# Patient Record
Sex: Female | Born: 1940
Health system: Southern US, Community
[De-identification: ages and names within clinical notes are randomized; demographics above are authoritative.]

## PROBLEM LIST (undated history)

## (undated) DIAGNOSIS — S62309A Unspecified fracture of unspecified metacarpal bone, initial encounter for closed fracture: Secondary | ICD-10-CM

## (undated) DIAGNOSIS — Z8614 Personal history of Methicillin resistant Staphylococcus aureus infection: Secondary | ICD-10-CM

## (undated) DIAGNOSIS — I219 Acute myocardial infarction, unspecified: Secondary | ICD-10-CM

## (undated) DIAGNOSIS — D8989 Other specified disorders involving the immune mechanism, not elsewhere classified: Secondary | ICD-10-CM

## (undated) DIAGNOSIS — F419 Anxiety disorder, unspecified: Secondary | ICD-10-CM

## (undated) DIAGNOSIS — R1310 Dysphagia, unspecified: Secondary | ICD-10-CM

## (undated) DIAGNOSIS — L568 Other specified acute skin changes due to ultraviolet radiation: Secondary | ICD-10-CM

## (undated) DIAGNOSIS — Z800 Family history of malignant neoplasm of digestive organs: Secondary | ICD-10-CM

## (undated) DIAGNOSIS — Z8719 Personal history of other diseases of the digestive system: Secondary | ICD-10-CM

## (undated) DIAGNOSIS — K219 Gastro-esophageal reflux disease without esophagitis: Secondary | ICD-10-CM

## (undated) DIAGNOSIS — E079 Disorder of thyroid, unspecified: Secondary | ICD-10-CM

## (undated) DIAGNOSIS — M19041 Primary osteoarthritis, right hand: Secondary | ICD-10-CM

## (undated) DIAGNOSIS — G709 Myoneural disorder, unspecified: Secondary | ICD-10-CM

## (undated) DIAGNOSIS — Q245 Malformation of coronary vessels: Secondary | ICD-10-CM

## (undated) DIAGNOSIS — M19042 Primary osteoarthritis, left hand: Secondary | ICD-10-CM

## (undated) DIAGNOSIS — G8929 Other chronic pain: Secondary | ICD-10-CM

## (undated) DIAGNOSIS — M3501 Sicca syndrome with keratoconjunctivitis: Secondary | ICD-10-CM

## (undated) DIAGNOSIS — F32A Depression, unspecified: Secondary | ICD-10-CM

## (undated) DIAGNOSIS — Z8639 Personal history of other endocrine, nutritional and metabolic disease: Secondary | ICD-10-CM

## (undated) DIAGNOSIS — Q249 Congenital malformation of heart, unspecified: Secondary | ICD-10-CM

## (undated) DIAGNOSIS — I4891 Unspecified atrial fibrillation: Secondary | ICD-10-CM

## (undated) DIAGNOSIS — I10 Essential (primary) hypertension: Secondary | ICD-10-CM

## (undated) DIAGNOSIS — IMO0002 Reserved for concepts with insufficient information to code with codable children: Secondary | ICD-10-CM

## (undated) DIAGNOSIS — C50919 Malignant neoplasm of unspecified site of unspecified female breast: Secondary | ICD-10-CM

## (undated) DIAGNOSIS — R197 Diarrhea, unspecified: Secondary | ICD-10-CM

## (undated) DIAGNOSIS — K1379 Other lesions of oral mucosa: Secondary | ICD-10-CM

## (undated) DIAGNOSIS — R51 Headache: Secondary | ICD-10-CM

## (undated) DIAGNOSIS — I480 Paroxysmal atrial fibrillation: Secondary | ICD-10-CM

## (undated) DIAGNOSIS — R519 Headache, unspecified: Secondary | ICD-10-CM

## (undated) DIAGNOSIS — F329 Major depressive disorder, single episode, unspecified: Secondary | ICD-10-CM

## (undated) DIAGNOSIS — Z9889 Other specified postprocedural states: Secondary | ICD-10-CM

## (undated) DIAGNOSIS — I7300 Raynaud's syndrome without gangrene: Secondary | ICD-10-CM

## (undated) DIAGNOSIS — K625 Hemorrhage of anus and rectum: Secondary | ICD-10-CM

## (undated) DIAGNOSIS — M8589 Other specified disorders of bone density and structure, multiple sites: Secondary | ICD-10-CM

## (undated) DIAGNOSIS — M1990 Unspecified osteoarthritis, unspecified site: Secondary | ICD-10-CM

## (undated) DIAGNOSIS — I2510 Atherosclerotic heart disease of native coronary artery without angina pectoris: Secondary | ICD-10-CM

## (undated) HISTORY — DX: Unspecified atrial fibrillation: I48.91

## (undated) HISTORY — DX: Malformation of coronary vessels: Q24.5

## (undated) HISTORY — DX: Personal history of other endocrine, nutritional and metabolic disease: Z86.39

## (undated) HISTORY — PX: COLONOSCOPY: SHX174

## (undated) HISTORY — DX: Headache, unspecified: R51.9

## (undated) HISTORY — PX: UPPER GASTROINTESTINAL ENDOSCOPY: SHX188

## (undated) HISTORY — DX: Family history of malignant neoplasm of digestive organs: Z80.0

## (undated) HISTORY — PX: OTHER SURGICAL HISTORY: SHX169

## (undated) HISTORY — DX: Other specified acute skin changes due to ultraviolet radiation: L56.8

## (undated) HISTORY — DX: Essential (primary) hypertension: I10

## (undated) HISTORY — DX: Other specified disorders involving the immune mechanism, not elsewhere classified: D89.89

## (undated) HISTORY — DX: Other specified disorders of bone density and structure, multiple sites: M85.89

## (undated) HISTORY — DX: Reserved for concepts with insufficient information to code with codable children: IMO0002

## (undated) HISTORY — DX: Malignant neoplasm of unspecified site of unspecified female breast: C50.919

## (undated) HISTORY — DX: Depression, unspecified: F32.A

## (undated) HISTORY — DX: Major depressive disorder, single episode, unspecified: F32.9

## (undated) HISTORY — DX: Other specified postprocedural states: Z98.890

## (undated) HISTORY — DX: Personal history of Methicillin resistant Staphylococcus aureus infection: Z86.14

## (undated) HISTORY — DX: Acute myocardial infarction, unspecified: I21.9

## (undated) HISTORY — DX: Headache: R51

## (undated) HISTORY — DX: Other lesions of oral mucosa: K13.79

## (undated) HISTORY — DX: Personal history of other diseases of the digestive system: Z87.19

## (undated) HISTORY — DX: Gastro-esophageal reflux disease without esophagitis: K21.9

## (undated) HISTORY — DX: Primary osteoarthritis, left hand: M19.042

## (undated) HISTORY — DX: Unspecified osteoarthritis, unspecified site: M19.90

## (undated) HISTORY — DX: Primary osteoarthritis, right hand: M19.041

## (undated) HISTORY — DX: Paroxysmal atrial fibrillation: I48.0

## (undated) HISTORY — DX: Unspecified fracture of unspecified metacarpal bone, initial encounter for closed fracture: S62.309A

## (undated) HISTORY — DX: Sicca syndrome with keratoconjunctivitis: M35.01

## (undated) HISTORY — PX: TIBIA FRACTURE SURGERY: SHX806

## (undated) HISTORY — DX: Disorder of thyroid, unspecified: E07.9

## (undated) HISTORY — PX: APPENDECTOMY: SHX54

## (undated) HISTORY — DX: Dysphagia, unspecified: R13.10

## (undated) HISTORY — DX: Other chronic pain: G89.29

## (undated) HISTORY — DX: Raynaud's syndrome without gangrene: I73.00

## (undated) HISTORY — DX: Hemorrhage of anus and rectum: K62.5

## (undated) HISTORY — DX: Atherosclerotic heart disease of native coronary artery without angina pectoris: I25.10

## (undated) HISTORY — DX: Diarrhea, unspecified: R19.7

## (undated) HISTORY — DX: Anxiety disorder, unspecified: F41.9

## (undated) HISTORY — PX: CHOLECYSTECTOMY: SHX55

## (undated) HISTORY — PX: WRIST FRACTURE SURGERY: SHX121

## (undated) HISTORY — DX: Congenital malformation of heart, unspecified: Q24.9

## (undated) HISTORY — DX: Myoneural disorder, unspecified: G70.9

---

## 1988-07-26 HISTORY — PX: PARTIAL HYSTERECTOMY: SHX80

## 1998-02-03 ENCOUNTER — Other Ambulatory Visit: Admission: RE | Admit: 1998-02-03 | Discharge: 1998-02-03 | Payer: Self-pay | Admitting: Hematology and Oncology

## 1998-02-24 ENCOUNTER — Ambulatory Visit (HOSPITAL_BASED_OUTPATIENT_CLINIC_OR_DEPARTMENT_OTHER): Admission: RE | Admit: 1998-02-24 | Discharge: 1998-02-24 | Payer: Self-pay | Admitting: Plastic Surgery

## 1998-03-13 ENCOUNTER — Inpatient Hospital Stay (HOSPITAL_COMMUNITY): Admission: AD | Admit: 1998-03-13 | Discharge: 1998-03-20 | Payer: Self-pay | Admitting: Plastic Surgery

## 1998-04-30 ENCOUNTER — Other Ambulatory Visit: Admission: RE | Admit: 1998-04-30 | Discharge: 1998-04-30 | Payer: Self-pay | Admitting: Obstetrics & Gynecology

## 1998-11-10 ENCOUNTER — Ambulatory Visit (HOSPITAL_COMMUNITY): Admission: RE | Admit: 1998-11-10 | Discharge: 1998-11-10 | Payer: Self-pay | Admitting: Obstetrics & Gynecology

## 1999-03-06 ENCOUNTER — Ambulatory Visit (HOSPITAL_BASED_OUTPATIENT_CLINIC_OR_DEPARTMENT_OTHER): Admission: RE | Admit: 1999-03-06 | Discharge: 1999-03-06 | Payer: Self-pay | Admitting: Plastic Surgery

## 1999-05-19 ENCOUNTER — Other Ambulatory Visit: Admission: RE | Admit: 1999-05-19 | Discharge: 1999-05-19 | Payer: Self-pay | Admitting: Obstetrics & Gynecology

## 2000-06-03 ENCOUNTER — Other Ambulatory Visit: Admission: RE | Admit: 2000-06-03 | Discharge: 2000-06-03 | Payer: Self-pay | Admitting: Obstetrics & Gynecology

## 2000-08-14 ENCOUNTER — Inpatient Hospital Stay (HOSPITAL_COMMUNITY): Admission: EM | Admit: 2000-08-14 | Discharge: 2000-08-17 | Payer: Self-pay | Admitting: *Deleted

## 2000-08-14 ENCOUNTER — Encounter: Payer: Self-pay | Admitting: Emergency Medicine

## 2001-05-09 ENCOUNTER — Ambulatory Visit (HOSPITAL_COMMUNITY): Admission: RE | Admit: 2001-05-09 | Discharge: 2001-05-09 | Payer: Self-pay | Admitting: Neurology

## 2001-05-12 ENCOUNTER — Encounter: Payer: Self-pay | Admitting: Gastroenterology

## 2001-05-12 ENCOUNTER — Ambulatory Visit (HOSPITAL_COMMUNITY): Admission: RE | Admit: 2001-05-12 | Discharge: 2001-05-12 | Payer: Self-pay | Admitting: Gastroenterology

## 2001-05-12 ENCOUNTER — Encounter (INDEPENDENT_AMBULATORY_CARE_PROVIDER_SITE_OTHER): Payer: Self-pay | Admitting: *Deleted

## 2001-07-12 ENCOUNTER — Other Ambulatory Visit: Admission: RE | Admit: 2001-07-12 | Discharge: 2001-07-12 | Payer: Self-pay | Admitting: Obstetrics & Gynecology

## 2003-08-19 ENCOUNTER — Other Ambulatory Visit: Admission: RE | Admit: 2003-08-19 | Discharge: 2003-08-19 | Payer: Self-pay | Admitting: Obstetrics & Gynecology

## 2004-03-18 ENCOUNTER — Emergency Department (HOSPITAL_COMMUNITY): Admission: EM | Admit: 2004-03-18 | Discharge: 2004-03-18 | Payer: Self-pay | Admitting: Emergency Medicine

## 2005-02-08 ENCOUNTER — Ambulatory Visit: Payer: Self-pay | Admitting: Gastroenterology

## 2005-02-18 ENCOUNTER — Ambulatory Visit: Payer: Self-pay | Admitting: Gastroenterology

## 2005-02-26 ENCOUNTER — Ambulatory Visit: Payer: Self-pay | Admitting: Gastroenterology

## 2005-02-26 ENCOUNTER — Encounter (INDEPENDENT_AMBULATORY_CARE_PROVIDER_SITE_OTHER): Payer: Self-pay | Admitting: *Deleted

## 2005-02-26 ENCOUNTER — Encounter (INDEPENDENT_AMBULATORY_CARE_PROVIDER_SITE_OTHER): Payer: Self-pay | Admitting: Specialist

## 2005-04-01 ENCOUNTER — Ambulatory Visit: Payer: Self-pay | Admitting: Gastroenterology

## 2005-08-27 ENCOUNTER — Other Ambulatory Visit: Admission: RE | Admit: 2005-08-27 | Discharge: 2005-08-27 | Payer: Self-pay | Admitting: Obstetrics & Gynecology

## 2006-05-31 ENCOUNTER — Encounter: Admission: RE | Admit: 2006-05-31 | Discharge: 2006-05-31 | Payer: Self-pay | Admitting: Internal Medicine

## 2006-06-06 ENCOUNTER — Encounter: Admission: RE | Admit: 2006-06-06 | Discharge: 2006-06-06 | Payer: Self-pay | Admitting: Internal Medicine

## 2006-06-06 ENCOUNTER — Other Ambulatory Visit: Admission: RE | Admit: 2006-06-06 | Discharge: 2006-06-06 | Payer: Self-pay | Admitting: Interventional Radiology

## 2006-06-06 ENCOUNTER — Encounter (INDEPENDENT_AMBULATORY_CARE_PROVIDER_SITE_OTHER): Payer: Self-pay | Admitting: Specialist

## 2007-11-22 ENCOUNTER — Encounter: Admission: RE | Admit: 2007-11-22 | Discharge: 2007-11-22 | Payer: Self-pay | Admitting: Surgery

## 2009-05-02 ENCOUNTER — Ambulatory Visit: Payer: Self-pay | Admitting: Gastroenterology

## 2009-05-02 DIAGNOSIS — R1310 Dysphagia, unspecified: Secondary | ICD-10-CM

## 2009-05-02 DIAGNOSIS — C50919 Malignant neoplasm of unspecified site of unspecified female breast: Secondary | ICD-10-CM | POA: Insufficient documentation

## 2009-05-02 DIAGNOSIS — I2510 Atherosclerotic heart disease of native coronary artery without angina pectoris: Secondary | ICD-10-CM

## 2009-05-02 HISTORY — DX: Dysphagia, unspecified: R13.10

## 2009-05-02 HISTORY — DX: Atherosclerotic heart disease of native coronary artery without angina pectoris: I25.10

## 2009-05-02 HISTORY — DX: Malignant neoplasm of unspecified site of unspecified female breast: C50.919

## 2009-05-13 ENCOUNTER — Ambulatory Visit: Payer: Self-pay | Admitting: Gastroenterology

## 2009-05-13 ENCOUNTER — Encounter: Payer: Self-pay | Admitting: Gastroenterology

## 2009-05-20 ENCOUNTER — Encounter: Payer: Self-pay | Admitting: Gastroenterology

## 2010-12-11 NOTE — H&P (Signed)
[place]. [facility]  Patient:    Alice Medina, Alice Medina                     MRN: [id number] Adm. Date:  08/14/00 Attending:  Darci Needle, M.D. CC:         Ammie Dalton, M.D.  Darden Palmer., M.D.  Thomas A. Patty Sermons, M.D.   History and Physical  REASON FOR ADMISSION:  Atrial fibrillation.  HISTORY OF PRESENT ILLNESS:  The patient is age 70 and gives a history of an episode of rapid irregular heart rate lasting approximately one hour, a week ago. This recurred today and has lasted now for several hours. When it occurs it causes her to have tightness in the chest. She feels somewhat weak and out of energy. When she lays flat she feels okay.  She has a history of PAT in 1992. There is also a history of bilateral breast cancer, and myocardial infarction (see below).  ALLERGIES:  SEPTRA and CODEINE.  MEDICATIONS:  1. Atenolol 50 mg one half tablet p.o. b.i.d.  2. Triamterene HCTZ 37.5/25 mg one per day.  3. Ecotrin one per day.  4. Paxil 20 mg one half tablet per day.  5. Lorazepam 20 mg one half tablet q.h.s.  6. Lipitor 10 mg p.o. q.d.  7. Premarin 0.625 mg per day.  8. Fosamax 70 mg one per week.  9. Calcium with vitamin D 600 mg per day. 10. Multivitamin one per day. 11. Tylenol Extra Strength p.r.n. 12. Ibuprofen p.r.n. 13. Darvocet p.r.n.  HABITS:  Does not smoke or drink.  SIGNIFICANT PAST MEDICAL PROBLEMS:  1. History of PAT in 1982.  2. History of hypertension.  3. Osteoporosis.  4. Myocardial infarction due to coronary spasm 1990 (this is a historical     diagnosis).  5. Hypercholesterolemia.  6. Duodenal rupture as a complication of ERCP requiring surgery.  7. Status post cholecystectomy.  8. Status post hysterectomy.  9. Breast cancer and status post bilateral mastectomy and chemotherapy in     1993.  FAMILY HISTORY:  Noncontributory.  REVIEW OF SYSTEMS:  Decreased energy and blurred vision, otherwise  no complaints.  PHYSICAL EXAMINATION:  VITAL SIGNS:  Blood pressure 110/70, heart rate between 70 and 100 with intermittent bursts of normal sinus rhythm (one to two beats) followed by atrial fibrillation.  SKIN:  Clear. No pallor is noted.  HEENT:  Pupils equal and reactive to light.  NECK:  No JVD or carotid bruits. Thyroid not palpable.  LUNGS:  Clear to auscultation and percussion.  CARDIAC:  Normal. No murmur, rub, click or gallops heard. An irregular rhythm is noted.  ABDOMEN:  Soft. Liver and spleen not palpable. Bowel sounds are normal.  EXTREMITIES:  No edema. Pulses are 2+ and symmetric in upper and lower extremities. Femoral pulses are 2+.  NEUROLOGIC:  The patient is alert, oriented x 3. No motor or sensory deficits noted.  EKG revealed atrial fibrillation, nonspecific ST abnormality, occasional sinus beats predominantly atrial fibrillation.  Other laboratory data is pending.  ASSESSMENT:  1. Paroxysmal atrial fibrillation with intermittent rapid ventricular     response.  2. History of paroxysmal atrial tachycardia.  3. History of coronary artery spasm causing myocardial infarction in 1990.  4. Bilateral breast cancer status post mastectomies and chemotherapy.  5. Hypertension.  PLAN:  1. IV Cardizem.  2. Will start Betapace in the a.m. and allow Atenolol to wear off.  3. A 2-D echocardiogram  to assess for pericardial effusion, structural     cardiac abnormality. Will test for hyperthyroidism with TSH. DD:  08/14/00 TD:  08/14/00 Job: [id number]

## 2010-12-11 NOTE — Discharge Summary (Signed)
[place]. [facility]  Patient:    Alice Medina, Alice Medina                     MRN: [id number] Adm. Date:  [id number] Disc. Date: 08/17/00 Attending:  Rudean Hitt Dictator:   Jennet Maduro Earl Gala, R.N., A.N.P. CC:         Ammie Dalton, M.D.  Darden Palmer., M.D.  Celso Sickle, M.D.   Discharge Summary  PRIMARY DISCHARGE DIAGNOSIS:  Paroxysmal atrial fibrillation with initiation of Betapace.  SECONDARY DISCHARGE DIAGNOSES: 1. History of paroxysmal atrial tachycardia in 1982. 2. Hypertension. 3. Osteoporosis. 4. Previous myocardial infarction due to coronary vasospasm in 1990    (historical diagnoses). 5. Hypercholesterolemia currently on Lipitor.  HISTORY OF PRESENT ILLNESS:  Ms. Faro is a very pleasant 70 year old white female who comes to the emergency room on August 14, 2000, with a history of a rapid irregular heartbeat that had been occurring for approximately one hour a week ago. She had recurrence on the day of admission which was persistent and was associated with chest tightness. She was subsequently noted to be in atrial fibrillation with a rapid ventricular response.  She was placed on IV Cardizem and subsequently admitted for further evaluation.  Please see the dictated history and physical per Celso Sickle, M.D., for further patient presentation and profile.  LABORATORY DATA ON ADMISSION:  A 12-lead electrocardiogram showing atrial fibrillation with nonspecific ST abnormality.  TSH normal at 4.83. White count 3.6, hemoglobin 12, hematocrit 35, platelets 185.  PT and PTT were unremarkable.  Chemistries revealed sodium 137, potassium 3.5, chloride 99, CO2 31, BUN 20, creatinine 1.0 and glucose 105. Cardiac enzyme was negative x 1.  Troponin I was negative x 1.  Urinalysis was basically unremarkable.  Chest x-ray showed COPD with no active disease.  HOSPITAL COURSE:  The patient was admitted to  telemetry.  She was initially placed on IV Cardizem but converted shortly thereafter to sinus bradycardia. She did have a few pauses just prior to this.  She subsequently remained in a sinus bradycardia/normal sinus rhythm throughout the remainder of her hospitalization with just short runs of atrial fibrillation.  There were no reoccurrences of pauses noted.  She was subsequently placed on Betapace and her atenolol was discontinued.  She had a 2D echocardiogram that was reviewed by Maisie Fus A. Brackbill, M.D., which showed normal LV systolic function. Ejection fraction estimated to be in the 55 to 65% range. There was trivial aortic valvular regurgitation with no evidence of left ventricular regional wall motion abnormalities.  She continued to do quite well throughout the remainder of her hospitalization.  She is tolerating Betapace without problems and today on August 17, 2000, she is felt to be stable for discharge.  DISCHARGE CONDITION:  Stable.  DISCHARGE MEDICATIONS:  She will resume all of her home medications that she was taking prior to coming to the hospital which include Lipitor, aspirin, Paxil, Maxzide, Premarin, Fosamax, multivitamins and calcium.  We will be stopping her atenolol and she was placed on Betapace 80 mg b.i.d.  ACTIVITY:  She may gradually resume.  DIET:  Low fat, decreased caffeine.  SPECIAL INSTRUCTIONS:  She is to call our office for any reoccurrence of problems otherwise will ask her to see W. Viann Fish, Montez Hageman., M.D., in approximately seven to 10 days for an office visit and follow-up EKG.  She is asked to call the office to schedule that appointment.  DD:  08/17/00 TD:  08/17/00 Job: [id number]

## 2011-05-31 ENCOUNTER — Encounter: Payer: Self-pay | Admitting: Gastroenterology

## 2011-06-22 ENCOUNTER — Encounter: Payer: Self-pay | Admitting: Gastroenterology

## 2011-06-22 ENCOUNTER — Ambulatory Visit (INDEPENDENT_AMBULATORY_CARE_PROVIDER_SITE_OTHER): Payer: Medicare Other | Admitting: Gastroenterology

## 2011-06-22 DIAGNOSIS — R1310 Dysphagia, unspecified: Secondary | ICD-10-CM

## 2011-06-22 DIAGNOSIS — Z800 Family history of malignant neoplasm of digestive organs: Secondary | ICD-10-CM | POA: Insufficient documentation

## 2011-06-22 DIAGNOSIS — K625 Hemorrhage of anus and rectum: Secondary | ICD-10-CM

## 2011-06-22 HISTORY — DX: Family history of malignant neoplasm of digestive organs: Z80.0

## 2011-06-22 HISTORY — DX: Hemorrhage of anus and rectum: K62.5

## 2011-06-22 MED ORDER — PEG-KCL-NACL-NASULF-NA ASC-C 100 G PO SOLR: 1.0000 | Freq: Once | ORAL | Status: DC

## 2011-06-22 NOTE — Patient Instructions (Signed)
Your Endoscopy is scheduled on 06/24/2011 at 4pm Your Colonoscopy is scheduled on 07/12/2011 at 4pm Your MoviPrep has been sent to your pharmacy Separate instructions given

## 2011-06-22 NOTE — Assessment & Plan Note (Signed)
Minimal rectal bleeding is likely related to hemorrhoids.  Recommendations #1 Anusol suppositories as needed

## 2011-06-22 NOTE — Progress Notes (Signed)
History of Present Illness: Alice Medina is a pleasant 70 year old white female with history of esophageal stricture, atrial fibrillation and breast cancer, referred at the request of Dr. Timothy Lasso for evaluation of dysphagia. She's had dysphagia to solids and, to a lesser extent, liquids. She underwent dilatation of esophageal stricture in 2006. She is also complaining of minimal rectal bleeding consisting of  on the tissue and occasionally coating the stool, especially after a constipated stool. Colonoscopy 2006 was negative. Family history is pertinent for a brother who had colon cancer.  Patient has history of breast cancer. Family history is pertinent for breast cancer in her mother, sister, grandmother and grandmothers sisters. She tested negative for the [facility] gene.    Past Medical History  Diagnosis Date  . Depression   . Cardiac arrhythmia due to congenital heart disease   . Atrial fibrillation   . Breast cancer   . Chronic headaches   . Status post dilation of esophageal narrowing    Past Surgical History  Procedure Date  . Appendectomy   . Cholecystectomy   . Partial hysterectomy 1990  . Double mastectomy    family history includes Breast cancer in her mother; Colon cancer in her brother; Heart disease in her brother; and Pseudochol deficiency in her other. Current Outpatient Prescriptions  Medication Sig Dispense Refill  . aspirin 81 MG tablet Take 81 mg by mouth daily.        . cholecalciferol (VITAMIN D) 1000 UNITS tablet Take 2,000 Units by mouth daily.        . cyclobenzaprine (FLEXERIL) 10 MG tablet Take 10 mg by mouth 3 (three) times daily as needed.        . cycloSPORINE (RESTASIS) 0.05 % ophthalmic emulsion 1 drop 2 (two) times daily.        . hydroxychloroquine (PLAQUENIL) 200 MG tablet Take 200 mg by mouth daily. 1/2 tab daily       . ibuprofen (ADVIL,MOTRIN) 100 MG chewable tablet Chew 100 mg by mouth as needed.        Marland Kitchen levothyroxine (SYNTHROID, LEVOTHROID) 50 MCG  tablet Take 50 mcg by mouth daily.        Marland Kitchen LORazepam (ATIVAN) 2 MG tablet Take 2 mg by mouth every 6 (six) hours as needed.        . Multiple Vitamins-Minerals (MULTIVITAMIN WITH MINERALS) tablet Take 1 tablet by mouth daily.        . naproxen sodium (ANAPROX) 220 MG tablet Take 220 mg by mouth 2 (two) times daily with a meal.        . PARoxetine (PAXIL) 40 MG tablet Take 40 mg by mouth every morning.        . raloxifene (EVISTA) 60 MG tablet Take 60 mg by mouth daily. Four times a week       . sotalol (BETAPACE) 160 MG tablet Take 320 mg by mouth 2 (two) times daily.        . traMADol (ULTRAM) 50 MG tablet Take 50 mg by mouth every 6 (six) hours as needed. Maximum dose= 8 tablets per day       . triamterene-hydrochlorothiazide (DYAZIDE) 37.5-25 MG per capsule Take 1 capsule by mouth every morning.         Allergies as of 06/22/2011 - Review Complete 06/22/2011  Allergen Reaction Noted  . Codeine    . Sulfamethoxazole w/trimethoprim      reports that she has never smoked. She has never used smokeless tobacco. She reports that she  drinks alcohol. She reports that she does not use illicit drugs.     Review of Systems: Pertinent positive and negative review of systems were noted in the above HPI section. All other review of systems were otherwise negative.  Vital signs were reviewed in today's medical record Physical Exam: General: Well developed , well nourished, no acute distress Head: Normocephalic and atraumatic Eyes:  sclerae anicteric, EOMI Ears: Normal auditory acuity Mouth: No deformity or lesions Neck: Supple, no masses or thyromegaly Lungs: Clear throughout to auscultation Heart: Regular rate and rhythm; no murmurs, rubs or bruits Abdomen: Soft, non tender and non distended. No masses, hepatosplenomegaly or hernias noted. Normal Bowel sounds Rectal: There are no external lesions Musculoskeletal: Symmetrical with no gross deformities  Skin: No lesions on visible  extremities Pulses:  Normal pulses noted Extremities: No clubbing, cyanosis, edema or deformities noted Neurological: Alert oriented x 4, grossly nonfocal Cervical Nodes:  No significant cervical adenopathy Inguinal Nodes: No significant inguinal adenopathy Psychological:  Alert and cooperative. Normal mood and affect

## 2011-06-22 NOTE — Assessment & Plan Note (Signed)
This is likely due to a recurrent peptic stricture.  Recommendations #1 upper endoscopy with dilatation as needed

## 2011-06-22 NOTE — Assessment & Plan Note (Signed)
Patient is due for colonoscopy.

## 2011-06-24 ENCOUNTER — Encounter: Payer: Self-pay | Admitting: Gastroenterology

## 2011-06-24 ENCOUNTER — Ambulatory Visit (AMBULATORY_SURGERY_CENTER): Payer: Medicare Other | Admitting: Gastroenterology

## 2011-06-24 VITALS — BP 186/93 | HR 61 | Temp 97.5°F | Resp 20 | Ht 67.0 in | Wt 156.0 lb

## 2011-06-24 DIAGNOSIS — R1310 Dysphagia, unspecified: Secondary | ICD-10-CM

## 2011-06-24 DIAGNOSIS — K2940 Chronic atrophic gastritis without bleeding: Secondary | ICD-10-CM

## 2011-06-24 DIAGNOSIS — K2970 Gastritis, unspecified, without bleeding: Secondary | ICD-10-CM

## 2011-06-24 DIAGNOSIS — K222 Esophageal obstruction: Secondary | ICD-10-CM

## 2011-06-24 MED ORDER — FAMOTIDINE 20 MG PO TABS: 20.0000 mg | ORAL_TABLET | Freq: Every day | ORAL | Status: DC

## 2011-06-24 MED ORDER — SODIUM CHLORIDE 0.9 % IV SOLN: 500.0000 mL | INTRAVENOUS | Status: DC

## 2011-06-24 MED ORDER — FAMOTIDINE 40 MG PO TABS: 40.0000 mg | ORAL_TABLET | Freq: Every evening | ORAL | Status: DC

## 2011-06-24 NOTE — Progress Notes (Signed)
   Patient did not experience any of the following events: a burn prior to discharge; a fall within the facility; wrong site/side/patient/procedure/implant event; or a hospital transfer or hospital admission upon discharge from the facility. [phone]) Patient did not have preoperative order for IV antibiotic SSI prophylaxis. [phone])    16:47 Pt c/o of "heart going into atrial fib".  I ran a strip and the pt was sinus arrythmia.  P waves were present.  Pt was asympotmatic.  No chest pain, VVS, warm, dry and pink and alert..  Per the pt" I just have a act up".  Maw  I explained to let me know if any chest pain or any other sx noted. Maw  Dr. Arlyce Dice was paged. Maw  16:56 phone call from Dr. Selina Cooley, RN explained pt's rhythm change.  Per Dr. Arlyce Dice "that's fine".  No orders given at this time. Maw  17:15 Phone call to Dr. Arlyce Dice.  Pt's blood pressure kept creeping up.  See the list of b/p on the strips.  Dr. Arlyce Dice was advised.  He recommended the pt go to the E.R. if she has chest pain.  Per Dr. Arlyce Dice if no chest pain tonight, then contact her cardiologist [place].  I gave the pt's daughter rhythm strip and vs copies.  Also pt to take extra dyzide tonight when she gets home.  Pt and her daughter state they understand.  The pt said she has a automatic blood pressure machine at home and she will take her pressure tonight.  Pt's last b/p 173/89.  Ryhthm was sinus rhythm and occ. Pac.  Per Dr. Arlyce Dice, okay to d/c pt to home. maw

## 2011-06-24 NOTE — Patient Instructions (Signed)
See the picture page for your findings from your exam today.  Follow the green and blue discharge instruction sheets the rest of the day.  Resume your prior medications today. Please call if any questions or concerns.  

## 2011-06-25 ENCOUNTER — Telehealth: Payer: Self-pay | Admitting: *Deleted

## 2011-06-25 MED ORDER — FAMOTIDINE 40 MG PO TABS: 40.0000 mg | ORAL_TABLET | Freq: Every evening | ORAL | Status: DC

## 2011-06-25 NOTE — Telephone Encounter (Signed)
Follow up Call- Patient questions:  Do you have a fever, pain , or abdominal swelling? no Pain Score  0 *  Have you tolerated food without any problems? yes  Have you been able to return to your normal activities? yes  Do you have any questions about your discharge instructions: Diet   no Medications  no Follow up visit  no  Do you have questions or concerns about your Care? no  Actions: * If pain score is 4 or above: No action needed, pain <4.  Pt states that her pharmacy had that Dr. Arlyce Dice ordered Pepcid 20mg  daily, according to his report he ordered 40mg  daily.  Per Epic records, he did order 40mg  but rx resent to pharmacy

## 2011-07-12 ENCOUNTER — Other Ambulatory Visit: Payer: Self-pay | Admitting: Gastroenterology

## 2011-07-12 ENCOUNTER — Ambulatory Visit (AMBULATORY_SURGERY_CENTER): Payer: Medicare Other | Admitting: Gastroenterology

## 2011-07-12 ENCOUNTER — Encounter: Payer: Self-pay | Admitting: Gastroenterology

## 2011-07-12 DIAGNOSIS — K625 Hemorrhage of anus and rectum: Secondary | ICD-10-CM

## 2011-07-12 DIAGNOSIS — Z800 Family history of malignant neoplasm of digestive organs: Secondary | ICD-10-CM

## 2011-07-12 DIAGNOSIS — Z1211 Encounter for screening for malignant neoplasm of colon: Secondary | ICD-10-CM

## 2011-07-12 MED ORDER — SODIUM CHLORIDE 0.9 % IV SOLN: 500.0000 mL | INTRAVENOUS | Status: DC

## 2011-07-12 MED ORDER — DICLOFENAC-MISOPROSTOL 50-0.2 MG PO TABS: 1.0000 | ORAL_TABLET | Freq: Two times a day (BID) | ORAL | Status: DC

## 2011-07-12 NOTE — Op Note (Signed)
[facility] [address] [place], [place]  [address]  COLONOSCOPY PROCEDURE REPORT  PATIENT:  Alice, Medina  MR#:  [id number] BIRTHDATE:  01/07/1941, 70 yrs. old  GENDER:  female ENDOSCOPIST:  Barbette Hair. Arlyce Dice, MD REF. BY:  Creola Corn, M.D. PROCEDURE DATE:  07/12/2011 PROCEDURE:  Diagnostic Colonoscopy ASA CLASS:  Class II INDICATIONS:  Elevated Risk Screening, family history of colon cancer Sibling MEDICATIONS:   MAC sedation, administered by CRNA propofol 250mg IV  DESCRIPTION OF PROCEDURE:   After the risks benefits and alternatives of the procedure were thoroughly explained, informed consent was obtained.  Digital rectal exam was performed and revealed no abnormalities.   The LB [id number]-[id number] endoscope was introduced through the anus and advanced to the cecum, which was identified by both the appendix and ileocecal valve, without limitations.  The quality of the prep was good, using MoviPrep. The instrument was then slowly withdrawn as the colon was fully examined. <<PROCEDUREIMAGES>>  FINDINGS:  Internal Hemorrhoids were found (see image4).  A normal appearing cecum, ileocecal valve, and appendiceal orifice were identified. The ascending, hepatic flexure, transverse, splenic flexure, descending, sigmoid colon, and rectum appeared unremarkable (see image1 and image3).   Retroflexed views in the rectum revealed no abnormalities.    The time to cecum =  1) 3.75 minutes. The scope was then withdrawn in  1) 6.0  minutes from the cecum and the procedure completed. COMPLICATIONS:  None ENDOSCOPIC IMPRESSION: 1) Internal hemorrhoids 2) Normal colon RECOMMENDATIONS: 1) Given your significant family history of colon cancer, you should have a repeat colonoscopy in 5 years REPEAT EXAM:  In 5 year(s) for Colonoscopy.  ______________________________ Barbette Hair. Arlyce Dice, MD  CC:  n. eSIGNED:   Barbette Hair. Sultana Tierney at 07/12/2011 04:03 PM  Debbrah Alar,  [id number]

## 2011-07-12 NOTE — Patient Instructions (Signed)
Internal hemorrhoids today. Normal colon. Repeat this test in 5 yrs due to significant family history for colon cancer.

## 2011-07-12 NOTE — Progress Notes (Signed)
Patient ID: Alice Medina, female   DOB: Aug 09, 1940, 70 y.o.   MRN: [id number] Pt is c/o severe arthritic pain despite tylenol.  NSAIDS were d/ced b/c of gastric ulcers.  Will try arthrotec.  She was warned to call if she develop diarrhea.

## 2011-07-12 NOTE — Progress Notes (Signed)
Patient did not experience any of the following events: a burn prior to discharge; a fall within the facility; wrong site/side/patient/procedure/implant event; or a hospital transfer or hospital admission upon discharge from the facility. (G8907) Patient did not have preoperative order for IV antibiotic SSI prophylaxis. (G8918)  

## 2011-07-12 NOTE — Progress Notes (Signed)
PT STATED HAVING IRREG HEARTBEAT & HIGH BP DURING PROCEDURE & IN RECOVERY LAST MONTH WHEN HERE FOR EGD. DR Arlyce Dice MADE AWARE BY K OWEN RN SINCE PT WONDERING IF THIS HAD TO DO WITH PROPOFOL SHE RECEIVED. DR Arlyce Dice WILL DISCUSS WITH PT ON PROCEDURE RM. PT HAS NOT VISITED WITH A CARDIOLOGIST OR PRIMARY DR. Lyndal Pulley THAT EGD EPISODE.

## 2011-07-13 ENCOUNTER — Telehealth: Payer: Self-pay | Admitting: *Deleted

## 2011-07-13 NOTE — Telephone Encounter (Signed)

## 2011-10-05 ENCOUNTER — Other Ambulatory Visit: Payer: Self-pay | Admitting: Gastroenterology

## 2011-10-06 NOTE — Telephone Encounter (Signed)
Dr Arlyce Dice, This Patient needs a refill of Arthrotec.. Did you prescribe this for the patient? Do I refill or Deny

## 2012-03-12 ENCOUNTER — Other Ambulatory Visit: Payer: Self-pay | Admitting: Gastroenterology

## 2012-03-15 ENCOUNTER — Other Ambulatory Visit: Payer: Self-pay | Admitting: Gastroenterology

## 2012-03-15 ENCOUNTER — Telehealth: Payer: Self-pay | Admitting: *Deleted

## 2012-03-15 MED ORDER — DICLOFENAC-MISOPROSTOL 50-0.2 MG PO TBEC: 50.0000 mg | DELAYED_RELEASE_TABLET | Freq: Two times a day (BID) | ORAL | Status: DC

## 2012-03-15 NOTE — Telephone Encounter (Signed)
Done-see previous telephone note 

## 2012-03-15 NOTE — Telephone Encounter (Signed)
Called patient to inform that I would send her in a refill. But would have to discuss this medication with Dr Arlyce Dice when he comes back from vacation about any further refills

## 2012-06-20 ENCOUNTER — Ambulatory Visit
Admission: RE | Admit: 2012-06-20 | Discharge: 2012-06-20 | Disposition: A | Discharge status: Home / Self Care | Payer: Medicare Other | Source: Ambulatory Visit | Attending: Cardiology | Admitting: Cardiology

## 2012-06-20 ENCOUNTER — Other Ambulatory Visit: Payer: Self-pay | Admitting: Cardiology

## 2012-06-20 DIAGNOSIS — R0789 Other chest pain: Secondary | ICD-10-CM

## 2012-07-03 ENCOUNTER — Other Ambulatory Visit: Payer: Self-pay | Admitting: Cardiology

## 2012-07-03 DIAGNOSIS — R911 Solitary pulmonary nodule: Secondary | ICD-10-CM

## 2012-07-06 ENCOUNTER — Ambulatory Visit
Admission: RE | Admit: 2012-07-06 | Discharge: 2012-07-06 | Disposition: A | Discharge status: Home / Self Care | Payer: Medicare Other | Source: Ambulatory Visit | Attending: Cardiology | Admitting: Cardiology

## 2012-07-06 DIAGNOSIS — R911 Solitary pulmonary nodule: Secondary | ICD-10-CM

## 2012-07-06 MED ORDER — IOHEXOL 300 MG/ML  SOLN
75.0000 mL | Freq: Once | INTRAMUSCULAR | Status: AC | PRN
  Administered 2012-07-06: 75 mL via INTRAVENOUS

## 2013-05-28 ENCOUNTER — Encounter: Payer: Self-pay | Admitting: Gastroenterology

## 2013-05-28 ENCOUNTER — Ambulatory Visit (INDEPENDENT_AMBULATORY_CARE_PROVIDER_SITE_OTHER): Payer: Medicare Other | Admitting: Gastroenterology

## 2013-05-28 VITALS — BP 138/62 | HR 64 | Ht 67.0 in | Wt 161.0 lb

## 2013-05-28 DIAGNOSIS — R1310 Dysphagia, unspecified: Secondary | ICD-10-CM

## 2013-05-28 DIAGNOSIS — Z800 Family history of malignant neoplasm of digestive organs: Secondary | ICD-10-CM

## 2013-05-28 DIAGNOSIS — R197 Diarrhea, unspecified: Secondary | ICD-10-CM

## 2013-05-28 HISTORY — DX: Diarrhea, unspecified: R19.7

## 2013-05-28 NOTE — Patient Instructions (Signed)
You have been scheduled for a flexible sigmoidoscopy. Please follow the written instructions given to you at your visit today. If you use inhalers (even only as needed), please bring them with you on the day of your procedure.  

## 2013-05-28 NOTE — Assessment & Plan Note (Signed)
Symptoms are suggestive of IBS although entities such as microscopic colitis should be ruled out.  There is been no antibiotic exposure or travel history.  Recommendations #1 stool for lactoferrin, pathogen panel #2 sigmoidoscopy

## 2013-05-28 NOTE — Assessment & Plan Note (Signed)
Plan follow-up colonoscopy 2017 

## 2013-05-28 NOTE — Progress Notes (Signed)
History of Present Illness: This is a 72 year old white female referred for evaluation of diarrhea.  For years she has had intermittent bouts of diarrhea.  Over the past 4-5 months symptoms clearly have worsened.  She may have severe diarrheal episodes 3-4 times a week.  Symptoms are accompanied by extreme urgency and occasional incontinence.  Symptoms are not associated with any particular foods.  There has been no change in medications or travel history.  She denies rectal bleeding.  Colonoscopy in 2012 demonstrated internal hemorrhoids.  Family history is pertinent for brother with colon cancer.  She has a history of esophageal stricture.  She very occasionally has mild dysphagia to solids.    Past Medical History  Diagnosis Date  . Depression   . Cardiac arrhythmia due to congenital heart disease   . Atrial fibrillation   . Breast cancer   . Chronic headaches   . Status post dilation of esophageal narrowing   . Anxiety   . Arthritis   . GERD (gastroesophageal reflux disease)   . Hypertension   . Myocardial infarction   . Autoimmune disorder     SJORGREN'S  . Osteoporosis   . Thyroid disease   . Ulcer    Past Surgical History  Procedure Laterality Date  . Appendectomy    . Cholecystectomy    . Partial hysterectomy  1990  . Double mastectomy    . Colonoscopy    . Upper gastrointestinal endoscopy    . Tibia fracture surgery Left     with subsequent hardware removal  . Wrist fracture surgery Left   . Breast reconstuction      x 2   family history includes Breast cancer in her maternal grandmother, mother, and sister; Cancer in her father; Colon cancer in her brother; Heart disease in her brother; Heart failure in her father; Lung cancer in her mother; Pseudochol deficiency in her other. Current Outpatient Prescriptions  Medication Sig Dispense Refill  . aspirin 81 MG tablet Take 81 mg by mouth daily.        . cholecalciferol (VITAMIN D) 1000 UNITS tablet Take 1,000 Units by  mouth daily.       . cyclobenzaprine (FLEXERIL) 10 MG tablet Take 10 mg by mouth 3 (three) times daily as needed.        . cycloSPORINE (RESTASIS) 0.05 % ophthalmic emulsion 1 drop 2 (two) times daily.        . Diclofenac-Misoprostol 50-0.2 MG TBEC Take 50-200 mg by mouth 2 (two) times daily.  60 tablet  2  . DULoxetine (CYMBALTA) 60 MG capsule Take 60 mg by mouth daily.      . hydroxychloroquine (PLAQUENIL) 200 MG tablet Take 200 mg by mouth daily. 1/2 tab daily       . ibuprofen (ADVIL,MOTRIN) 100 MG chewable tablet Chew 100 mg by mouth as needed.        Marland Kitchen levothyroxine (SYNTHROID, LEVOTHROID) 50 MCG tablet Take 50 mcg by mouth daily.        Marland Kitchen LORazepam (ATIVAN) 2 MG tablet Take 2 mg by mouth at bedtime.       . Multiple Vitamins-Minerals (MULTIVITAMIN WITH MINERALS) tablet Take 1 tablet by mouth daily.        . raloxifene (EVISTA) 60 MG tablet Take 60 mg by mouth daily. Four times a week       . sotalol (BETAPACE) 160 MG tablet Take 160 mg by mouth 2 (two) times daily.       . traMADol (  ULTRAM) 50 MG tablet Take 50 mg by mouth every 6 (six) hours as needed. Maximum dose= 8 tablets per day      . triamterene-hydrochlorothiazide (DYAZIDE) 37.5-25 MG per capsule Take 1 capsule by mouth every morning.         Current Facility-Administered Medications  Medication Dose Route Frequency Provider Last Rate Last Dose  . 0.9 %  sodium chloride infusion  500 mL Intravenous Continuous Louis Meckel, MD       Allergies as of 05/28/2013 - Review Complete 05/28/2013  Allergen Reaction Noted  . Codeine    . Robinul [glycopyrrolate]  05/28/2013  . Sulfamethoxazole-trimethoprim      reports that she has never smoked. She has never used smokeless tobacco. She reports that she drinks alcohol. She reports that she does not use illicit drugs.     Review of Systems: Pertinent positive and negative review of systems were noted in the above HPI section. All other review of systems were otherwise  negative.  Vital signs were reviewed in today's medical record Physical Exam: General: Well developed , well nourished, no acute distress Skin: anicteric Head: Normocephalic and atraumatic Eyes:  sclerae anicteric, EOMI Ears: Normal auditory acuity Mouth: No deformity or lesions Neck: Supple, no masses or thyromegaly Lungs: Clear throughout to auscultation Heart: Regular rate and rhythm; no murmurs, rubs or bruits Abdomen: Soft, non tender and non distended. No masses, hepatosplenomegaly or hernias noted. Normal Bowel sounds Rectal:deferred Musculoskeletal: Symmetrical with no gross deformities  Skin: No lesions on visible extremities Pulses:  Normal pulses noted Extremities: No clubbing, cyanosis, edema or deformities noted Neurological: Alert oriented x 4, grossly nonfocal Cervical Nodes:  No significant cervical adenopathy Inguinal Nodes: No significant inguinal adenopathy Psychological:  Alert and cooperative. Normal mood and affect

## 2013-05-28 NOTE — Assessment & Plan Note (Addendum)
Symptoms are minimal and probably related to an early esophageal stricture.  Plan no therapy unless symptoms worsen.   

## 2013-05-29 ENCOUNTER — Other Ambulatory Visit: Payer: Self-pay

## 2013-05-29 DIAGNOSIS — R197 Diarrhea, unspecified: Secondary | ICD-10-CM

## 2013-05-29 DIAGNOSIS — R1310 Dysphagia, unspecified: Secondary | ICD-10-CM

## 2013-05-29 DIAGNOSIS — Z800 Family history of malignant neoplasm of digestive organs: Secondary | ICD-10-CM

## 2013-05-30 LAB — GASTROINTESTINAL PATHOGEN PANEL PCR
C. difficile Tox A/B, PCR: NEGATIVE
Campylobacter, PCR: NEGATIVE
E coli (STEC) stx1/stx2, PCR: NEGATIVE
Rotavirus A, PCR: NEGATIVE

## 2013-05-31 ENCOUNTER — Encounter: Payer: Self-pay | Admitting: Gastroenterology

## 2013-05-31 ENCOUNTER — Ambulatory Visit (AMBULATORY_SURGERY_CENTER): Payer: Medicare Other | Admitting: Gastroenterology

## 2013-05-31 VITALS — BP 121/69 | HR 49 | Temp 95.6°F | Resp 21 | Ht 67.0 in | Wt 161.0 lb

## 2013-05-31 DIAGNOSIS — R197 Diarrhea, unspecified: Secondary | ICD-10-CM

## 2013-05-31 DIAGNOSIS — D126 Benign neoplasm of colon, unspecified: Secondary | ICD-10-CM

## 2013-05-31 MED ORDER — SODIUM CHLORIDE 0.9 % IV SOLN: 500.0000 mL | INTRAVENOUS | Status: DC

## 2013-05-31 NOTE — Progress Notes (Signed)
Pt has had B mastectomy for breast CA and reconstruction.  She states she has been told it is ok to do IV sticks and BPs in either arm.  She states no lymph node involvement

## 2013-05-31 NOTE — Progress Notes (Signed)
Flex-sig, report to pacu rn, vss, bbs=clear

## 2013-05-31 NOTE — Patient Instructions (Signed)
YOU HAD AN ENDOSCOPIC PROCEDURE TODAY AT THE [facility]: Refer to the procedure report that was given to you for any specific questions about what was found during the examination.  If the procedure report does not answer your questions, please call your gastroenterologist to clarify.  If you requested that your care partner not be given the details of your procedure findings, then the procedure report has been included in a sealed envelope for you to review at your convenience later.  YOU SHOULD EXPECT: Some feelings of bloating in the abdomen. Passage of more gas than usual.  Walking can help get rid of the air that was put into your GI tract during the procedure and reduce the bloating. If you had a lower endoscopy (such as a colonoscopy or flexible sigmoidoscopy) you may notice spotting of blood in your stool or on the toilet paper. If you underwent a bowel prep for your procedure, then you may not have a normal bowel movement for a few days.  DIET: Your first meal following the procedure should be a light meal and then it is ok to progress to your normal diet.  A half-sandwich or bowl of soup is an example of a good first meal.  Heavy or fried foods are harder to digest and may make you feel nauseous or bloated.  Likewise meals heavy in dairy and vegetables can cause extra gas to form and this can also increase the bloating.  Drink plenty of fluids but you should avoid alcoholic beverages for 24 hours.  ACTIVITY: Your care partner should take you home directly after the procedure.  You should plan to take it easy, moving slowly for the rest of the day.  You can resume normal activity the day after the procedure however you should NOT DRIVE or use heavy machinery for 24 hours (because of the sedation medicines used during the test).    SYMPTOMS TO REPORT IMMEDIATELY: A gastroenterologist can be reached at any hour.  During normal business hours, 8:30 AM to 5:00 PM Monday through Friday,  call [phone].  After hours and on weekends, please call the GI answering service at [phone] who will take a message and have the physician on call contact you.   Following lower endoscopy (colonoscopy or flexible sigmoidoscopy):  Excessive amounts of blood in the stool  Significant tenderness or worsening of abdominal pains  Swelling of the abdomen that is new, acute  Fever of 100F or higher FOLLOW UP: If any biopsies were taken you will be contacted by phone or by letter within the next 1-3 weeks.  Call your gastroenterologist if you have not heard about the biopsies in 3 weeks.  Our staff will call the home number listed on your records the next business day following your procedure to check on you and address any questions or concerns that you may have at that time regarding the information given to you following your procedure. This is a courtesy call and so if there is no answer at the home number and we have not heard from you through the emergency physician on call, we will assume that you have returned to your regular daily activities without incident.  SIGNATURES/CONFIDENTIALITY: You and/or your care partner have signed paperwork which will be entered into your electronic medical record.  These signatures attest to the fact that that the information above on your After Visit Summary has been reviewed and is understood.  Full responsibility of the confidentiality of this discharge   information lies with you and/or your care-partner.   The 3rd floor nurse will call to set up a follow up appointment to see Dr. Arlyce Dice in the office for 2 weeks. Handouts were given to your care partner on hemorrhoids and a high fiber diet with liberal fluid intake. You may resume your current medications today. Please call if any questions or concerns.

## 2013-05-31 NOTE — Progress Notes (Signed)
No complains noted in the recovery room. Maw

## 2013-05-31 NOTE — Progress Notes (Signed)
Called to room to assist during endoscopic procedure.  Patient ID and intended procedure confirmed with present staff. Received instructions for my participation in the procedure from the performing physician.  

## 2013-05-31 NOTE — Op Note (Signed)
[facility] [address] [place], [address]   FLEXIBLE SIGMOIDOSCOPY PROCEDURE REPORT  PATIENT: Alice Medina, Alice Medina  MR#: [id number] BIRTHDATE: 05/06/1941 , 72  yrs. old GENDER: Female ENDOSCOPIST: Louis Meckel, MD REFERRED BY: Creola Corn, M.D. PROCEDURE DATE:  05/31/2013 PROCEDURE:   Sigmoidoscopy with biopsy ASA CLASS:   Class II INDICATIONS:unexplained diarrhea. MEDICATIONS: MAC sedation, administered by CRNA and propofol (Diprivan) 200mg  IV  DESCRIPTION OF PROCEDURE:   After the risks benefits and alternatives of the procedure were thoroughly explained, informed consent was obtained.  revealed no abnormalities of the rectum. The LB PFC-H190 [id number]  endoscope was introduced through the anus  and advanced to the descending colon (70cm),  No adverse events experienced.   The quality of the prep was excellent      .  The instrument was then slowly withdrawn as the mucosa was fully examined.       1.  COLON FINDINGS: Internal hemorrhoids were found. 2.  The colon mucosa was otherwise normal.  Random biopsies were taken every 10 cm to rule out microscopic colitis Retroflexed views revealed no abnormalities.    The scope was then withdrawn from the patient and the procedure terminated.  COMPLICATIONS: There were no complications.  ENDOSCOPIC IMPRESSION: 1.   Internal hemorrhoids 2.   The colon mucosa was otherwise normal  RECOMMENDATIONS: await biopsy results office visit 2 week   REPEAT EXAM:   _______________________________ eSignedLouis Meckel, MD 05/31/2013 10:09 AM   CC:

## 2013-06-01 ENCOUNTER — Telehealth: Payer: Self-pay

## 2013-06-01 NOTE — Telephone Encounter (Signed)
  Follow up Call-  Call back number 05/31/2013 07/12/2011 06/24/2011  Post procedure Call Back phone  # [phone]===OK TO LEAVE MESSAGE [id number]-O.K. TO LEAVE MESSAGE  Permission to leave phone message Yes - -     Patient questions:  Do you have a fever, pain , or abdominal swelling? no Pain Score  0 *  Have you tolerated food without any problems? yes  Have you been able to return to your normal activities? yes  Do you have any questions about your discharge instructions: Diet   no Medications  no Follow up visit  no  Do you have questions or concerns about your Care? no  Actions: * If pain score is 4 or above: No action needed, pain <4.

## 2013-06-06 ENCOUNTER — Encounter: Payer: Self-pay | Admitting: Gastroenterology

## 2013-06-25 ENCOUNTER — Ambulatory Visit (INDEPENDENT_AMBULATORY_CARE_PROVIDER_SITE_OTHER): Payer: Medicare Other | Admitting: Gastroenterology

## 2013-06-25 ENCOUNTER — Encounter: Payer: Self-pay | Admitting: Gastroenterology

## 2013-06-25 VITALS — BP 128/84 | HR 62 | Ht 67.0 in | Wt 161.4 lb

## 2013-06-25 DIAGNOSIS — R197 Diarrhea, unspecified: Secondary | ICD-10-CM

## 2013-06-25 MED ORDER — CHOLESTYRAMINE LIGHT 4 G PO PACK: PACK | ORAL | Status: DC

## 2013-06-25 NOTE — Patient Instructions (Signed)
Follow up in 4-6 weeks

## 2013-06-25 NOTE — Progress Notes (Signed)
History of Present Illness:  The patient has returned for followup of diarrhea.  Sigmoidoscopy was unrevealing.  Random biopsies were negative for microscopic colitis.  She has diarrhea several days a week.  She may go one to 2 days without a bowel movement.  At times she has extreme urgency with incontinence.  She tried holding Plavix would help without success.  She's been taking low-dose Arthrotec for severe joint pains.    Review of Systems: Pertinent positive and negative review of systems were noted in the above HPI section. All other review of systems were otherwise negative.    Current Medications, Allergies, Past Medical History, Past Surgical History, Family History and Social History were reviewed in [facility] electronic medical record  Vital signs were reviewed in today's medical record. Physical Exam: General: Well developed , well nourished, no acute distress

## 2013-06-25 NOTE — Assessment & Plan Note (Signed)
The patient suffers from moderately severe intermittent diarrhea.  This may be related to Arthrotec.  Alternatively, since may also be related to irritable bowel syndrome.  Recommendations #1 trial of cholestyramine daily with one pack daily.  She may increase it to up to 4 packs a day as needed.

## 2013-08-06 ENCOUNTER — Ambulatory Visit: Payer: Medicare Other | Admitting: Gastroenterology

## 2013-08-07 ENCOUNTER — Encounter: Payer: Self-pay | Admitting: Gastroenterology

## 2013-08-07 ENCOUNTER — Ambulatory Visit (INDEPENDENT_AMBULATORY_CARE_PROVIDER_SITE_OTHER): Payer: Medicare Other | Admitting: Gastroenterology

## 2013-08-07 VITALS — BP 132/86 | HR 82 | Ht 67.0 in | Wt 159.2 lb

## 2013-08-07 DIAGNOSIS — R197 Diarrhea, unspecified: Secondary | ICD-10-CM

## 2013-08-07 NOTE — Patient Instructions (Signed)
Follow-up in 6 weeks

## 2013-08-07 NOTE — Assessment & Plan Note (Signed)
Diarrhea is now intermittent and unpredictable.  I still suspect there may be certain triggers.  Recommendations #1 increase cholestyramine to one packet daily #2 patient was carefully instructed to take a dietary history when she has episodes of diarrhea

## 2013-08-07 NOTE — Progress Notes (Signed)
          History of Present Illness:  Alice Medina has returned for followup of diarrhea.  She's taking one half packet of cholestyramine daily.  She has noticed decreased frequency of diarrhea which consists of isolated episodes of explosive diarrhea with severe urgency.  On one occasion she had eaten milk with cereal several hours prior.  Otherwise, she is not aware of a specific trigger.  Generally she is feeling better.    Review of Systems: Pertinent positive and negative review of systems were noted in the above HPI section. All other review of systems were otherwise negative.    Current Medications, Allergies, Past Medical History, Past Surgical History, Family History and Social History were reviewed in [facility] record  Vital signs were reviewed in today's medical record. Physical Exam: General: Well developed , well nourished, no acute distress   See Assessment and Plan under Problem List

## 2013-09-19 ENCOUNTER — Encounter: Payer: Self-pay | Admitting: Gastroenterology

## 2013-09-19 ENCOUNTER — Ambulatory Visit (INDEPENDENT_AMBULATORY_CARE_PROVIDER_SITE_OTHER): Payer: Medicare Other | Admitting: Gastroenterology

## 2013-09-19 VITALS — BP 132/74 | HR 68 | Ht 67.0 in | Wt 159.0 lb

## 2013-09-19 DIAGNOSIS — R197 Diarrhea, unspecified: Secondary | ICD-10-CM

## 2013-09-19 DIAGNOSIS — R1310 Dysphagia, unspecified: Secondary | ICD-10-CM

## 2013-09-19 DIAGNOSIS — Z800 Family history of malignant neoplasm of digestive organs: Secondary | ICD-10-CM

## 2013-09-19 NOTE — Assessment & Plan Note (Signed)
Symptoms are under fairly good control on daily cholestyramine and calcium.  I still suspect that there may be a trigger causing diarrhea.  She was carefully instructed to take a dietary history when she has a severe episode of diarrhea.

## 2013-09-19 NOTE — Assessment & Plan Note (Signed)
Symptoms are minimal and probably related to an early esophageal stricture.  Plan no therapy unless symptoms worsen.

## 2013-09-19 NOTE — Patient Instructions (Signed)
Follow up as needed

## 2013-09-19 NOTE — Assessment & Plan Note (Signed)
Follow-up colonoscopy 2017 

## 2013-09-19 NOTE — Progress Notes (Signed)
          History of Present Illness:  Alice Medina has returned for followup of diarrhea.  On a regimen of calcium and cholestyramine (calcium in the past has caused constipation) her diarrheal symptoms are under good control.  Rarely has she had a couple days of diarrhea.  She is unaware of any specific triggers but she has not taken a food diary.    Review of Systems: Pertinent positive and negative review of systems were noted in the above HPI section. All other review of systems were otherwise negative.    Current Medications, Allergies, Past Medical History, Past Surgical History, Family History and Social History were reviewed in [facility] record  Vital signs were reviewed in today's medical record. Physical Exam: General: Well developed , well nourished, no acute distress Skin: anicteric   See Assessment and Plan under Problem List

## 2014-05-10 ENCOUNTER — Encounter: Payer: Self-pay | Admitting: Gastroenterology

## 2015-06-27 ENCOUNTER — Other Ambulatory Visit: Payer: Self-pay | Admitting: Internal Medicine

## 2015-06-27 DIAGNOSIS — E038 Other specified hypothyroidism: Secondary | ICD-10-CM

## 2015-07-01 ENCOUNTER — Ambulatory Visit
Admission: RE | Admit: 2015-07-01 | Discharge: 2015-07-01 | Disposition: A | Discharge status: Home / Self Care | Payer: Medicare Other | Source: Ambulatory Visit | Attending: Internal Medicine | Admitting: Internal Medicine

## 2015-07-01 DIAGNOSIS — E038 Other specified hypothyroidism: Secondary | ICD-10-CM

## 2015-07-09 ENCOUNTER — Other Ambulatory Visit: Payer: Self-pay | Admitting: Internal Medicine

## 2015-07-09 DIAGNOSIS — E041 Nontoxic single thyroid nodule: Secondary | ICD-10-CM

## 2015-07-30 ENCOUNTER — Ambulatory Visit
Admission: RE | Admit: 2015-07-30 | Discharge: 2015-07-30 | Disposition: A | Discharge status: Home / Self Care | Payer: Medicare Other | Source: Ambulatory Visit | Attending: Internal Medicine | Admitting: Internal Medicine

## 2015-07-30 ENCOUNTER — Other Ambulatory Visit (HOSPITAL_COMMUNITY)
Admission: RE | Admit: 2015-07-30 | Discharge: 2015-07-30 | Disposition: A | Discharge status: Home / Self Care | Payer: Medicare Other | Source: Ambulatory Visit | Attending: Interventional Radiology | Admitting: Interventional Radiology

## 2015-07-30 DIAGNOSIS — E041 Nontoxic single thyroid nodule: Secondary | ICD-10-CM | POA: Insufficient documentation

## 2015-12-26 ENCOUNTER — Other Ambulatory Visit: Payer: Self-pay | Admitting: Internal Medicine

## 2015-12-26 DIAGNOSIS — R945 Abnormal results of liver function studies: Secondary | ICD-10-CM

## 2015-12-26 DIAGNOSIS — R7989 Other specified abnormal findings of blood chemistry: Secondary | ICD-10-CM

## 2015-12-30 ENCOUNTER — Ambulatory Visit
Admission: RE | Admit: 2015-12-30 | Discharge: 2015-12-30 | Disposition: A | Discharge status: Home / Self Care | Payer: Medicare Other | Source: Ambulatory Visit | Attending: Internal Medicine | Admitting: Internal Medicine

## 2015-12-30 DIAGNOSIS — R7989 Other specified abnormal findings of blood chemistry: Secondary | ICD-10-CM

## 2015-12-30 DIAGNOSIS — R945 Abnormal results of liver function studies: Secondary | ICD-10-CM

## 2016-02-11 ENCOUNTER — Other Ambulatory Visit: Payer: Self-pay | Admitting: Internal Medicine

## 2016-02-11 DIAGNOSIS — M549 Dorsalgia, unspecified: Secondary | ICD-10-CM

## 2016-02-19 ENCOUNTER — Ambulatory Visit
Admission: RE | Admit: 2016-02-19 | Discharge: 2016-02-19 | Disposition: A | Discharge status: Home / Self Care | Payer: Medicare Other | Source: Ambulatory Visit | Attending: Internal Medicine | Admitting: Internal Medicine

## 2016-02-19 ENCOUNTER — Other Ambulatory Visit: Payer: Self-pay | Admitting: Internal Medicine

## 2016-02-19 DIAGNOSIS — M549 Dorsalgia, unspecified: Secondary | ICD-10-CM

## 2016-02-19 DIAGNOSIS — M545 Low back pain: Secondary | ICD-10-CM

## 2016-03-28 ENCOUNTER — Emergency Department (HOSPITAL_COMMUNITY)
Admission: EM | Admit: 2016-03-28 | Discharge: 2016-03-28 | Disposition: A | Discharge status: Home / Self Care | Payer: Medicare Other | Attending: Emergency Medicine | Admitting: Emergency Medicine

## 2016-03-28 ENCOUNTER — Encounter (HOSPITAL_COMMUNITY): Payer: Self-pay

## 2016-03-28 ENCOUNTER — Emergency Department (HOSPITAL_COMMUNITY): Payer: Medicare Other

## 2016-03-28 DIAGNOSIS — I2510 Atherosclerotic heart disease of native coronary artery without angina pectoris: Secondary | ICD-10-CM | POA: Diagnosis not present

## 2016-03-28 DIAGNOSIS — Y939 Activity, unspecified: Secondary | ICD-10-CM | POA: Insufficient documentation

## 2016-03-28 DIAGNOSIS — Y999 Unspecified external cause status: Secondary | ICD-10-CM | POA: Diagnosis not present

## 2016-03-28 DIAGNOSIS — I252 Old myocardial infarction: Secondary | ICD-10-CM | POA: Insufficient documentation

## 2016-03-28 DIAGNOSIS — W5501XA Bitten by cat, initial encounter: Secondary | ICD-10-CM | POA: Insufficient documentation

## 2016-03-28 DIAGNOSIS — I10 Essential (primary) hypertension: Secondary | ICD-10-CM | POA: Insufficient documentation

## 2016-03-28 DIAGNOSIS — E871 Hypo-osmolality and hyponatremia: Secondary | ICD-10-CM | POA: Insufficient documentation

## 2016-03-28 DIAGNOSIS — E878 Other disorders of electrolyte and fluid balance, not elsewhere classified: Secondary | ICD-10-CM | POA: Insufficient documentation

## 2016-03-28 DIAGNOSIS — L03114 Cellulitis of left upper limb: Secondary | ICD-10-CM | POA: Insufficient documentation

## 2016-03-28 DIAGNOSIS — S51852A Open bite of left forearm, initial encounter: Secondary | ICD-10-CM | POA: Insufficient documentation

## 2016-03-28 DIAGNOSIS — Z853 Personal history of malignant neoplasm of breast: Secondary | ICD-10-CM | POA: Diagnosis not present

## 2016-03-28 DIAGNOSIS — Y929 Unspecified place or not applicable: Secondary | ICD-10-CM | POA: Insufficient documentation

## 2016-03-28 DIAGNOSIS — Z7982 Long term (current) use of aspirin: Secondary | ICD-10-CM | POA: Diagnosis not present

## 2016-03-28 LAB — CBC WITH DIFFERENTIAL/PLATELET
Basophils Absolute: 0 10*3/uL (ref 0.0–0.1)
Basophils Relative: 0 %
EOS ABS: 0.1 10*3/uL (ref 0.0–0.7)
EOS PCT: 2 %
HCT: 39.5 % (ref 36.0–46.0)
Hemoglobin: 13.5 g/dL (ref 12.0–15.0)
LYMPHS ABS: 2 10*3/uL (ref 0.7–4.0)
LYMPHS PCT: 30 %
MCH: 31.7 pg (ref 26.0–34.0)
MCHC: 34.2 g/dL (ref 30.0–36.0)
MCV: 92.7 fL (ref 78.0–100.0)
MONO ABS: 0.6 10*3/uL (ref 0.1–1.0)
MONOS PCT: 9 %
Neutro Abs: 3.9 10*3/uL (ref 1.7–7.7)
Neutrophils Relative %: 59 %
PLATELETS: 164 10*3/uL (ref 150–400)
RBC: 4.26 MIL/uL (ref 3.87–5.11)
RDW: 12.8 % (ref 11.5–15.5)
WBC: 6.7 10*3/uL (ref 4.0–10.5)

## 2016-03-28 LAB — COMPREHENSIVE METABOLIC PANEL
ALBUMIN: 3.2 g/dL — AB (ref 3.5–5.0)
ALT: 18 U/L (ref 14–54)
AST: 38 U/L (ref 15–41)
Alkaline Phosphatase: 53 U/L (ref 38–126)
Anion gap: 7 (ref 5–15)
BUN: 12 mg/dL (ref 6–20)
CHLORIDE: 94 mmol/L — AB (ref 101–111)
CO2: 29 mmol/L (ref 22–32)
CREATININE: 0.92 mg/dL (ref 0.44–1.00)
Calcium: 9.6 mg/dL (ref 8.9–10.3)
GFR calc Af Amer: >60 mL/min (ref >60)
GFR, EST NON AFRICAN AMERICAN: 59 mL/min — AB (ref >60)
GLUCOSE: 85 mg/dL (ref 65–99)
POTASSIUM: 3.7 mmol/L (ref 3.5–5.1)
Sodium: 130 mmol/L — ABNORMAL LOW (ref 135–145)
Total Bilirubin: 0.5 mg/dL (ref 0.3–1.2)
Total Protein: 7.3 g/dL (ref 6.5–8.1)

## 2016-03-28 LAB — I-STAT CG4 LACTIC ACID, ED: LACTIC ACID, VENOUS: 1 mmol/L (ref 0.5–1.9)

## 2016-03-28 MED ORDER — SODIUM CHLORIDE 0.9 % IV BOLUS (SEPSIS)
1000.0000 mL | Freq: Once | INTRAVENOUS | Status: AC
  Administered 2016-03-28: 1000 mL via INTRAVENOUS

## 2016-03-28 MED ORDER — AMPICILLIN-SULBACTAM SODIUM 1.5 (1-0.5) G IJ SOLR
1.5000 g | Freq: Once | INTRAMUSCULAR | Status: AC
  Administered 2016-03-28: 1.5 g via INTRAVENOUS
  Filled 2016-03-28: qty 1.5

## 2016-03-28 NOTE — ED Provider Notes (Signed)
[facility] DEPT Provider Note   CSN: [id number] Arrival date & time: 03/28/16  1227     History   Chief Complaint No chief complaint on file.   HPI Alice Medina is a 75 y.o. female who presents with a cat bite. PMH significant for previous cellulitis, Sjogren's, hx of breast cancer of the right breast not currently on chemo/radiation, HTN, CAD, hx of MI. She states that her cat "Jonni Sanger" bit her at about 11PM 3 days ago. She went to her PCP's office and they gave her IM Rocephin and rx for Augmentin. The site of the wound was marked and she was advised to come to the ED if the redness spread beyond the mark.  Labs were drawn at her PCP office and WBC count was noted to be 8.8. Since then she reports associated mild purulent drainage, warmth, and spreading redness. She also reports fever of Tmax 101 2 days ago. She has only been able to take three doses due to N/V and today she started having diarrhea. Her cat's rabies vaccination expired 8/15 but is an indoor cat. He has not been showing any unusual signs of behavior although he does have a history of biting her which has required antibiotics in the past. She has been taking Ibuprofen with mild relief. Denies headache, chest pain, SOB, abdominal pain. HPI  Past Medical History:  Diagnosis Date  . Anxiety   . Arthritis   . Atrial fibrillation ([place])   . Autoimmune disorder ([place])    SJORGREN'S  . Breast cancer ([place])    right breast  . Cardiac arrhythmia due to congenital heart disease   . Chronic headaches   . Depression   . GERD (gastroesophageal reflux disease)   . Hypertension   . Myocardial infarction ([place])    1990  . Osteoporosis   . Status post dilation of esophageal narrowing   . Thyroid disease   . Ulcer     Patient Active Problem List   Diagnosis Date Noted  . Diarrhea 05/28/2013  . Family history of malignant neoplasm of gastrointestinal tract 06/22/2011  . ADENOCARCINOMA, BREAST 05/02/2009  . CORONARY ARTERY  DISEASE 05/02/2009  . DYSPHAGIA UNSPECIFIED 05/02/2009    Past Surgical History:  Procedure Laterality Date  . APPENDECTOMY    . breast reconstuction     x 2  . CHOLECYSTECTOMY    . COLONOSCOPY    . double mastectomy    . PARTIAL HYSTERECTOMY  1990  . TIBIA FRACTURE SURGERY Right    with subsequent hardware removal  . UPPER GASTROINTESTINAL ENDOSCOPY    . WRIST FRACTURE SURGERY Left     OB History    No data available       Home Medications    Prior to Admission medications   Medication Sig Start Date End Date Taking? Authorizing Provider  aspirin 81 MG tablet Take 81 mg by mouth daily.      Historical Provider, MD  cholecalciferol (VITAMIN D) 1000 UNITS tablet Take 1,000 Units by mouth daily.     Historical Provider, MD  cholestyramine light (PREVALITE) 4 G packet Take one packet one to 2 times a day 06/25/13   Inda Castle, MD  cyclobenzaprine (FLEXERIL) 10 MG tablet Take 10 mg by mouth 3 (three) times daily as needed.      Historical Provider, MD  cycloSPORINE (RESTASIS) 0.05 % ophthalmic emulsion 1 drop 2 (two) times daily.      Historical Provider, MD  Diclofenac-Misoprostol 50-0.2 MG TBEC Take  50-200 mg by mouth daily. 03/15/12   Inda Castle, MD  DULoxetine (CYMBALTA) 60 MG capsule Take 60 mg by mouth daily.    Historical Provider, MD  hydroxychloroquine (PLAQUENIL) 200 MG tablet Take 200 mg by mouth daily. 1/2 tab daily    Historical Provider, MD  ibuprofen (ADVIL,MOTRIN) 100 MG chewable tablet Chew 100 mg by mouth as needed.      Historical Provider, MD  levothyroxine (SYNTHROID, LEVOTHROID) 50 MCG tablet Take 50 mcg by mouth daily.      Historical Provider, MD  LORazepam (ATIVAN) 2 MG tablet Take 2 mg by mouth at bedtime.     Historical Provider, MD  Multiple Vitamins-Minerals (MULTIVITAMIN WITH MINERALS) tablet Take 1 tablet by mouth daily.      Historical Provider, MD  raloxifene (EVISTA) 60 MG tablet Take 60 mg by mouth daily. Four times a week     Historical  Provider, MD  sotalol (BETAPACE) 160 MG tablet Take 160 mg by mouth 2 (two) times daily.     Historical Provider, MD  traMADol (ULTRAM) 50 MG tablet Take 50 mg by mouth every 6 (six) hours as needed. Maximum dose= 8 tablets per day    Historical Provider, MD  triamterene-hydrochlorothiazide (DYAZIDE) 37.5-25 MG per capsule Take 1 capsule by mouth every morning.      Historical Provider, MD    Family History Family History  Problem Relation Age of Onset  . Breast cancer Mother   . Lung cancer Mother   . Cancer Father     larynx  . Heart failure Father   . Heart disease Brother   . Colon cancer Brother   . Pseudochol deficiency Other   . Breast cancer Sister   . Breast cancer Maternal Grandmother   . Esophageal cancer Neg Hx   . Rectal cancer Neg Hx     Social History Social History  Substance Use Topics  . Smoking status: Never Smoker  . Smokeless tobacco: Never Used  . Alcohol use Yes     Comment: very rare     Allergies   Robinul [glycopyrrolate]; Sulfamethoxazole-trimethoprim; and Codeine   Review of Systems Review of Systems  Constitutional: Positive for fever.  Respiratory: Negative for shortness of breath.   Cardiovascular: Negative for chest pain.  Gastrointestinal: Positive for diarrhea, nausea and vomiting. Negative for abdominal pain.  Skin: Positive for color change and wound.  Neurological: Negative for headaches.  All other systems reviewed and are negative.    Physical Exam Updated Vital Signs BP 140/72   Pulse (!) 58   Temp 98.4 F (36.9 C) (Oral)   Resp 18   SpO2 100%   Physical Exam  Constitutional: She is oriented to person, place, and time. She appears well-developed and well-nourished. No distress.  Pleasant elderly female in NAD.  HENT:  Head: Normocephalic and atraumatic.  Eyes: Conjunctivae are normal. Pupils are equal, round, and reactive to light. Right eye exhibits no discharge. Left eye exhibits no discharge. No scleral icterus.   Neck: Normal range of motion. Neck supple.  Cardiovascular: Normal rate and regular rhythm.  Exam reveals no gallop and no friction rub.   No murmur heard. Pulmonary/Chest: Effort normal and breath sounds normal. No respiratory distress. She has no wheezes. She has no rales. She exhibits no tenderness.  Abdominal: Soft. She exhibits no distension. There is no tenderness.  Musculoskeletal: She exhibits no edema.  Neurological: She is alert and oriented to person, place, and time.  Skin: Skin is  warm and dry.  Puncture wounds of the left forearm with surrounding redness with lymphatic streaking and mild swelling. Redness has extended beyond previous marked area. FROM of elbow, N/V intact. Not significantly warm. No active drainage from wound.  Psychiatric: She has a normal mood and affect. Her behavior is normal.  Nursing note and vitals reviewed.    ED Treatments / Results  Labs (all labs ordered are listed, but only abnormal results are displayed) Labs Reviewed  COMPREHENSIVE METABOLIC PANEL - Abnormal; Notable for the following:       Result Value   Sodium 130 (*)    Chloride 94 (*)    Albumin 3.2 (*)    GFR calc non Af Amer 59 (*)    All other components within normal limits  CULTURE, BLOOD (ROUTINE X 2)  CULTURE, BLOOD (ROUTINE X 2)  CBC WITH DIFFERENTIAL/PLATELET  I-STAT CG4 LACTIC ACID, ED    EKG  EKG Interpretation  Date/Time:  Sunday March 28 2016 16:06:32 EDT Ventricular Rate:  58 PR Interval:    QRS Duration: 92 QT Interval:  489 QTC Calculation: 481 R Axis:   50 Text Interpretation:  Sinus rhythm Prolonged PR interval Anterior infarct, old Confirmed by [facility] MD, JULIE ([id number]) on 03/28/2016 4:09:05 PM       Radiology No results found.  Procedures Procedures (including critical care time)  Medications Ordered in ED Medications  sodium chloride 0.9 % bolus 1,000 mL (1,000 mLs Intravenous New Bag/Given 03/28/16 1641)  ampicillin-sulbactam (UNASYN) 1.5  g in sodium chloride 0.9 % 50 mL IVPB (1.5 g Intravenous New Bag/Given 03/28/16 1652)    Initial Impression / Assessment and Plan / ED Course  I have reviewed the triage vital signs and the nursing notes.  Pertinent labs & imaging results that were available during my care of the patient were reviewed by me and considered in my medical decision making (see chart for details).  Clinical Course   75 year old female presents with worsening cat bite. Patient is afebrile and does not appear septic. Vitals are stable. CBC unremarkable. CMP remarkable for mild hyponatremia and hypochloremia. IVF given. Albumin is 3.2. Lactic acid is normal. CXR is negative and EKG is sinus bradycardia with prolonged PR. Unasyn started. Pain medication offered however patient declined.  On recheck, patient appears comfortable, NAD. Labs and vital signs are reassuring. Advise finish course of Augmentin and take with food. Take probiotics as well for diarrhea and provided reassurance. Site remarked and follow up with PCP recommended. Shared visit with Dr. Gilford Raid. Patient is NAD, non-toxic, with stable VS. Patient is informed of clinical course, understands medical decision making process, and agrees with plan. Opportunity for questions provided and all questions answered. Return precautions given.   Final Clinical Impressions(s) / ED Diagnoses   Final diagnoses:  Cat bite, initial encounter  Cellulitis of left upper extremity    New Prescriptions New Prescriptions   No medications on file     Recardo Evangelist, PA-C 03/28/16 1834    Isla Pence, MD 03/28/16 2330

## 2016-03-28 NOTE — ED Notes (Signed)
Delay in lab draw,  Pt not in room 

## 2016-03-28 NOTE — ED Triage Notes (Signed)
Patient here with cat bite on Thursday and saw her primary MD on Friday. Now having increased swelling and redness to left arm. Was treated with Rocephin IM on Friday and currently taking augmentin. Arm slightly warm to touch

## 2016-04-02 LAB — CULTURE, BLOOD (ROUTINE X 2)
CULTURE: NO GROWTH
Culture: NO GROWTH

## 2016-05-01 ENCOUNTER — Other Ambulatory Visit: Payer: Self-pay | Admitting: Radiology

## 2016-05-01 DIAGNOSIS — Z79899 Other long term (current) drug therapy: Secondary | ICD-10-CM

## 2016-06-01 ENCOUNTER — Encounter: Payer: Self-pay | Admitting: Gastroenterology

## 2016-07-21 ENCOUNTER — Other Ambulatory Visit: Payer: Self-pay | Admitting: Rheumatology

## 2016-07-22 NOTE — Telephone Encounter (Signed)
Last Visit: 02/27/16 Next Visit: 08/19/15 Labs: 02/28/16 Kidney function slightly decreased PLQ Eye Exam: 01/07/16 WNL  Okay to refill PLQ?

## 2016-08-18 ENCOUNTER — Ambulatory Visit (INDEPENDENT_AMBULATORY_CARE_PROVIDER_SITE_OTHER): Payer: PPO | Admitting: Rheumatology

## 2016-08-18 ENCOUNTER — Encounter: Payer: Self-pay | Admitting: Rheumatology

## 2016-08-18 VITALS — BP 112/68 | HR 78 | Resp 14 | Ht 67.0 in | Wt 147.0 lb

## 2016-08-18 DIAGNOSIS — I7300 Raynaud's syndrome without gangrene: Secondary | ICD-10-CM

## 2016-08-18 DIAGNOSIS — M359 Systemic involvement of connective tissue, unspecified: Secondary | ICD-10-CM | POA: Diagnosis not present

## 2016-08-18 DIAGNOSIS — G8929 Other chronic pain: Secondary | ICD-10-CM | POA: Diagnosis not present

## 2016-08-18 DIAGNOSIS — M5442 Lumbago with sciatica, left side: Secondary | ICD-10-CM

## 2016-08-18 DIAGNOSIS — R5382 Chronic fatigue, unspecified: Secondary | ICD-10-CM | POA: Diagnosis not present

## 2016-08-18 DIAGNOSIS — Z79899 Other long term (current) drug therapy: Secondary | ICD-10-CM

## 2016-08-18 DIAGNOSIS — M3500 Sicca syndrome, unspecified: Secondary | ICD-10-CM

## 2016-08-18 LAB — CBC WITH DIFFERENTIAL/PLATELET
BASOS ABS: 0 {cells}/uL (ref 0–200)
Basophils Relative: 0 %
EOS ABS: 70 {cells}/uL (ref 15–500)
Eosinophils Relative: 2 %
HCT: 38.7 % (ref 35.0–45.0)
Hemoglobin: 12.7 g/dL (ref 11.7–15.5)
LYMPHS PCT: 45 %
Lymphs Abs: 1575 cells/uL (ref 850–3900)
MCH: 30.3 pg (ref 27.0–33.0)
MCHC: 32.8 g/dL (ref 32.0–36.0)
MCV: 92.4 fL (ref 80.0–100.0)
MONOS PCT: 14 %
MPV: 10.5 fL (ref 7.5–12.5)
Monocytes Absolute: 490 cells/uL (ref 200–950)
NEUTROS ABS: 1365 {cells}/uL — AB (ref 1500–7800)
Neutrophils Relative %: 39 %
PLATELETS: 189 10*3/uL (ref 140–400)
RBC: 4.19 MIL/uL (ref 3.80–5.10)
RDW: 13.2 % (ref 11.0–15.0)
WBC: 3.5 10*3/uL — ABNORMAL LOW (ref 3.8–10.8)

## 2016-08-18 LAB — COMPLETE METABOLIC PANEL WITH GFR
ALK PHOS: 51 U/L (ref 33–130)
ALT: 13 U/L (ref 6–29)
AST: 31 U/L (ref 10–35)
Albumin: 3.4 g/dL — ABNORMAL LOW (ref 3.6–5.1)
BILIRUBIN TOTAL: 0.4 mg/dL (ref 0.2–1.2)
BUN: 19 mg/dL (ref 7–25)
CO2: 27 mmol/L (ref 20–31)
Calcium: 10 mg/dL (ref 8.6–10.4)
Chloride: 97 mmol/L — ABNORMAL LOW (ref 98–110)
Creat: 0.87 mg/dL (ref 0.60–0.93)
GFR, EST AFRICAN AMERICAN: 75 mL/min (ref >=60)
GFR, Est Non African American: 65 mL/min (ref >=60)
Glucose, Bld: 81 mg/dL (ref 65–99)
Potassium: 3.7 mmol/L (ref 3.5–5.3)
Sodium: 133 mmol/L — ABNORMAL LOW (ref 135–146)
TOTAL PROTEIN: 7 g/dL (ref 6.1–8.1)

## 2016-08-18 MED ORDER — HYDROXYCHLOROQUINE SULFATE 200 MG PO TABS: 200.0000 mg | ORAL_TABLET | Freq: Every day | ORAL | 1 refills | Status: DC

## 2016-08-18 NOTE — Patient Instructions (Signed)
plq eye exam scheduled for around may 2018 Dob: 1941-07-07

## 2016-08-18 NOTE — Progress Notes (Signed)
Office Visit Note  Patient: Alice Medina             Date of Birth: August 07, 1940           MRN: [id number]             PCP: Precious Reel, MD Referring: Shon Baton, MD Visit Date: 08/18/2016 Occupation: _0 @    Subjective:  Follow-up Follow-up on autoimmune disease, high risk prescription  History of Present Illness: Alice Medina is a 76 y.o. female  Last seen 02/27/2016 Patient doing well with her disease except has off and on oral or nasal ulcers. She uses Magic mouthwash with good relief. She's taking Plaquenil 200 mg daily and does not miss a dose. Had a Plaquenil eye exam June 2017 at Dr. Rise Mu office, my eye doctor, and the exam was fine.  History of low back pain with left radiculopathy. She saw Dr. Nelva Bush  who gave her 2 injections. The first injection was not useful. The second one helped a lot. She is essentially pain-free at this time for her low back pain.  Activities of Daily Living:  Patient reports morning stiffness for 15 minutes.   Patient Denies nocturnal pain.  Difficulty dressing/grooming: Denies Difficulty climbing stairs: Denies Difficulty getting out of chair: Denies Difficulty using hands for taps, buttons, cutlery, and/or writing: Reports     Review of Systems  Constitutional: Negative for fatigue.  HENT: Negative for mouth sores and mouth dryness.   Eyes: Negative for dryness.  Respiratory: Negative for shortness of breath.   Gastrointestinal: Negative for constipation and diarrhea.  Musculoskeletal: Negative for myalgias and myalgias.  Skin: Negative for sensitivity to sunlight.  Psychiatric/Behavioral: Negative for decreased concentration and sleep disturbance.    PMFS History:  Patient Active Problem List   Diagnosis Date Noted  . Diarrhea 05/28/2013  . Family history of malignant neoplasm of gastrointestinal tract 06/22/2011  . ADENOCARCINOMA, BREAST 05/02/2009  . CORONARY ARTERY DISEASE 05/02/2009  . DYSPHAGIA  UNSPECIFIED 05/02/2009    Past Medical History:  Diagnosis Date  . Anxiety   . Arthritis   . Atrial fibrillation ([place])   . Autoimmune disorder ([place])    SJORGREN'S  . Breast cancer ([place])    right breast  . Cardiac arrhythmia due to congenital heart disease   . Chronic headaches   . Depression   . GERD (gastroesophageal reflux disease)   . Hypertension   . Myocardial infarction    1990  . Osteoporosis   . Status post dilation of esophageal narrowing   . Thyroid disease   . Ulcer ([place])     Family History  Problem Relation Age of Onset  . Breast cancer Mother   . Lung cancer Mother   . Cancer Father     larynx  . Heart failure Father   . Heart disease Brother   . Colon cancer Brother   . Pseudochol deficiency Other   . Breast cancer Sister   . Breast cancer Maternal Grandmother   . Esophageal cancer Neg Hx   . Rectal cancer Neg Hx    Past Surgical History:  Procedure Laterality Date  . APPENDECTOMY    . breast reconstuction     x 2  . CHOLECYSTECTOMY    . COLONOSCOPY    . double mastectomy    . PARTIAL HYSTERECTOMY  1990  . TIBIA FRACTURE SURGERY Right    with subsequent hardware removal  . UPPER GASTROINTESTINAL ENDOSCOPY    . WRIST FRACTURE SURGERY Left  Social History   Social History Narrative   1 cup of coffee daily     Objective: Vital Signs: BP 112/68   Pulse 78   Resp 14   Ht _0  (1.702 m)   Wt 147 lb (66.7 kg)   BMI 23.02 kg/m    Physical Exam  Constitutional: She is oriented to person, place, and time. She appears well-developed and well-nourished.  HENT:  Head: Normocephalic and atraumatic.  Eyes: EOM are normal. Pupils are equal, round, and reactive to light.  Cardiovascular: Normal rate, regular rhythm and normal heart sounds.  Exam reveals no gallop and no friction rub.   No murmur heard. Pulmonary/Chest: Effort normal and breath sounds normal. She has no wheezes. She has no rales.  Abdominal: Soft. Bowel sounds are normal. She  exhibits no distension. There is no tenderness. There is no guarding. No hernia.  Musculoskeletal: Normal range of motion. She exhibits no edema, tenderness or deformity.  Lymphadenopathy:    She has no cervical adenopathy.  Neurological: She is alert and oriented to person, place, and time. Coordination normal.  Skin: Skin is warm and dry. Capillary refill takes less than 2 seconds. No rash noted.  Psychiatric: She has a normal mood and affect. Her behavior is normal.  Nursing note and vitals reviewed.    Musculoskeletal Exam:  Full range of motion of all joints Grip strength is equal and strong bilaterally Fiber myalgia tender points are all absent  CDAI Exam: No CDAI exam completed.    Investigation: No additional findings. Admission on 03/28/2016, Discharged on 03/28/2016  Component Date Value Ref Range Status  . Lactic Acid, Venous 03/28/2016 1.00  0.5 - 1.9 mmol/L Final  . Sodium 03/28/2016 130* 135 - 145 mmol/L Final  . Potassium 03/28/2016 3.7  3.5 - 5.1 mmol/L Final  . Chloride 03/28/2016 94* 101 - 111 mmol/L Final  . CO2 03/28/2016 29  22 - 32 mmol/L Final  . Glucose, Bld 03/28/2016 85  65 - 99 mg/dL Final  . BUN 03/28/2016 12  6 - 20 mg/dL Final  . Creatinine, Ser 03/28/2016 0.92  0.44 - 1.00 mg/dL Final  . Calcium 03/28/2016 9.6  8.9 - 10.3 mg/dL Final  . Total Protein 03/28/2016 7.3  6.5 - 8.1 g/dL Final  . Albumin 03/28/2016 3.2* 3.5 - 5.0 g/dL Final  . AST 03/28/2016 38  15 - 41 U/L Final  . ALT 03/28/2016 18  14 - 54 U/L Final  . Alkaline Phosphatase 03/28/2016 53  38 - 126 U/L Final  . Total Bilirubin 03/28/2016 0.5  0.3 - 1.2 mg/dL Final  . GFR calc non Af Amer 03/28/2016 59* >60 mL/min Final  . GFR calc Af Amer 03/28/2016 >60  >60 mL/min Final   Comment: (NOTE) The eGFR has been calculated using the CKD EPI equation. This calculation has not been validated in all clinical situations. eGFR's persistently <60 mL/min signify possible Chronic  Kidney Disease.   . Anion gap 03/28/2016 7  5 - 15 Final  . WBC 03/28/2016 6.7  4.0 - 10.5 K/uL Final  . RBC 03/28/2016 4.26  3.87 - 5.11 MIL/uL Final  . Hemoglobin 03/28/2016 13.5  12.0 - 15.0 g/dL Final  . HCT 03/28/2016 39.5  36.0 - 46.0 % Final  . MCV 03/28/2016 92.7  78.0 - 100.0 fL Final  . MCH 03/28/2016 31.7  26.0 - 34.0 pg Final  . MCHC 03/28/2016 34.2  30.0 - 36.0 g/dL Final  . RDW 03/28/2016 12.8  11.5 - 15.5 % Final  . Platelets 03/28/2016 164  150 - 400 K/uL Final  . Neutrophils Relative % 03/28/2016 59  % Final  . Neutro Abs 03/28/2016 3.9  1.7 - 7.7 K/uL Final  . Lymphocytes Relative 03/28/2016 30  % Final  . Lymphs Abs 03/28/2016 2.0  0.7 - 4.0 K/uL Final  . Monocytes Relative 03/28/2016 9  % Final  . Monocytes Absolute 03/28/2016 0.6  0.1 - 1.0 K/uL Final  . Eosinophils Relative 03/28/2016 2  % Final  . Eosinophils Absolute 03/28/2016 0.1  0.0 - 0.7 K/uL Final  . Basophils Relative 03/28/2016 0  % Final  . Basophils Absolute 03/28/2016 0.0  0.0 - 0.1 K/uL Final  . Specimen Description 03/28/2016 BLOOD LEFT THUMB   Final  . Special Requests 03/28/2016 BOTTLES DRAWN AEROBIC AND ANAEROBIC 3CC   Final  . Culture 03/28/2016 NO GROWTH 5 DAYS   Final  . Report Status 03/28/2016 04/02/2016 FINAL   Final  . Specimen Description 03/28/2016 BLOOD RIGHT FOREARM   Final  . Special Requests 03/28/2016 BOTTLES DRAWN AEROBIC AND ANAEROBIC 5CC   Final  . Culture 03/28/2016 NO GROWTH 5 DAYS   Final  . Report Status 03/28/2016 04/02/2016 FINAL   Final     Imaging: No results found.  Speciality Comments: No specialty comments available.    Procedures:  No procedures performed Allergies: Robinul [glycopyrrolate]; Sulfamethoxazole-trimethoprim; and Codeine   Assessment / Plan:     Visit Diagnoses: No diagnosis found.    Plan: #1: History of autoimmune disease. Doing well. No flare. History of oral and nasal ulcers and Raynaud's and photosensitivity. Has oral and nasal  ulcers off and on. Uses Magic mouthwash. Has enough at this time. I give her 1:1:1 Benadryl/2% viscous lidocaine/Maalox. 15 mL swish to mouth for 30 seconds and then spit every 3-4 hours when necessary dispense 4 ounces with 5 refills. Is a mention above, she does not need any refills at this time.  #2: History of high risk prescription. Taking Plaquenil 200 mg daily. Plaquenil eye exam done June 2017 was normal at Dr. Rise Mu office. She will go back May 2018 for repeat.  #3: History of low back pain with left radiculopathy. Saw Dr. Dossie Der. Had 2 injections in the back. The first injection did not help with the second injection helped tremendously. Patient is basically pain-free. She states that some of the back pain can be coming from deficiency of vitamin B12. Yes it can cause neuropathy. Patient states her daughter has a history of B12 deficiency and she would like to have her labs tested for that.  #4: History of Sjogren's. Using over-the-counter products with some relief.  #5: Refill Plaquenil 90 day supply with a refill. Plaquenil 200 mg daily.  6: Has not had autoimmune workup in a while and is requesting an autoimmune workup. CBC with differential, CMP with GFR, urinalysis, ANA with titer, rheumatoid factor, Ro and law., Sedimentation rate, C3 and C4, vitamin D 25 OH  #7: Patient gets her bone density through her OB/GYN's office aren't this is Dr. Milta Deiters). She states that she will keep up with this through his office.  #8: Return to clinic in 5 months  #9: Neck follow-up appointment with Dr. Estanislado Pandy.  Orders: No orders of the defined types were placed in this encounter.  Meds ordered this encounter  Medications  . hydroxychloroquine (PLAQUENIL) 200 MG tablet    Sig: Take 1 tablet (200 mg total) by mouth daily.  Dispense:  90 tablet    Refill:  1    Order Specific Question:   Supervising Provider    Answer:   Bo Merino [phone]    Face-to-face time  spent with patient was 30 minutes. 50% of time was spent in counseling and coordination of care.  Follow-Up Instructions: Return in about 5 months (around 01/16/2017) for a.d.; plq 200qd; lbp w/  l. radic; sjogrens; .   Eliezer Lofts, PA-C  Note - This record has been created using [employer].  Chart creation errors have been sought, but may not always  have been located. Such creation errors do not reflect on  the standard of medical care.

## 2016-08-19 LAB — URINALYSIS, ROUTINE W REFLEX MICROSCOPIC
Bilirubin Urine: NEGATIVE
Glucose, UA: NEGATIVE
HGB URINE DIPSTICK: NEGATIVE
Ketones, ur: NEGATIVE
LEUKOCYTES UA: NEGATIVE
NITRITE: NEGATIVE
PH: 6.5 (ref 5.0–8.0)
PROTEIN: NEGATIVE
Specific Gravity, Urine: 1.011 (ref 1.001–1.035)

## 2016-08-19 LAB — ANA: ANA: POSITIVE — AB

## 2016-08-19 LAB — ANTI-NUCLEAR AB-TITER (ANA TITER): ANA Titer 1: 1:160 {titer} — ABNORMAL HIGH

## 2016-08-19 LAB — SJOGRENS SYNDROME-A EXTRACTABLE NUCLEAR ANTIBODY: SSA (Ro) (ENA) Antibody, IgG: >8 — ABNORMAL HIGH

## 2016-08-19 LAB — SJOGRENS SYNDROME-B EXTRACTABLE NUCLEAR ANTIBODY: SSB (La) (ENA) Antibody, IgG: <1

## 2016-08-19 LAB — SEDIMENTATION RATE: SED RATE: 17 mm/h (ref 0–30)

## 2016-08-19 LAB — RHEUMATOID FACTOR: Rhuematoid fact SerPl-aCnc: 18 IU/mL — ABNORMAL HIGH (ref <14)

## 2016-08-19 LAB — VITAMIN B12: VITAMIN B 12: 535 pg/mL (ref 200–1100)

## 2016-08-20 LAB — PROTEIN ELECTROPHORESIS, SERUM
ALBUMIN ELP: 3.4 g/dL — AB (ref 3.8–4.8)
ALPHA-1-GLOBULIN: 0.3 g/dL (ref 0.2–0.3)
Alpha-2-Globulin: 0.6 g/dL (ref 0.5–0.9)
Beta 2: 0.5 g/dL (ref 0.2–0.5)
Beta Globulin: 0.4 g/dL (ref 0.4–0.6)
Gamma Globulin: 1.7 g/dL (ref 0.8–1.7)
TOTAL PROTEIN, SERUM ELECTROPHOR: 7 g/dL (ref 6.1–8.1)

## 2016-08-20 LAB — C3 AND C4
C3 Complement: 128 mg/dL (ref 90–180)
C4 Complement: 21 mg/dL (ref 16–47)

## 2016-08-22 LAB — VITAMIN B6: Vitamin B6: 40.7 ng/mL — ABNORMAL HIGH (ref 2.1–21.7)

## 2016-11-13 DIAGNOSIS — M25561 Pain in right knee: Secondary | ICD-10-CM | POA: Diagnosis not present

## 2016-11-13 DIAGNOSIS — M25531 Pain in right wrist: Secondary | ICD-10-CM | POA: Diagnosis not present

## 2016-12-24 DIAGNOSIS — E038 Other specified hypothyroidism: Secondary | ICD-10-CM | POA: Diagnosis not present

## 2016-12-24 DIAGNOSIS — E784 Other hyperlipidemia: Secondary | ICD-10-CM | POA: Diagnosis not present

## 2016-12-24 DIAGNOSIS — I10 Essential (primary) hypertension: Secondary | ICD-10-CM | POA: Diagnosis not present

## 2016-12-24 DIAGNOSIS — M810 Age-related osteoporosis without current pathological fracture: Secondary | ICD-10-CM | POA: Diagnosis not present

## 2016-12-31 DIAGNOSIS — Z6823 Body mass index (BMI) 23.0-23.9, adult: Secondary | ICD-10-CM | POA: Diagnosis not present

## 2016-12-31 DIAGNOSIS — R945 Abnormal results of liver function studies: Secondary | ICD-10-CM | POA: Diagnosis not present

## 2016-12-31 DIAGNOSIS — I7300 Raynaud's syndrome without gangrene: Secondary | ICD-10-CM | POA: Diagnosis not present

## 2016-12-31 DIAGNOSIS — Z1389 Encounter for screening for other disorder: Secondary | ICD-10-CM | POA: Diagnosis not present

## 2016-12-31 DIAGNOSIS — I517 Cardiomegaly: Secondary | ICD-10-CM | POA: Diagnosis not present

## 2016-12-31 DIAGNOSIS — Z0000 Encounter for general adult medical examination without abnormal findings: Secondary | ICD-10-CM | POA: Diagnosis not present

## 2016-12-31 DIAGNOSIS — D899 Disorder involving the immune mechanism, unspecified: Secondary | ICD-10-CM | POA: Diagnosis not present

## 2016-12-31 DIAGNOSIS — I1310 Hypertensive heart and chronic kidney disease without heart failure, with stage 1 through stage 4 chronic kidney disease, or unspecified chronic kidney disease: Secondary | ICD-10-CM | POA: Diagnosis not present

## 2016-12-31 DIAGNOSIS — F325 Major depressive disorder, single episode, in full remission: Secondary | ICD-10-CM | POA: Diagnosis not present

## 2016-12-31 DIAGNOSIS — F1320 Sedative, hypnotic or anxiolytic dependence, uncomplicated: Secondary | ICD-10-CM | POA: Diagnosis not present

## 2016-12-31 DIAGNOSIS — M3509 Sicca syndrome with other organ involvement: Secondary | ICD-10-CM | POA: Diagnosis not present

## 2016-12-31 DIAGNOSIS — N183 Chronic kidney disease, stage 3 (moderate): Secondary | ICD-10-CM | POA: Diagnosis not present

## 2017-01-13 DIAGNOSIS — L568 Other specified acute skin changes due to ultraviolet radiation: Secondary | ICD-10-CM | POA: Insufficient documentation

## 2017-01-13 DIAGNOSIS — K1379 Other lesions of oral mucosa: Secondary | ICD-10-CM

## 2017-01-13 DIAGNOSIS — Z8614 Personal history of Methicillin resistant Staphylococcus aureus infection: Secondary | ICD-10-CM | POA: Insufficient documentation

## 2017-01-13 DIAGNOSIS — M19042 Primary osteoarthritis, left hand: Secondary | ICD-10-CM

## 2017-01-13 DIAGNOSIS — I7300 Raynaud's syndrome without gangrene: Secondary | ICD-10-CM | POA: Insufficient documentation

## 2017-01-13 DIAGNOSIS — M3501 Sicca syndrome with keratoconjunctivitis: Secondary | ICD-10-CM

## 2017-01-13 DIAGNOSIS — M19041 Primary osteoarthritis, right hand: Secondary | ICD-10-CM | POA: Insufficient documentation

## 2017-01-13 DIAGNOSIS — M810 Age-related osteoporosis without current pathological fracture: Secondary | ICD-10-CM | POA: Insufficient documentation

## 2017-01-13 DIAGNOSIS — M359 Systemic involvement of connective tissue, unspecified: Secondary | ICD-10-CM | POA: Insufficient documentation

## 2017-01-13 DIAGNOSIS — Z79899 Other long term (current) drug therapy: Secondary | ICD-10-CM | POA: Insufficient documentation

## 2017-01-13 DIAGNOSIS — M170 Bilateral primary osteoarthritis of knee: Secondary | ICD-10-CM | POA: Insufficient documentation

## 2017-01-13 DIAGNOSIS — Z8719 Personal history of other diseases of the digestive system: Secondary | ICD-10-CM | POA: Insufficient documentation

## 2017-01-13 DIAGNOSIS — M8589 Other specified disorders of bone density and structure, multiple sites: Secondary | ICD-10-CM | POA: Insufficient documentation

## 2017-01-13 DIAGNOSIS — Z8639 Personal history of other endocrine, nutritional and metabolic disease: Secondary | ICD-10-CM

## 2017-01-13 DIAGNOSIS — M19071 Primary osteoarthritis, right ankle and foot: Secondary | ICD-10-CM | POA: Insufficient documentation

## 2017-01-13 DIAGNOSIS — Z8679 Personal history of other diseases of the circulatory system: Secondary | ICD-10-CM | POA: Insufficient documentation

## 2017-01-13 DIAGNOSIS — M19072 Primary osteoarthritis, left ankle and foot: Secondary | ICD-10-CM

## 2017-01-13 HISTORY — DX: Other lesions of oral mucosa: K13.79

## 2017-01-13 HISTORY — DX: Raynaud's syndrome without gangrene: I73.00

## 2017-01-13 HISTORY — DX: Other specified disorders of bone density and structure, multiple sites: M85.89

## 2017-01-13 HISTORY — DX: Personal history of other endocrine, nutritional and metabolic disease: Z86.39

## 2017-01-13 HISTORY — DX: Sjogren syndrome with keratoconjunctivitis: M35.01

## 2017-01-13 HISTORY — DX: Primary osteoarthritis, right hand: M19.041

## 2017-01-13 HISTORY — DX: Other specified acute skin changes due to ultraviolet radiation: L56.8

## 2017-01-13 HISTORY — DX: Personal history of Methicillin resistant Staphylococcus aureus infection: Z86.14

## 2017-01-13 HISTORY — DX: Primary osteoarthritis, left hand: M19.042

## 2017-01-13 NOTE — Progress Notes (Signed)
Office Visit Note  Patient: Alice Medina             Date of Birth: November 14, 1940           MRN: [id number]             PCP: Shon Baton, MD Referring: Shon Baton, MD Visit Date: 01/17/2017 Occupation: @GUAROCC @    Subjective:  Rash   History of Present Illness: [name] is a 76 y.o. female with autoimmune disease. She is is having pain in her hands. She states she's been working in the house and was cleaning under the refrigerator after her right SI joint has become more painful. Her knee joints are doing well. She continues to have some discomfort in her feet especially her right foot. She's also noticed a rash on her extremities and her chest since some March. She believes this to do some exposure. She has not been using sunscreen.  Activities of Daily Living:  Patient reports morning stiffness for 30 minutes.   Patient Denies nocturnal pain.  Difficulty dressing/grooming: Denies Difficulty climbing stairs: Reports Difficulty getting out of chair: Reports Difficulty using hands for taps, buttons, cutlery, and/or writing: Denies   Review of Systems  Constitutional: Positive for fatigue. Negative for night sweats, weight gain, weight loss and weakness.  HENT: Positive for mouth sores and mouth dryness. Negative for trouble swallowing, trouble swallowing and nose dryness.   Eyes: Positive for dryness. Negative for pain, redness and visual disturbance.  Respiratory: Negative for cough, shortness of breath and difficulty breathing.   Cardiovascular: Negative for chest pain, palpitations, hypertension, irregular heartbeat and swelling in legs/feet.  Gastrointestinal: Negative for blood in stool, constipation and diarrhea.  Endocrine: Negative for increased urination.  Genitourinary: Negative for vaginal dryness.  Musculoskeletal: Positive for arthralgias, joint pain and morning stiffness. Negative for joint swelling, myalgias, muscle weakness, muscle tenderness and  myalgias.  Skin: Positive for rash. Negative for color change, hair loss, skin tightness, ulcers and sensitivity to sunlight.  Allergic/Immunologic: Negative for susceptible to infections.  Neurological: Negative for dizziness, memory loss and night sweats.  Hematological: Negative for swollen glands.  Psychiatric/Behavioral: Positive for depressed mood and sleep disturbance. The patient is nervous/anxious.        Insomnia is better with medication.    PMFS History:  Patient Active Problem List   Diagnosis Date Noted  . Autoimmune disease ([place]) +ANA +Ro +La +RF oral ulcers nasal ulcers Raynaud photosensitivity SICCA  01/13/2017  . High risk medication use 01/13/2017  . Photosensitivity 01/13/2017  . Sjogren's syndrome with keratoconjunctivitis sicca ([place]) 01/13/2017  . Recurrent oral ulcers and nasal ulcers  01/13/2017  . Raynaud's disease without gangrene 01/13/2017  . Primary osteoarthritis of both hands 01/13/2017  . Primary osteoarthritis of both knees 01/13/2017  . Primary osteoarthritis of both feet 01/13/2017  . Osteopenia of multiple sites 01/13/2017  . History of hypertension 01/13/2017  . History of hypothyroidism 01/13/2017  . History of gastroesophageal reflux (GERD)/ PUD 01/13/2017  . History of MRSA infection 01/13/2017  . Diarrhea 05/28/2013  . Family history of malignant neoplasm of gastrointestinal tract 06/22/2011  . ADENOCARCINOMA, BREAST 05/02/2009  . CORONARY ARTERY DISEASE 05/02/2009  . DYSPHAGIA UNSPECIFIED 05/02/2009    Past Medical History:  Diagnosis Date  . Anxiety   . Arthritis   . Atrial fibrillation ([place])   . Autoimmune disorder ([place])    SJORGREN'S  . Breast cancer ([place])    right breast  . Cardiac arrhythmia due to congenital heart  disease   . Chronic headaches   . Depression   . GERD (gastroesophageal reflux disease)   . Hypertension   . Myocardial infarction ([place])    1990  . Osteoporosis   . Status post dilation of esophageal narrowing    . Thyroid disease   . Ulcer     Family History  Problem Relation Age of Onset  . Breast cancer Mother   . Lung cancer Mother   . Cancer Father        larynx  . Heart failure Father   . Heart disease Brother   . Colon cancer Brother   . Pseudochol deficiency Other   . Breast cancer Sister   . Breast cancer Maternal Grandmother   . Esophageal cancer Neg Hx   . Rectal cancer Neg Hx    Past Surgical History:  Procedure Laterality Date  . APPENDECTOMY    . breast reconstuction     x 2  . CHOLECYSTECTOMY    . COLONOSCOPY    . double mastectomy    . PARTIAL HYSTERECTOMY  1990  . TIBIA FRACTURE SURGERY Right    with subsequent hardware removal  . UPPER GASTROINTESTINAL ENDOSCOPY    . WRIST FRACTURE SURGERY Left    Social History   Social History Narrative   1 cup of coffee daily     Objective: Vital Signs: BP 124/72   Pulse 78   Resp 14   Ht 5\' 7"  (1.702 m)   Wt 147 lb (66.7 kg)   BMI 23.02 kg/m    Physical Exam  Constitutional: She is oriented to person, place, and time. She appears well-developed and well-nourished.  HENT:  Head: Normocephalic and atraumatic.  Eyes: Conjunctivae and EOM are normal.  Neck: Normal range of motion.  Cardiovascular: Normal rate, regular rhythm, normal heart sounds and intact distal pulses.   Pulmonary/Chest: Effort normal and breath sounds normal.  Abdominal: Soft. Bowel sounds are normal.  Lymphadenopathy:    She has no cervical adenopathy.  Neurological: She is alert and oriented to person, place, and time.  Skin: Skin is warm and dry. Capillary refill takes less than 2 seconds.  Pertinent is maculopapular rash on her bilateral upper extremities and her neck and upper back area with excoriation marks.  Psychiatric: She has a normal mood and affect. Her behavior is normal.  Nursing note and vitals reviewed.    Musculoskeletal Exam: C-spine and thoracic lumbar spine good range of motion. She has some tenderness over right SI  joint area. Shoulder joints elbow joints wrist joints are good range of motion. She had DIP PIP thickening consistent with osteoarthritis. Hip joints knee joint, ankle joints with good range of motion. She has some osteoarthritic changes in her feet with overcrowding of her toes. She also had postsurgical scar in her right ankle joint.  CDAI Exam: No CDAI exam completed.    Investigation: Findings:  01/07/2016 normal PLQ eye exam  12/24/2016 LDL 90, CMP normal, CBC WBC 3.69, TSH normal, vitamin D 46.6 normal, UA negative  CBC Latest Ref Rng & Units 08/18/2016 03/28/2016  WBC 3.8 - 10.8 K/uL 3.5(L) 6.7  Hemoglobin 11.7 - 15.5 g/dL 12.7 13.5  Hematocrit 35.0 - 45.0 % 38.7 39.5  Platelets 140 - 400 K/uL 189 164   CMP Latest Ref Rng & Units 08/18/2016 03/28/2016  Glucose 65 - 99 mg/dL 81 85  BUN 7 - 25 mg/dL 19 12  Creatinine 0.60 - 0.93 mg/dL 0.87 0.92  Sodium 135 -  146 mmol/L 133(L) 130(L)  Potassium 3.5 - 5.3 mmol/L 3.7 3.7  Chloride 98 - 110 mmol/L 97(L) 94(L)  CO2 20 - 31 mmol/L 27 29  Calcium 8.6 - 10.4 mg/dL 10.0 9.6  Total Protein 6.1 - 8.1 g/dL 7.0 7.3  Total Bilirubin 0.2 - 1.2 mg/dL 0.4 0.5  Alkaline Phos 33 - 130 U/L 51 53  AST 10 - 35 U/L 31 38  ALT 6 - 29 U/L 13 18    Imaging: No results found.  Speciality Comments: No specialty comments available.    Procedures:  Large Joint Inj Date/Time: 01/17/2017 12:11 PM Performed by: Bo Merino Authorized by: Bo Merino   Consent Given by:  Patient Site marked: the procedure site was marked   Timeout: prior to procedure the correct patient, procedure, and site was verified   Indications:  Pain Location:  Sacroiliac Site:  R sacroiliac joint Prep: patient was prepped and draped in usual sterile fashion   Needle Size:  27 G Needle Length:  1.5 inches Approach:  Posterior Ultrasound Guidance: No   Fluoroscopic Guidance: No   Arthrogram: No   Medications:  40 mg triamcinolone acetonide 40 MG/ML; 1 mL  lidocaine 2 % Aspiration Attempted: Yes   Aspirate amount (mL):  0 Patient tolerance:  Patient tolerated the procedure well with no immediate complications   Allergies: Robinul [glycopyrrolate]; Sulfamethoxazole-trimethoprim; and Codeine   Assessment / Plan:     Visit Diagnoses: Autoimmune disease ([place]) +ANA +Ro +La +RF oral ulcers nasal ulcers Raynaud photosensitivity SICCA : She continues to have some sicca symptoms. Overall her symptoms are fairly well controlled. I do not see any active synovitis on examination today.  High risk medication use - PLQ 200 mg po qd. Her labs done at her PCPs office in June were within normal limits. We will check her next labs and 5 months.  Rash: She had a maculopapular rash on her upper extremities and neck and back area. I've advised her to see a dermatologist as soon as possible.  Right sacroiliac joint pain: The pain started after doing certain activities at home. Different treatment options and their side effects were discussed. We'll proceed with cortisone injection. The procedure is described above.  Raynaud's disease without gangrene: Not active currently  Primary osteoarthritis of both hands: She does have DIP PIP changes consistent with osteoarthritis joint protection and muscle strengthening was discussed.  Primary osteoarthritis of both knees: Chronic pain  Primary osteoarthritis of both feet: She has chronic discomfort due to osteoarthritis. She has overcrowding of her toes. She also has postsurgical changes in her right ankle.  Her other medical problems are as follows  . Osteopenia of multiple sites - on Evista  History of hypertension  History of hypothyroidism  History of gastroesophageal reflux (GERD)/ PUD  History of MRSA infection    Orders: Orders Placed This Encounter  Procedures  . Large Joint Injection/Arthrocentesis   No orders of the defined types were placed in this encounter.   Face-to-face time spent with  patient was 30 minutes. 50% of time was spent in counseling and coordination of care.  Follow-Up Instructions: Return in about 5 months (around 06/19/2017) for Rheumatoid arthritis.   Bo Merino, MD  Note - This record has been created using [employer].  Chart creation errors have been sought, but may not always  have been located. Such creation errors do not reflect on  the standard of medical care.

## 2017-01-14 DIAGNOSIS — I10 Essential (primary) hypertension: Secondary | ICD-10-CM | POA: Diagnosis not present

## 2017-01-14 DIAGNOSIS — E785 Hyperlipidemia, unspecified: Secondary | ICD-10-CM | POA: Diagnosis not present

## 2017-01-14 DIAGNOSIS — I480 Paroxysmal atrial fibrillation: Secondary | ICD-10-CM | POA: Diagnosis not present

## 2017-01-14 DIAGNOSIS — Z79899 Other long term (current) drug therapy: Secondary | ICD-10-CM | POA: Diagnosis not present

## 2017-01-14 DIAGNOSIS — E039 Hypothyroidism, unspecified: Secondary | ICD-10-CM | POA: Diagnosis not present

## 2017-01-14 DIAGNOSIS — I2510 Atherosclerotic heart disease of native coronary artery without angina pectoris: Secondary | ICD-10-CM | POA: Diagnosis not present

## 2017-01-17 ENCOUNTER — Other Ambulatory Visit: Payer: Self-pay | Admitting: *Deleted

## 2017-01-17 ENCOUNTER — Ambulatory Visit (INDEPENDENT_AMBULATORY_CARE_PROVIDER_SITE_OTHER): Payer: PPO | Admitting: Rheumatology

## 2017-01-17 ENCOUNTER — Encounter: Payer: Self-pay | Admitting: Rheumatology

## 2017-01-17 VITALS — BP 124/72 | HR 78 | Resp 14 | Ht 67.0 in | Wt 147.0 lb

## 2017-01-17 DIAGNOSIS — M359 Systemic involvement of connective tissue, unspecified: Secondary | ICD-10-CM

## 2017-01-17 DIAGNOSIS — M19071 Primary osteoarthritis, right ankle and foot: Secondary | ICD-10-CM | POA: Diagnosis not present

## 2017-01-17 DIAGNOSIS — Z8639 Personal history of other endocrine, nutritional and metabolic disease: Secondary | ICD-10-CM | POA: Diagnosis not present

## 2017-01-17 DIAGNOSIS — Z79899 Other long term (current) drug therapy: Secondary | ICD-10-CM

## 2017-01-17 DIAGNOSIS — M8589 Other specified disorders of bone density and structure, multiple sites: Secondary | ICD-10-CM

## 2017-01-17 DIAGNOSIS — Z8719 Personal history of other diseases of the digestive system: Secondary | ICD-10-CM | POA: Diagnosis not present

## 2017-01-17 DIAGNOSIS — I7300 Raynaud's syndrome without gangrene: Secondary | ICD-10-CM

## 2017-01-17 DIAGNOSIS — Z8614 Personal history of Methicillin resistant Staphylococcus aureus infection: Secondary | ICD-10-CM

## 2017-01-17 DIAGNOSIS — M19041 Primary osteoarthritis, right hand: Secondary | ICD-10-CM | POA: Diagnosis not present

## 2017-01-17 DIAGNOSIS — M170 Bilateral primary osteoarthritis of knee: Secondary | ICD-10-CM | POA: Diagnosis not present

## 2017-01-17 DIAGNOSIS — Z8679 Personal history of other diseases of the circulatory system: Secondary | ICD-10-CM | POA: Diagnosis not present

## 2017-01-17 DIAGNOSIS — M19072 Primary osteoarthritis, left ankle and foot: Secondary | ICD-10-CM

## 2017-01-17 DIAGNOSIS — R21 Rash and other nonspecific skin eruption: Secondary | ICD-10-CM

## 2017-01-17 DIAGNOSIS — D8989 Other specified disorders involving the immune mechanism, not elsewhere classified: Secondary | ICD-10-CM

## 2017-01-17 DIAGNOSIS — M533 Sacrococcygeal disorders, not elsewhere classified: Secondary | ICD-10-CM | POA: Diagnosis not present

## 2017-01-17 DIAGNOSIS — M19042 Primary osteoarthritis, left hand: Secondary | ICD-10-CM

## 2017-01-17 MED ORDER — TRIAMCINOLONE ACETONIDE 40 MG/ML IJ SUSP
40.0000 mg | INTRAMUSCULAR | Status: AC | PRN
  Administered 2017-01-17: 40 mg via INTRA_ARTICULAR

## 2017-01-17 MED ORDER — LIDOCAINE HCL 2 % IJ SOLN
1.0000 mL | INTRAMUSCULAR | Status: AC | PRN
  Administered 2017-01-17: 1 mL

## 2017-01-17 NOTE — Patient Instructions (Addendum)
Standing Labs We placed an order today for your standing lab work.    Please come back and get your standing labs in November and every 5 months  We have open lab Monday through Friday from 8:30-11:30 AM and 1:30-4 PM at the office of Dr. Tresa Moore, PA.   The office is located at [address], [place], [place], [place] [address] No appointment is necessary.   Labs are drawn by [facility].  You may receive a bill from [facility] for your lab work. If you have any questions regarding directions or hours of operation,  please call [phone].

## 2017-01-17 NOTE — Patient Outreach (Signed)
[facility] Screening call, unsuccessful but left a message for a return call. If I do not hear back from the member I will try again within the week.  Jenasis Straley C. Keri Veale, MSN, GNP-BC Gerontological Nurse Practitioner [facility] Care Management [phone]  

## 2017-01-18 ENCOUNTER — Other Ambulatory Visit: Payer: Self-pay | Admitting: *Deleted

## 2017-01-18 NOTE — Patient Outreach (Signed)
[facility]  2nd Screening call, attempt both numbers listed but unsuccessful but left a message for a return call. If I do not hear back from the member I will try again within the week.  Alice Medina. Alice Neither, MSN, [facility] Gerontological Nurse Practitioner [facility] Care Management [phone]

## 2017-01-19 ENCOUNTER — Encounter: Payer: Self-pay | Admitting: *Deleted

## 2017-01-19 NOTE — Addendum Note (Signed)
Addended by: Deloria Lair on: 01/19/2017 04:16 PM   Modules accepted: Orders

## 2017-01-19 NOTE — Patient Outreach (Signed)
[facility] screen. Pt called me back and we discussed her health care concerns. He does have multiple co-morbidites: Hx breast cancer 25 years ago, Lupus, OA, HTN, hypothyroidism, GERD. She lives alone but has many family members that live close by. She is independent and does drive. She was recently not able to afford Voltaren gel for her back and this has been very helpful in the past. She said it cost $100 so she did not get it. I advised it is possible that our pharmacy team may be able to find a way for her to get this and she said she would be so appreciative. She has no other care management needs at this time. I am sending her an introduction letter.  Eulah Pont. Myrtie Neither, MSN, [facility] Gerontological Nurse Practitioner [facility] Care Management [phone]

## 2017-01-21 ENCOUNTER — Other Ambulatory Visit: Payer: Self-pay | Admitting: Pharmacist

## 2017-01-21 NOTE — Patient Outreach (Signed)
Alice Medina is referred to pharmacy for medication assistance related to the cost of Voltaren gel. Per referral, patient reports that she is unable to afford the copayment of this medication, as it was over $100. Note that the generic, diclofenac 1% gel, is covered as a tier 2 option through the patient's insurance. Left a HIPAA compliant message on the patient's voicemail. If have not heard from patient by next week, will give her another call at that time.  Harlow Asa, PharmD, [facility] Management [phone]

## 2017-01-21 NOTE — Patient Outreach (Signed)
Receive a call back from Ms. Lardizabal. Spoke with patient. HIPAA identifiers verified and verbal consent received.  Ms. Ocampo reports that she went to [facility] last week to pick up her Voltaren gel and the copayment was $102. Reports that she did not pick this up. Let her know that per the [employer] website, the generic, diclofenac 1% gel, is covered as a tier 2 option through her [employer] plan.  Call patient's [facility] and speak with a technician who states that the patient's prescription had been sent in for Voltaren 3% gel, rather than the Voltaren 1% gel. Request that the pharmacy fax the patient's provider to request that the prescription be changed to diclofenac 1% gel as this is covered by the patient's plan.  Let Ms. Jarvie know that the prescription was called in for the diclofenac 3%, rather than the diclofenac 1%. Ms. Piercey finds her current tube of Voltaren, and confirms that she has been using the 1%. Let Ms. Goens know that I have asked her pharmacy to fax her doctor to make this change.   Patient expresses appreciation for my assistance and states that she will call with any future medication questions/concerns. Confirm that patient has my phone number.  Will close pharmacy episode at this time.  Harlow Asa, PharmD, [facility] [phone]

## 2017-02-14 ENCOUNTER — Other Ambulatory Visit: Payer: Self-pay | Admitting: Rheumatology

## 2017-02-15 NOTE — Telephone Encounter (Signed)
Last Visit: 01/17/17 Next Visit: 06/22/17 Labs:12/24/2016 CMP normal, CBC WBC 3.69 01/07/2016 normal PLQ eye exam   Okay to refill PLQ?

## 2017-02-15 NOTE — Telephone Encounter (Signed)
ok 

## 2017-03-03 DIAGNOSIS — H04123 Dry eye syndrome of bilateral lacrimal glands: Secondary | ICD-10-CM | POA: Diagnosis not present

## 2017-03-03 DIAGNOSIS — M3210 Systemic lupus erythematosus, organ or system involvement unspecified: Secondary | ICD-10-CM | POA: Diagnosis not present

## 2017-03-03 DIAGNOSIS — Z09 Encounter for follow-up examination after completed treatment for conditions other than malignant neoplasm: Secondary | ICD-10-CM | POA: Diagnosis not present

## 2017-03-03 DIAGNOSIS — H353131 Nonexudative age-related macular degeneration, bilateral, early dry stage: Secondary | ICD-10-CM | POA: Diagnosis not present

## 2017-03-03 DIAGNOSIS — Z049 Encounter for examination and observation for unspecified reason: Secondary | ICD-10-CM | POA: Diagnosis not present

## 2017-03-03 DIAGNOSIS — H2513 Age-related nuclear cataract, bilateral: Secondary | ICD-10-CM | POA: Diagnosis not present

## 2017-03-03 DIAGNOSIS — M3501 Sicca syndrome with keratoconjunctivitis: Secondary | ICD-10-CM | POA: Diagnosis not present

## 2017-03-03 DIAGNOSIS — Z79899 Other long term (current) drug therapy: Secondary | ICD-10-CM | POA: Diagnosis not present

## 2017-03-15 DIAGNOSIS — Z6823 Body mass index (BMI) 23.0-23.9, adult: Secondary | ICD-10-CM | POA: Diagnosis not present

## 2017-03-30 DIAGNOSIS — H43813 Vitreous degeneration, bilateral: Secondary | ICD-10-CM | POA: Diagnosis not present

## 2017-03-30 DIAGNOSIS — H35433 Paving stone degeneration of retina, bilateral: Secondary | ICD-10-CM | POA: Diagnosis not present

## 2017-03-30 DIAGNOSIS — H35373 Puckering of macula, bilateral: Secondary | ICD-10-CM | POA: Diagnosis not present

## 2017-03-30 DIAGNOSIS — H353131 Nonexudative age-related macular degeneration, bilateral, early dry stage: Secondary | ICD-10-CM | POA: Diagnosis not present

## 2017-04-18 DIAGNOSIS — I2510 Atherosclerotic heart disease of native coronary artery without angina pectoris: Secondary | ICD-10-CM | POA: Diagnosis not present

## 2017-04-18 DIAGNOSIS — E785 Hyperlipidemia, unspecified: Secondary | ICD-10-CM | POA: Diagnosis not present

## 2017-04-18 DIAGNOSIS — Z79899 Other long term (current) drug therapy: Secondary | ICD-10-CM | POA: Diagnosis not present

## 2017-04-18 DIAGNOSIS — I119 Hypertensive heart disease without heart failure: Secondary | ICD-10-CM | POA: Diagnosis not present

## 2017-04-18 DIAGNOSIS — I480 Paroxysmal atrial fibrillation: Secondary | ICD-10-CM | POA: Diagnosis not present

## 2017-04-18 DIAGNOSIS — E039 Hypothyroidism, unspecified: Secondary | ICD-10-CM | POA: Diagnosis not present

## 2017-04-23 DIAGNOSIS — Z23 Encounter for immunization: Secondary | ICD-10-CM | POA: Diagnosis not present

## 2017-05-14 ENCOUNTER — Other Ambulatory Visit: Payer: Self-pay | Admitting: Rheumatology

## 2017-05-16 NOTE — Telephone Encounter (Signed)
Last visit: 01/17/17 Next visit: 06/22/17 Labs: 12/24/16 stable  Eye exam: 03/30/17 normal  Ok to refill per Dr. Estanislado Pandy.

## 2017-05-24 DIAGNOSIS — I10 Essential (primary) hypertension: Secondary | ICD-10-CM | POA: Diagnosis not present

## 2017-05-24 DIAGNOSIS — Z6823 Body mass index (BMI) 23.0-23.9, adult: Secondary | ICD-10-CM | POA: Diagnosis not present

## 2017-06-09 DIAGNOSIS — J069 Acute upper respiratory infection, unspecified: Secondary | ICD-10-CM | POA: Diagnosis not present

## 2017-06-09 DIAGNOSIS — Z6824 Body mass index (BMI) 24.0-24.9, adult: Secondary | ICD-10-CM | POA: Diagnosis not present

## 2017-06-09 DIAGNOSIS — R05 Cough: Secondary | ICD-10-CM | POA: Diagnosis not present

## 2017-06-12 NOTE — Progress Notes (Addendum)
Office Visit Note  Patient: Alice Medina             Date of Birth: [birth year]           MRN: [id number]             PCP: Shon Baton, MD Referring: Shon Baton, MD Visit Date: 06/22/2017 Occupation: @GUAROCC @    Subjective:  Sicca symtoms   History of Present Illness: Taylour Lietzke is a 76 y.o. female with history of autoimmune disease, osteoarthritis and osteopenia. She states that she still continues to have some nasal ulcers. Her Raynaud's has been intermittently active. She continues to have sicca symptoms for which she's been using over-the-counter products and drinking fluids. She has some stiffness in her hands and feet but not much discomfort. She denies any pain in her knee joints currently. She states she fell yesterday while she was walking on her carpet she did not lose any consciousness she injured her face and also bruised her right hand. She went to see her PCP and had evaluation. She states she had upper respiratory tract infection. For which she was seen by PCP. She was given antibiotics and prednisone. She also had a lesion on her left arm from the previous cat bite. She states the rash has come back. She was given doxycycline and Bactroban ointment.  Activities of Daily Living:  Patient reports morning stiffness for 15 minutes.   Patient Reports nocturnal pain.  Difficulty dressing/grooming: Denies Difficulty climbing stairs: Reports Difficulty getting out of chair: Reports Difficulty using hands for taps, buttons, cutlery, and/or writing: Denies   Review of Systems  Constitutional: Positive for fatigue. Negative for night sweats, weight gain, weight loss and weakness.  HENT: Positive for mouth dryness. Negative for mouth sores, trouble swallowing, trouble swallowing and nose dryness.   Eyes: Positive for dryness. Negative for pain, redness and visual disturbance.  Respiratory: Negative for cough, shortness of breath and difficulty breathing.     Cardiovascular: Positive for hypertension. Negative for chest pain, palpitations, irregular heartbeat and swelling in legs/feet.  Gastrointestinal: Negative for blood in stool, constipation and diarrhea.  Endocrine: Negative for increased urination.  Genitourinary: Negative for vaginal dryness.  Musculoskeletal: Positive for arthralgias, joint pain and morning stiffness. Negative for joint swelling, myalgias, muscle weakness, muscle tenderness and myalgias.  Skin: Positive for color change and sensitivity to sunlight. Negative for rash, hair loss, skin tightness and ulcers.  Allergic/Immunologic: Negative for susceptible to infections.  Neurological: Negative for dizziness, memory loss and night sweats.  Hematological: Negative for swollen glands.  Psychiatric/Behavioral: Positive for sleep disturbance. Negative for depressed mood. The patient is not nervous/anxious.     PMFS History:  Patient Active Problem List   Diagnosis Date Noted  . Autoimmune disease ([place]) +ANA +Ro +La +RF oral ulcers nasal ulcers Raynaud photosensitivity SICCA  01/13/2017  . High risk medication use 01/13/2017  . Photosensitivity 01/13/2017  . Sjogren's syndrome with keratoconjunctivitis sicca ([place]) 01/13/2017  . Recurrent oral ulcers and nasal ulcers  01/13/2017  . Raynaud's disease without gangrene 01/13/2017  . Primary osteoarthritis of both hands 01/13/2017  . Primary osteoarthritis of both knees 01/13/2017  . Primary osteoarthritis of both feet 01/13/2017  . Osteopenia of multiple sites 01/13/2017  . History of hypertension 01/13/2017  . History of hypothyroidism 01/13/2017  . History of gastroesophageal reflux (GERD)/ PUD 01/13/2017  . History of MRSA infection 01/13/2017  . Diarrhea 05/28/2013  . Family history of malignant neoplasm of gastrointestinal tract 06/22/2011  .  ADENOCARCINOMA, BREAST 05/02/2009  . CORONARY ARTERY DISEASE 05/02/2009  . DYSPHAGIA UNSPECIFIED 05/02/2009    Past Medical  History:  Diagnosis Date  . Anxiety   . Arthritis   . Atrial fibrillation ([place])   . Autoimmune disorder ([place])    SJORGREN'S  . Breast cancer ([place])    right breast  . Cardiac arrhythmia due to congenital heart disease   . Chronic headaches   . Depression   . GERD (gastroesophageal reflux disease)   . Hypertension   . Myocardial infarction ([place])    1990  . Osteoporosis   . Status post dilation of esophageal narrowing   . Thyroid disease   . Ulcer     Family History  Problem Relation Age of Onset  . Breast cancer Mother   . Lung cancer Mother   . Cancer Father        larynx  . Heart failure Father   . Heart disease Brother   . Colon cancer Brother   . Pseudochol deficiency Other   . Breast cancer Sister   . Cancer Sister        thyroid   . Breast cancer Maternal Grandmother   . Autoimmune disease Daughter   . Cancer Son        prostate   . Esophageal cancer Neg Hx   . Rectal cancer Neg Hx    Past Surgical History:  Procedure Laterality Date  . APPENDECTOMY    . breast reconstuction     x 2  . CHOLECYSTECTOMY    . COLONOSCOPY    . double mastectomy    . PARTIAL HYSTERECTOMY  1990  . TIBIA FRACTURE SURGERY Right    with subsequent hardware removal  . UPPER GASTROINTESTINAL ENDOSCOPY    . WRIST FRACTURE SURGERY Left    Social History   Social History Narrative   1 cup of coffee daily     Objective: Vital Signs: BP (!) 144/78 (BP Location: Left Arm, Patient Position: Sitting, Cuff Size: Normal)   Pulse 64   Resp 18   Ht 5\' 6"  (1.676 m)   Wt 145 lb (65.8 kg)   BMI 23.40 kg/m    Physical Exam  Constitutional: She is oriented to person, place, and time. She appears well-developed and well-nourished.  HENT:  Head: Normocephalic and atraumatic.  Eyes: Conjunctivae and EOM are normal.  Neck: Normal range of motion.  Cardiovascular: Normal rate, regular rhythm, normal heart sounds and intact distal pulses.  Pulmonary/Chest: Effort normal and breath  sounds normal.  Abdominal: Soft. Bowel sounds are normal.  Lymphadenopathy:    She has no cervical adenopathy.  Neurological: She is alert and oriented to person, place, and time.  Skin: Skin is warm and dry. Capillary refill takes less than 2 seconds.  Bandaged wound overlying extensor surface of left elbow   Psychiatric: She has a normal mood and affect. Her behavior is normal.  Nursing note and vitals reviewed.    Musculoskeletal Exam: C-spine and thoracic lumbar spine good range of motion. Shoulder joints elbow joints wrist joints are good range of motion. She has some DIP thickening in her hands consistent with osteoarthritis no active synovitis was noted. She has swelling on the dorsum of her aspect of her right hand with bruising on dorsum of hand and palm. Hip joints knee joints ankles MTPs PIPs DIPs with good range of motion with no synovitis.  CDAI Exam: No CDAI exam completed.    Investigation: No additional findings.PLQ eye exam:  03/30/2017,  12/24/2016 labs from her PCP showed CBC WBC 3.6, CMP normal 08/18/2016 and rheumatoid factor 18, SPEP was negative., Sedimentation rate 17, C3-C4 normal CBC Latest Ref Rng & Units 08/18/2016 03/28/2016  WBC 3.8 - 10.8 K/uL 3.5(L) 6.7  Hemoglobin 11.7 - 15.5 g/dL 12.7 13.5  Hematocrit 35.0 - 45.0 % 38.7 39.5  Platelets 140 - 400 K/uL 189 164   CMP Latest Ref Rng & Units 08/18/2016 03/28/2016  Glucose 65 - 99 mg/dL 81 85  BUN 7 - 25 mg/dL 19 12  Creatinine 0.60 - 0.93 mg/dL 0.87 0.92  Sodium 135 - 146 mmol/L 133(L) 130(L)  Potassium 3.5 - 5.3 mmol/L 3.7 3.7  Chloride 98 - 110 mmol/L 97(L) 94(L)  CO2 20 - 31 mmol/L 27 29  Calcium 8.6 - 10.4 mg/dL 10.0 9.6  Total Protein 6.1 - 8.1 g/dL 7.0 7.3  Total Bilirubin 0.2 - 1.2 mg/dL 0.4 0.5  Alkaline Phos 33 - 130 U/L 51 53  AST 10 - 35 U/L 31 38  ALT 6 - 29 U/L 13 18    Imaging: Xr Hand Complete Right  Result Date: 06/22/2017 Right MCP neck fracture of 4th and 5th with angulation and  shortening.    Speciality Comments: No specialty comments available.    Procedures:  No procedures performed Allergies: Robinul [glycopyrrolate]; Sulfamethoxazole-trimethoprim; and Codeine   Assessment / Plan:     Visit Diagnoses: Autoimmune disease ([place]) +ANA +Ro +La +RF oral ulcers nasal ulcers Raynaud photosensitivity SICCA : She continues to have some sicca symptoms. She also reports recent episode of nasal ulcers. Raynolds is mildly active. She's been tolerating Plaquenil one a day. I do not see any synovitis on examination.  High risk medication use - PLQ 200 mg po qd.eye exam: 03/30/2017. Her labs at been stable except for mild neutropenia.  She will be getting her autoimmune labs when she gets her routine labs with her PCP.  Results will be sent to [name].   Rash: She has states she has recurrence of rash for which she is seen by PCP and was started on doxycycline and Bactroban.  Sjogren's syndrome with keratoconjunctivitis sicca ([place]): She's been using over-the-counter products and drinking of fluids.  Raynaud's disease without gangrene: She is using warm clothing.  Hand injury, right, sequela: She had a fall yesterday which resulted in  bruising on her face and her right hand swelling and bruising. XR revealed a fracture of the neck of 4th and 5th MCPs of the right hand.  Patient was splinted in the office by Aaron Edelman.  She will follow-up with Dr. Durward Fortes on Monday.  Plan: XR hand complete right  Headache: Patient has some bruising and pain surrounding right side of forehead after falling yesterday. Denies visual changes. Patient was instructed to follow-up with her PCP if she continues to have headaches.    Primary osteoarthritis of both hands - She does have DIP PIP changes consistent with osteoarthritis : Some discomfort  Primary osteoarthritis of both knees: Doing fairly well  Primary osteoarthritis of both feet: Proper fitting shoes were discussed.  Osteopenia of multiple  sites - on [place]  History of MRSA infection: Patient is currently on Doxycycline.   Other medical issues listed below:   History of hypothyroidism  History of hypertension  History of gastroesophageal reflux (GERD)/ PUD    Orders: Orders Placed This Encounter  Procedures  . XR Hand Complete Right   No orders of the defined types were placed in this encounter.  Face-to-face time spent with patient was 30 minutes. Greater than 50% of time was spent in counseling and coordination of care.  Follow-Up Instructions: Return in about 5 months (around 11/20/2017) for Autoimmune disease.   Bo Merino, MD  Note - This record has been created using Editor, commissioning.  Chart creation errors have been sought, but may not always  have been located. Such creation errors do not reflect on  the standard of medical care.

## 2017-06-21 DIAGNOSIS — E038 Other specified hypothyroidism: Secondary | ICD-10-CM | POA: Diagnosis not present

## 2017-06-21 DIAGNOSIS — S60921A Unspecified superficial injury of right hand, initial encounter: Secondary | ICD-10-CM | POA: Diagnosis not present

## 2017-06-21 DIAGNOSIS — R05 Cough: Secondary | ICD-10-CM | POA: Diagnosis not present

## 2017-06-21 DIAGNOSIS — J069 Acute upper respiratory infection, unspecified: Secondary | ICD-10-CM | POA: Diagnosis not present

## 2017-06-21 DIAGNOSIS — Z6823 Body mass index (BMI) 23.0-23.9, adult: Secondary | ICD-10-CM | POA: Diagnosis not present

## 2017-06-21 DIAGNOSIS — W0110XA Fall on same level from slipping, tripping and stumbling with subsequent striking against unspecified object, initial encounter: Secondary | ICD-10-CM | POA: Diagnosis not present

## 2017-06-21 DIAGNOSIS — J40 Bronchitis, not specified as acute or chronic: Secondary | ICD-10-CM | POA: Diagnosis not present

## 2017-06-21 DIAGNOSIS — R531 Weakness: Secondary | ICD-10-CM | POA: Diagnosis not present

## 2017-06-22 ENCOUNTER — Encounter: Payer: Self-pay | Admitting: Rheumatology

## 2017-06-22 ENCOUNTER — Ambulatory Visit (INDEPENDENT_AMBULATORY_CARE_PROVIDER_SITE_OTHER): Payer: Self-pay

## 2017-06-22 ENCOUNTER — Encounter (INDEPENDENT_AMBULATORY_CARE_PROVIDER_SITE_OTHER): Payer: Self-pay | Admitting: Orthopedic Surgery

## 2017-06-22 ENCOUNTER — Ambulatory Visit (INDEPENDENT_AMBULATORY_CARE_PROVIDER_SITE_OTHER): Payer: PPO

## 2017-06-22 ENCOUNTER — Ambulatory Visit (INDEPENDENT_AMBULATORY_CARE_PROVIDER_SITE_OTHER): Payer: PPO | Admitting: Orthopedic Surgery

## 2017-06-22 ENCOUNTER — Ambulatory Visit: Payer: PPO | Admitting: Rheumatology

## 2017-06-22 VITALS — BP 144/78 | HR 64 | Resp 18 | Ht 66.0 in | Wt 145.0 lb

## 2017-06-22 DIAGNOSIS — Z8639 Personal history of other endocrine, nutritional and metabolic disease: Secondary | ICD-10-CM

## 2017-06-22 DIAGNOSIS — D8989 Other specified disorders involving the immune mechanism, not elsewhere classified: Secondary | ICD-10-CM | POA: Diagnosis not present

## 2017-06-22 DIAGNOSIS — I7300 Raynaud's syndrome without gangrene: Secondary | ICD-10-CM

## 2017-06-22 DIAGNOSIS — S62336A Displaced fracture of neck of fifth metacarpal bone, right hand, initial encounter for closed fracture: Secondary | ICD-10-CM | POA: Diagnosis not present

## 2017-06-22 DIAGNOSIS — M3501 Sicca syndrome with keratoconjunctivitis: Secondary | ICD-10-CM

## 2017-06-22 DIAGNOSIS — M170 Bilateral primary osteoarthritis of knee: Secondary | ICD-10-CM

## 2017-06-22 DIAGNOSIS — S6291XA Unspecified fracture of right wrist and hand, initial encounter for closed fracture: Secondary | ICD-10-CM

## 2017-06-22 DIAGNOSIS — M79641 Pain in right hand: Secondary | ICD-10-CM | POA: Diagnosis not present

## 2017-06-22 DIAGNOSIS — Z79899 Other long term (current) drug therapy: Secondary | ICD-10-CM

## 2017-06-22 DIAGNOSIS — M8589 Other specified disorders of bone density and structure, multiple sites: Secondary | ICD-10-CM

## 2017-06-22 DIAGNOSIS — S6991XS Unspecified injury of right wrist, hand and finger(s), sequela: Secondary | ICD-10-CM

## 2017-06-22 DIAGNOSIS — R21 Rash and other nonspecific skin eruption: Secondary | ICD-10-CM | POA: Diagnosis not present

## 2017-06-22 DIAGNOSIS — S62334A Displaced fracture of neck of fourth metacarpal bone, right hand, initial encounter for closed fracture: Secondary | ICD-10-CM

## 2017-06-22 DIAGNOSIS — M19041 Primary osteoarthritis, right hand: Secondary | ICD-10-CM | POA: Diagnosis not present

## 2017-06-22 DIAGNOSIS — M359 Systemic involvement of connective tissue, unspecified: Secondary | ICD-10-CM

## 2017-06-22 DIAGNOSIS — Z8719 Personal history of other diseases of the digestive system: Secondary | ICD-10-CM

## 2017-06-22 DIAGNOSIS — Z8614 Personal history of Methicillin resistant Staphylococcus aureus infection: Secondary | ICD-10-CM

## 2017-06-22 DIAGNOSIS — M19042 Primary osteoarthritis, left hand: Secondary | ICD-10-CM

## 2017-06-22 DIAGNOSIS — S62335A Displaced fracture of neck of fourth metacarpal bone, left hand, initial encounter for closed fracture: Secondary | ICD-10-CM

## 2017-06-22 DIAGNOSIS — M19071 Primary osteoarthritis, right ankle and foot: Secondary | ICD-10-CM | POA: Diagnosis not present

## 2017-06-22 DIAGNOSIS — Z8679 Personal history of other diseases of the circulatory system: Secondary | ICD-10-CM

## 2017-06-22 DIAGNOSIS — M19072 Primary osteoarthritis, left ankle and foot: Secondary | ICD-10-CM

## 2017-06-22 NOTE — Patient Instructions (Signed)
Please get following labs through your PCP: CBC, CMP, UA, RF, SPEP, C3-C4, dsDNA, ESR

## 2017-06-22 NOTE — Progress Notes (Signed)
PLQ Eye exam: Assessed 03/2017, uploaded in media file

## 2017-06-22 NOTE — Progress Notes (Signed)
Office Visit Note   Patient: Alice Medina           Date of Birth: [birth year]           MRN: [id number] Visit Date: 06/22/2017              Requested by: Shon Baton, [address], [place] [address] PCP: Shon Baton, MD   Assessment & Plan: Visit Diagnoses:  1. Closed fracture of right hand, initial encounter   2. Closed displaced fracture of neck of fifth metacarpal bone of right hand, initial encounter   3. Closed displaced fracture of neck of fourth metacarpal bone of left hand, initial encounter     Plan:  #1: Attempted at closed reduction of the fourth and fifth metacarpal necks was performed. Length was not obtained but angulation was in good position. #2: We'll follow back up on Monday for recheck evaluation and consider at that time possible operative intervention.  Follow-Up Instructions: Return in about 5 days (around 06/27/2017).   Orders:  Orders Placed This Encounter  Procedures  . Splinting  . XR Hand Complete Right   No orders of the defined types were placed in this encounter.     Procedures: Splinting Date/Time: 06/22/2017 4:49 PM Performed by: Cherylann Ratel, PA-C Authorized by: Cherylann Ratel, PA-C   Consent Given by:  Patient Timeout: prior to procedure the correct patient, procedure, and site was verified   Location:  Hand  right hand Fracture Type: fourth metacarpal and fifth metacarpal   Distal Perfusion: normal   Distal Sensation: normal   Range of Motion: reduced   Manipulation Performed?: Yes   Skin Traction Used?: No   Reduction Successful?: No (Improved position the remaining short)   Confirmation: reduction confirmed by x-ray   Immobilization:  Splint Is this the patient's first splint for this injury?: Yes  Splint/Brace Type:  Ulnar gutter Supplies Used:  Fiberglass Distal Perfusion: normal   Distal Sensation: normal   Range of Motion: improved   Patient tolerance:  Patient tolerated the procedure well with  no immediate complications         Clinical Data: No additional findings.   Subjective: Chief Complaint  Patient presents with  . Right Hand - Pain, Injury    HPI  76 year old white female fell yesterday while walking on her carpet injuring her face and right hand.  Saw PCP/NP and was told there was nothing wrong with her hand.  She was seen by Dr Kathee Delton  And an xray was performed revealing fractures of the distal 4th and 5th metacarplal with shortening and angulation. Asked to see her for evaluation  Review of Systems  Negative except for fatigue, dry mouth and eyes, as well as hypertension    Objective: Vital Signs:  P-64, HT-5'6", WT-145, BP-144/78  Physical Exam  Constitutional: She is oriented to person, place, and time. She appears well-developed and well-nourished.  HENT:  Head: Normocephalic and atraumatic.  Eyes: EOM are normal. Pupils are equal, round, and reactive to light.  Pulmonary/Chest: Effort normal.  Neurological: She is alert and oriented to person, place, and time.  Skin: Skin is warm and dry.  Psychiatric: She has a normal mood and affect. Her behavior is normal. Judgment and thought content normal.    Ortho Exam  Decreased ROM and tenderness of ring and little finger at MCP secondary to pain.  Positive for swelling. Cap refill less han 2 seconds  Specialty Comments:  No specialty  comments available.  Imaging: Xr Hand Complete Right  Result Date: 06/22/2017 3 view xray reveals 4th and 5th metacarpal neck  fractures with shortening but improved  angulation.   Xr Hand Complete Right  Result Date: 06/22/2017 3 view x-rays reveals Right MCP neck fracture of 4th and 5th with angulation and shortening.     PMFS History: Patient Active Problem List   Diagnosis Date Noted  . Autoimmune disease ([place]) +ANA +Ro +La +RF oral ulcers nasal ulcers Raynaud photosensitivity SICCA  01/13/2017  . High risk medication use 01/13/2017  .  Photosensitivity 01/13/2017  . Sjogren's syndrome with keratoconjunctivitis sicca ([place]) 01/13/2017  . Recurrent oral ulcers and nasal ulcers  01/13/2017  . Raynaud's disease without gangrene 01/13/2017  . Primary osteoarthritis of both hands 01/13/2017  . Primary osteoarthritis of both knees 01/13/2017  . Primary osteoarthritis of both feet 01/13/2017  . Osteopenia of multiple sites 01/13/2017  . History of hypertension 01/13/2017  . History of hypothyroidism 01/13/2017  . History of gastroesophageal reflux (GERD)/ PUD 01/13/2017  . History of MRSA infection 01/13/2017  . Diarrhea 05/28/2013  . Family history of malignant neoplasm of gastrointestinal tract 06/22/2011  . ADENOCARCINOMA, BREAST 05/02/2009  . CORONARY ARTERY DISEASE 05/02/2009  . DYSPHAGIA UNSPECIFIED 05/02/2009   Past Medical History:  Diagnosis Date  . Anxiety   . Arthritis   . Atrial fibrillation ([place])   . Autoimmune disorder ([place])    SJORGREN'S  . Breast cancer ([place])    right breast  . Cardiac arrhythmia due to congenital heart disease   . Chronic headaches   . Depression   . GERD (gastroesophageal reflux disease)   . Hypertension   . Myocardial infarction ([place])    1990  . Osteoporosis   . Status post dilation of esophageal narrowing   . Thyroid disease   . Ulcer     Family History  Problem Relation Age of Onset  . Breast cancer Mother   . Lung cancer Mother   . Cancer Father        larynx  . Heart failure Father   . Heart disease Brother   . Colon cancer Brother   . Pseudochol deficiency Other   . Breast cancer Sister   . Cancer Sister        thyroid   . Breast cancer Maternal Grandmother   . Autoimmune disease Daughter   . Cancer Son        prostate   . Esophageal cancer Neg Hx   . Rectal cancer Neg Hx     Past Surgical History:  Procedure Laterality Date  . APPENDECTOMY    . breast reconstuction     x 2  . CHOLECYSTECTOMY    . COLONOSCOPY    . double mastectomy    . PARTIAL  HYSTERECTOMY  1990  . TIBIA FRACTURE SURGERY Right    with subsequent hardware removal  . UPPER GASTROINTESTINAL ENDOSCOPY    . WRIST FRACTURE SURGERY Left    Social History   Occupational History  . Occupation: retired Corporate treasurer  Tobacco Use  . Smoking status: Never Smoker  . Smokeless tobacco: Never Used  Substance and Sexual Activity  . Alcohol use: Yes    Comment: very rare  . Drug use: No  . Sexual activity: Not on file

## 2017-06-24 DIAGNOSIS — E871 Hypo-osmolality and hyponatremia: Secondary | ICD-10-CM | POA: Diagnosis not present

## 2017-06-27 ENCOUNTER — Encounter (INDEPENDENT_AMBULATORY_CARE_PROVIDER_SITE_OTHER): Payer: Self-pay | Admitting: Orthopaedic Surgery

## 2017-06-27 ENCOUNTER — Ambulatory Visit (INDEPENDENT_AMBULATORY_CARE_PROVIDER_SITE_OTHER): Payer: Self-pay

## 2017-06-27 ENCOUNTER — Ambulatory Visit (INDEPENDENT_AMBULATORY_CARE_PROVIDER_SITE_OTHER): Payer: PPO | Admitting: Orthopaedic Surgery

## 2017-06-27 VITALS — BP 120/62 | HR 64 | Resp 14 | Ht 66.0 in | Wt 155.0 lb

## 2017-06-27 DIAGNOSIS — M79641 Pain in right hand: Secondary | ICD-10-CM

## 2017-06-27 DIAGNOSIS — S62304A Unspecified fracture of fourth metacarpal bone, right hand, initial encounter for closed fracture: Secondary | ICD-10-CM

## 2017-06-27 DIAGNOSIS — S62306A Unspecified fracture of fifth metacarpal bone, right hand, initial encounter for closed fracture: Secondary | ICD-10-CM | POA: Diagnosis not present

## 2017-06-27 NOTE — Progress Notes (Signed)
Office Visit Note   Patient: Alice Medina           Date of Birth: 10/28/1940           MRN: [id number] Visit Date: 06/27/2017              Requested by: Shon Baton, [address], [place] [address] PCP: Shon Baton, MD   Assessment & Plan: Visit Diagnoses:  1. Pain in right hand     Plan: 5 days status post manipulation of fourth and fifth metacarpal fractures of the right hand. Doing well in splint. Comes reveal acceptable position. We'll plan to reevaluate with repeat films in 1 week patient is comfortable  Follow-Up Instructions: Return in about 1 week (around 07/04/2017).   Orders:  Orders Placed This Encounter  Procedures  . XR Hand Complete Right   No orders of the defined types were placed in this encounter.     Procedures: No procedures performed   Clinical Data: No additional findings.   Subjective: Chief Complaint  Patient presents with  . Right Hand - Pain, Fracture    Alice Medina is a 76 y o here for F/U closed Fx of right hand   Comfortable in splint without any related problems HPI  Review of Systems  Constitutional: Negative for chills, fatigue and fever.  Eyes: Negative for itching.  Respiratory: Negative for chest tightness and shortness of breath.   Cardiovascular: Negative for chest pain, palpitations and leg swelling.  Gastrointestinal: Negative for blood in stool, constipation and diarrhea.  Endocrine: Negative for polyuria.  Genitourinary: Negative for dysuria.  Musculoskeletal: Negative for back pain, joint swelling, neck pain and neck stiffness.  Allergic/Immunologic: Negative for immunocompromised state.  Neurological: Positive for headaches. Negative for dizziness and numbness.  Hematological: Does not bruise/bleed easily.  Psychiatric/Behavioral: The patient is not nervous/anxious.      Objective: Vital Signs: BP 120/62   Pulse 64   Resp 14   Ht 5\' 6"  (1.676 m)   Wt 155 lb (70.3 kg)   BMI 25.02 kg/m    Physical Exam  Ortho Exam awake alert and oriented 3. Comfortable sitting. Splint right hand intact with good sensibility to the tips of the fingers.  Specialty Comments:  No specialty comments available.  Imaging: Xr Hand Complete Right  Result Date: 06/27/2017 Films of the right hand demonstrate the previously identified fractures of distal end of the fourth and fifth metatarsals. The angulation has been corrected. There is some shortening related to a short oblique fracture but I think they're in good position    PMFS History: Patient Active Problem List   Diagnosis Date Noted  . Autoimmune disease ([place]) +ANA +Ro +La +RF oral ulcers nasal ulcers Raynaud photosensitivity SICCA  01/13/2017  . High risk medication use 01/13/2017  . Photosensitivity 01/13/2017  . Sjogren's syndrome with keratoconjunctivitis sicca ([place]) 01/13/2017  . Recurrent oral ulcers and nasal ulcers  01/13/2017  . Raynaud's disease without gangrene 01/13/2017  . Primary osteoarthritis of both hands 01/13/2017  . Primary osteoarthritis of both knees 01/13/2017  . Primary osteoarthritis of both feet 01/13/2017  . Osteopenia of multiple sites 01/13/2017  . History of hypertension 01/13/2017  . History of hypothyroidism 01/13/2017  . History of gastroesophageal reflux (GERD)/ PUD 01/13/2017  . History of MRSA infection 01/13/2017  . Diarrhea 05/28/2013  . Family history of malignant neoplasm of gastrointestinal tract 06/22/2011  . ADENOCARCINOMA, BREAST 05/02/2009  . CORONARY ARTERY DISEASE 05/02/2009  . DYSPHAGIA UNSPECIFIED  05/02/2009   Past Medical History:  Diagnosis Date  . Anxiety   . Arthritis   . Atrial fibrillation ([place])   . Autoimmune disorder ([place])    SJORGREN'S  . Breast cancer ([place])    right breast  . Cardiac arrhythmia due to congenital heart disease   . Chronic headaches   . Depression   . GERD (gastroesophageal reflux disease)   . Hypertension   . Myocardial infarction ([place])     1990  . Osteoporosis   . Status post dilation of esophageal narrowing   . Thyroid disease   . Ulcer     Family History  Problem Relation Age of Onset  . Breast cancer Mother   . Lung cancer Mother   . Cancer Father        larynx  . Heart failure Father   . Heart disease Brother   . Colon cancer Brother   . Pseudochol deficiency Other   . Breast cancer Sister   . Cancer Sister        thyroid   . Breast cancer Maternal Grandmother   . Autoimmune disease Daughter   . Cancer Son        prostate   . Esophageal cancer Neg Hx   . Rectal cancer Neg Hx     Past Surgical History:  Procedure Laterality Date  . APPENDECTOMY    . breast reconstuction     x 2  . CHOLECYSTECTOMY    . COLONOSCOPY    . double mastectomy    . PARTIAL HYSTERECTOMY  1990  . TIBIA FRACTURE SURGERY Right    with subsequent hardware removal  . UPPER GASTROINTESTINAL ENDOSCOPY    . WRIST FRACTURE SURGERY Left    Social History   Occupational History  . Occupation: retired Corporate treasurer  Tobacco Use  . Smoking status: Never Smoker  . Smokeless tobacco: Never Used  Substance and Sexual Activity  . Alcohol use: Yes    Comment: very rare  . Drug use: No  . Sexual activity: Not on file

## 2017-06-28 DIAGNOSIS — D8989 Other specified disorders involving the immune mechanism, not elsewhere classified: Secondary | ICD-10-CM | POA: Diagnosis not present

## 2017-06-28 DIAGNOSIS — E871 Hypo-osmolality and hyponatremia: Secondary | ICD-10-CM | POA: Diagnosis not present

## 2017-06-28 DIAGNOSIS — R2689 Other abnormalities of gait and mobility: Secondary | ICD-10-CM | POA: Diagnosis not present

## 2017-06-28 DIAGNOSIS — M810 Age-related osteoporosis without current pathological fracture: Secondary | ICD-10-CM | POA: Diagnosis not present

## 2017-06-28 DIAGNOSIS — R413 Other amnesia: Secondary | ICD-10-CM | POA: Diagnosis not present

## 2017-06-28 DIAGNOSIS — E038 Other specified hypothyroidism: Secondary | ICD-10-CM | POA: Diagnosis not present

## 2017-06-28 DIAGNOSIS — Z6824 Body mass index (BMI) 24.0-24.9, adult: Secondary | ICD-10-CM | POA: Diagnosis not present

## 2017-06-28 DIAGNOSIS — M3210 Systemic lupus erythematosus, organ or system involvement unspecified: Secondary | ICD-10-CM | POA: Diagnosis not present

## 2017-06-28 DIAGNOSIS — R82998 Other abnormal findings in urine: Secondary | ICD-10-CM | POA: Diagnosis not present

## 2017-06-28 DIAGNOSIS — I1310 Hypertensive heart and chronic kidney disease without heart failure, with stage 1 through stage 4 chronic kidney disease, or unspecified chronic kidney disease: Secondary | ICD-10-CM | POA: Diagnosis not present

## 2017-07-04 ENCOUNTER — Ambulatory Visit (INDEPENDENT_AMBULATORY_CARE_PROVIDER_SITE_OTHER): Payer: PPO | Admitting: Orthopaedic Surgery

## 2017-07-11 ENCOUNTER — Encounter (INDEPENDENT_AMBULATORY_CARE_PROVIDER_SITE_OTHER): Payer: Self-pay | Admitting: Orthopaedic Surgery

## 2017-07-11 ENCOUNTER — Ambulatory Visit (INDEPENDENT_AMBULATORY_CARE_PROVIDER_SITE_OTHER): Payer: PPO

## 2017-07-11 ENCOUNTER — Ambulatory Visit (INDEPENDENT_AMBULATORY_CARE_PROVIDER_SITE_OTHER): Payer: PPO | Admitting: Orthopaedic Surgery

## 2017-07-11 DIAGNOSIS — M79641 Pain in right hand: Secondary | ICD-10-CM

## 2017-07-11 NOTE — Progress Notes (Signed)
Office Visit Note   Patient: Alice Medina           Date of Birth: 1940/10/17           MRN: [id number] Visit Date: 07/11/2017              Requested by: Shon Baton, [place] [address] [place], [place] [id number] PCP: Shon Baton, MD   Assessment & Plan: Visit Diagnoses:  1. Pain of right hand     Plan: 3 weeks status post closed manipulation of right fourth and fifth metacarpal fractures and doing well. Relates having a little bit of "numbness" in her ulnar 2 digits but otherwise comfortable in the splint. Films reveal good position of the fractures will remove splint and begin range of motion excises. Buddy tape the ulnar 2 digits and see her back in 2 weeks  Follow-Up Instructions: No Follow-up on file.   Orders:  Orders Placed This Encounter  Procedures  . XR Hand Complete Right   No orders of the defined types were placed in this encounter.     Procedures: No procedures performed   Clinical Data: No additional findings.   Subjective: No chief complaint on file. 3 weeks status post manipulation of closed, displaced fractures of the right fourth and fifth metacarpals. Relatively comfortable. Some numbness in her ulnar 2 digits that "comes and goes. No pain  HPI  Review of Systems   Objective: Vital Signs: There were no vitals taken for this visit.  Physical Exam  Ortho Exam splint removed. Good capillary refill to all fingers. Animal tenderness over the fifth metacarpals. No redness. Skin intact no numbness today fingers are getting stiff so she needs to work on range of motion out of the splint No specialty comments available.  Imaging: No results found.   PMFS History: Patient Active Problem List   Diagnosis Date Noted  . Autoimmune disease ([place]) +ANA +Ro +La +RF oral ulcers nasal ulcers Raynaud photosensitivity SICCA  01/13/2017  . High risk medication use 01/13/2017  . Photosensitivity 01/13/2017  . Sjogren's syndrome with  keratoconjunctivitis sicca ([place]) 01/13/2017  . Recurrent oral ulcers and nasal ulcers  01/13/2017  . Raynaud's disease without gangrene 01/13/2017  . Primary osteoarthritis of both hands 01/13/2017  . Primary osteoarthritis of both knees 01/13/2017  . Primary osteoarthritis of both feet 01/13/2017  . Osteopenia of multiple sites 01/13/2017  . History of hypertension 01/13/2017  . History of hypothyroidism 01/13/2017  . History of gastroesophageal reflux (GERD)/ PUD 01/13/2017  . History of MRSA infection 01/13/2017  . Diarrhea 05/28/2013  . Family history of malignant neoplasm of gastrointestinal tract 06/22/2011  . ADENOCARCINOMA, BREAST 05/02/2009  . CORONARY ARTERY DISEASE 05/02/2009  . DYSPHAGIA UNSPECIFIED 05/02/2009   Past Medical History:  Diagnosis Date  . Anxiety   . Arthritis   . Atrial fibrillation ([place])   . Autoimmune disorder ([place])    SJORGREN'S  . Breast cancer ([place])    right breast  . Cardiac arrhythmia due to congenital heart disease   . Chronic headaches   . Depression   . GERD (gastroesophageal reflux disease)   . Hypertension   . Myocardial infarction ([place])    1990  . Osteoporosis   . Status post dilation of esophageal narrowing   . Thyroid disease   . Ulcer     Family History  Problem Relation Age of Onset  . Breast cancer Mother   . Lung cancer Mother   . Cancer Father  larynx  . Heart failure Father   . Heart disease Brother   . Colon cancer Brother   . Pseudochol deficiency Other   . Breast cancer Sister   . Cancer Sister        thyroid   . Breast cancer Maternal Grandmother   . Autoimmune disease Daughter   . Cancer Son        prostate   . Esophageal cancer Neg Hx   . Rectal cancer Neg Hx     Past Surgical History:  Procedure Laterality Date  . APPENDECTOMY    . breast reconstuction     x 2  . CHOLECYSTECTOMY    . COLONOSCOPY    . double mastectomy    . PARTIAL HYSTERECTOMY  1990  . TIBIA FRACTURE SURGERY Right    with  subsequent hardware removal  . UPPER GASTROINTESTINAL ENDOSCOPY    . WRIST FRACTURE SURGERY Left    Social History   Occupational History  . Occupation: retired Corporate treasurer  Tobacco Use  . Smoking status: Never Smoker  . Smokeless tobacco: Never Used  Substance and Sexual Activity  . Alcohol use: Yes    Comment: very rare  . Drug use: No  . Sexual activity: Not on file     Garald Balding, MD   Note - This record has been created using [employer].  Chart creation errors have been sought, but may not always  have been located. Such creation errors do not reflect on  the standard of medical care.

## 2017-07-14 DIAGNOSIS — H16223 Keratoconjunctivitis sicca, not specified as Sjogren's, bilateral: Secondary | ICD-10-CM | POA: Diagnosis not present

## 2017-07-14 DIAGNOSIS — Z79899 Other long term (current) drug therapy: Secondary | ICD-10-CM | POA: Diagnosis not present

## 2017-07-14 DIAGNOSIS — H16143 Punctate keratitis, bilateral: Secondary | ICD-10-CM | POA: Diagnosis not present

## 2017-07-14 DIAGNOSIS — M3500 Sicca syndrome, unspecified: Secondary | ICD-10-CM | POA: Diagnosis not present

## 2017-07-14 DIAGNOSIS — Z09 Encounter for follow-up examination after completed treatment for conditions other than malignant neoplasm: Secondary | ICD-10-CM | POA: Diagnosis not present

## 2017-07-14 DIAGNOSIS — Z049 Encounter for examination and observation for unspecified reason: Secondary | ICD-10-CM | POA: Diagnosis not present

## 2017-07-14 DIAGNOSIS — E871 Hypo-osmolality and hyponatremia: Secondary | ICD-10-CM | POA: Diagnosis not present

## 2017-07-14 DIAGNOSIS — M329 Systemic lupus erythematosus, unspecified: Secondary | ICD-10-CM | POA: Diagnosis not present

## 2017-07-25 ENCOUNTER — Encounter (INDEPENDENT_AMBULATORY_CARE_PROVIDER_SITE_OTHER): Payer: Self-pay | Admitting: Orthopaedic Surgery

## 2017-07-25 ENCOUNTER — Ambulatory Visit (INDEPENDENT_AMBULATORY_CARE_PROVIDER_SITE_OTHER): Payer: PPO | Admitting: Orthopaedic Surgery

## 2017-07-25 VITALS — BP 123/67 | HR 71 | Resp 14 | Ht 66.0 in | Wt 150.0 lb

## 2017-07-25 DIAGNOSIS — S62309D Unspecified fracture of unspecified metacarpal bone, subsequent encounter for fracture with routine healing: Secondary | ICD-10-CM

## 2017-07-25 DIAGNOSIS — S62309A Unspecified fracture of unspecified metacarpal bone, initial encounter for closed fracture: Secondary | ICD-10-CM | POA: Insufficient documentation

## 2017-07-25 HISTORY — DX: Unspecified fracture of unspecified metacarpal bone, initial encounter for closed fracture: S62.309A

## 2017-07-25 NOTE — Progress Notes (Signed)
Office Visit Note   Patient: Alice Medina           Date of Birth: 1941-03-18           MRN: [id number] Visit Date: 07/25/2017              Requested by: Shon Baton, [place], [place] [address] PCP: Shon Baton, MD   Assessment & Plan: Visit Diagnoses:  1. Multiple closed fractures of single metacarpal bone with routine healing, subsequent encounter     Plan: 5 weeks status post minimally displaced fractures of the right fourth and fifth metacarpals and doing well. No significant pain or deformity. He has reestablish full range of motion. We'll encourage her to continue working on strengthening and return to see me on a when necessary basis  Follow-Up Instructions: Return if symptoms worsen or fail to improve.   Orders:  No orders of the defined types were placed in this encounter.  No orders of the defined types were placed in this encounter.     Procedures: No procedures performed   Clinical Data: No additional findings.   Subjective: Chief Complaint  Patient presents with  . Right Hand - Routine Post Op, Follow-up    Closed displaced fracture of neck of fifth metacarpal bone of right hand when she fell 06/21/2017. Pt is doing well, no numbness in fingers, some edema.  5 weeks post injury. 2 weeks out of the splint. Doing well no numbness in her fingers minimal swelling that does not limit her function. No complaints  HPI  Review of Systems  Constitutional: Negative for chills, fatigue and fever.  Eyes: Negative for itching.  Respiratory: Negative for chest tightness and shortness of breath.   Cardiovascular: Negative for chest pain, palpitations and leg swelling.  Gastrointestinal: Negative for blood in stool, constipation and diarrhea.  Endocrine: Negative for polyuria.  Genitourinary: Negative for dysuria.  Musculoskeletal: Positive for back pain, joint swelling, neck pain and neck stiffness.  Allergic/Immunologic: Positive for  immunocompromised state.  Neurological: Negative for dizziness and numbness.  Hematological: Does not bruise/bleed easily.  Psychiatric/Behavioral: The patient is not nervous/anxious.      Objective: Vital Signs: BP 123/67   Pulse 71   Resp 14   Ht 5\' 6"  (1.676 m)   Wt 150 lb (68 kg)   BMI 24.21 kg/m   Physical Exam  Ortho Exam awake alert and oriented 3. Comfortable sitting. Examination of the right hand was full range of motion of all of her fingers and specifically the little and ring finger. Neurovascular exam intact. Good capillary refill. No obvious deformity. No pain over the fracture site. Good grip and release  Specialty Comments:  No specialty comments available.  Imaging: No results found.   PMFS History: Patient Active Problem List   Diagnosis Date Noted  . Multiple closed fractures of metacarpal bone 07/25/2017  . Autoimmune disease ([place]) +ANA +Ro +La +RF oral ulcers nasal ulcers Raynaud photosensitivity SICCA  01/13/2017  . High risk medication use 01/13/2017  . Photosensitivity 01/13/2017  . Sjogren's syndrome with keratoconjunctivitis sicca ([place]) 01/13/2017  . Recurrent oral ulcers and nasal ulcers  01/13/2017  . Raynaud's disease without gangrene 01/13/2017  . Primary osteoarthritis of both hands 01/13/2017  . Primary osteoarthritis of both knees 01/13/2017  . Primary osteoarthritis of both feet 01/13/2017  . Osteopenia of multiple sites 01/13/2017  . History of hypertension 01/13/2017  . History of hypothyroidism 01/13/2017  . History of gastroesophageal reflux (GERD)/ PUD  01/13/2017  . History of MRSA infection 01/13/2017  . Diarrhea 05/28/2013  . Family history of malignant neoplasm of gastrointestinal tract 06/22/2011  . ADENOCARCINOMA, BREAST 05/02/2009  . CORONARY ARTERY DISEASE 05/02/2009  . DYSPHAGIA UNSPECIFIED 05/02/2009   Past Medical History:  Diagnosis Date  . Anxiety   . Arthritis   . Atrial fibrillation ([place])   . Autoimmune  disorder ([place])    SJORGREN'S  . Breast cancer ([place])    right breast  . Cardiac arrhythmia due to congenital heart disease   . Chronic headaches   . Depression   . GERD (gastroesophageal reflux disease)   . Hypertension   . Myocardial infarction ([place])    1990  . Osteoporosis   . Status post dilation of esophageal narrowing   . Thyroid disease   . Ulcer     Family History  Problem Relation Age of Onset  . Breast cancer Mother   . Lung cancer Mother   . Cancer Father        larynx  . Heart failure Father   . Heart disease Brother   . Colon cancer Brother   . Pseudochol deficiency Other   . Breast cancer Sister   . Cancer Sister        thyroid   . Breast cancer Maternal Grandmother   . Autoimmune disease Daughter   . Cancer Son        prostate   . Esophageal cancer Neg Hx   . Rectal cancer Neg Hx     Past Surgical History:  Procedure Laterality Date  . APPENDECTOMY    . breast reconstuction     x 2  . CHOLECYSTECTOMY    . COLONOSCOPY    . double mastectomy    . PARTIAL HYSTERECTOMY  1990  . TIBIA FRACTURE SURGERY Right    with subsequent hardware removal  . UPPER GASTROINTESTINAL ENDOSCOPY    . WRIST FRACTURE SURGERY Left    Social History   Occupational History  . Occupation: retired Corporate treasurer  Tobacco Use  . Smoking status: Never Smoker  . Smokeless tobacco: Never Used  Substance and Sexual Activity  . Alcohol use: Yes    Comment: very rare  . Drug use: No  . Sexual activity: Not on file

## 2017-08-17 ENCOUNTER — Other Ambulatory Visit: Payer: Self-pay | Admitting: Rheumatology

## 2017-08-17 NOTE — Telephone Encounter (Addendum)
Last Visit: 06/22/17 Next Visit: 11/22/17 Labs: 12/24/16 WBC 3.6 CMP WNL PLQ eye exam: 03/30/2017  Okay to refill 30 day supply per Dr. Estanislado Pandy

## 2017-09-14 ENCOUNTER — Other Ambulatory Visit: Payer: Self-pay | Admitting: Rheumatology

## 2017-09-15 NOTE — Telephone Encounter (Signed)
Last Visit: 06/22/17 Next Visit: 11/22/17 Labs: 06/28/17 low sodium  PLQ eye exam: 03/30/2017  Okay to refill per Dr. Estanislado Pandy

## 2017-10-17 DIAGNOSIS — E785 Hyperlipidemia, unspecified: Secondary | ICD-10-CM | POA: Diagnosis not present

## 2017-10-17 DIAGNOSIS — I119 Hypertensive heart disease without heart failure: Secondary | ICD-10-CM | POA: Diagnosis not present

## 2017-10-17 DIAGNOSIS — I2510 Atherosclerotic heart disease of native coronary artery without angina pectoris: Secondary | ICD-10-CM | POA: Diagnosis not present

## 2017-10-17 DIAGNOSIS — E039 Hypothyroidism, unspecified: Secondary | ICD-10-CM | POA: Diagnosis not present

## 2017-10-17 DIAGNOSIS — Z79899 Other long term (current) drug therapy: Secondary | ICD-10-CM | POA: Diagnosis not present

## 2017-10-17 DIAGNOSIS — I480 Paroxysmal atrial fibrillation: Secondary | ICD-10-CM | POA: Diagnosis not present

## 2017-11-08 NOTE — Progress Notes (Signed)
Office Visit Note  Patient: Alice Medina             Date of Birth: 1941-07-20           MRN: [id number]             PCP: Shon Baton, MD Referring: Shon Baton, MD Visit Date: 11/22/2017 Occupation: @GUAROCC @    Subjective:  Hand and feet pain    History of Present Illness: Alice Medina is a 77 y.o. female with history of autoimmune disease, Sjogren's syndrome, Raynaud's, and osteoarthritis.  Patient takes Plaquenil 200 mg 1 tablet by mouth daily.  She denies any recent flares.  She continues to have sicca symptoms.  She states that she sees her dentist on a regular basis.  She states that she has an increased number of cavities lately.  She states she continues to use Biotene mouthwash.  She also is using Restasis eyedrops and refresh drops.  She states that she has very mild symptoms of Raynolds.  She denies any digital ulcerations.  She states that her hands will intermittently swell and cause discomfort.  She states that she feels her hands have become more stiff as well.  She denies any other joint swelling at this time.  She states she continues to have tenderness of bilateral trochanteric bursa.  She states she also has chronic lower back pain.  She states that she uses Voltaren gel at bedtime on her lower back which helps with the pain.  She states her bilateral knees are doing well.  She states her feet have been causing increased discomfort.  She states they are more painful if she is standing for prolonged periods of time.   Activities of Daily Living:  Patient reports morning stiffness for 10-15 minutes.   Patient Denies nocturnal pain.  Difficulty dressing/grooming: Denies Difficulty climbing stairs: Reports Difficulty getting out of chair: Reports Difficulty using hands for taps, buttons, cutlery, and/or writing: Denies   Review of Systems  Constitutional: Negative for fatigue.  HENT: Positive for mouth sores, mouth dryness and nose dryness (Nose sores).   Eyes:  Positive for dryness. Negative for pain and visual disturbance.  Respiratory: Negative for cough, hemoptysis, shortness of breath and difficulty breathing.   Cardiovascular: Positive for hypertension and swelling in legs/feet. Negative for chest pain and palpitations.  Gastrointestinal: Negative for blood in stool, constipation and diarrhea.  Endocrine: Negative for increased urination.  Genitourinary: Negative for painful urination.  Musculoskeletal: Positive for arthralgias, joint pain, joint swelling and morning stiffness. Negative for myalgias, muscle weakness, muscle tenderness and myalgias.  Skin: Negative for color change, pallor, rash, hair loss, nodules/bumps, skin tightness, ulcers and sensitivity to sunlight.  Allergic/Immunologic: Negative for susceptible to infections.  Neurological: Negative for dizziness, numbness, headaches and weakness.  Hematological: Negative for swollen glands.  Psychiatric/Behavioral: Negative for depressed mood and sleep disturbance. The patient is nervous/anxious (On Cymbalta and Ativan).     PMFS History:  Patient Active Problem List   Diagnosis Date Noted  . Multiple closed fractures of metacarpal bone 07/25/2017  . Autoimmune disease ([place]) +ANA +Ro +La +RF oral ulcers nasal ulcers Raynaud photosensitivity SICCA  01/13/2017  . High risk medication use 01/13/2017  . Photosensitivity 01/13/2017  . Sjogren's syndrome with keratoconjunctivitis sicca ([place]) 01/13/2017  . Recurrent oral ulcers and nasal ulcers  01/13/2017  . Raynaud's disease without gangrene 01/13/2017  . Primary osteoarthritis of both hands 01/13/2017  . Primary osteoarthritis of both knees 01/13/2017  . Primary osteoarthritis of both  feet 01/13/2017  . Osteopenia of multiple sites 01/13/2017  . History of hypertension 01/13/2017  . History of hypothyroidism 01/13/2017  . History of gastroesophageal reflux (GERD)/ PUD 01/13/2017  . History of MRSA infection 01/13/2017  . Diarrhea  05/28/2013  . Family history of malignant neoplasm of gastrointestinal tract 06/22/2011  . ADENOCARCINOMA, BREAST 05/02/2009  . CORONARY ARTERY DISEASE 05/02/2009  . DYSPHAGIA UNSPECIFIED 05/02/2009    Past Medical History:  Diagnosis Date  . Anxiety   . Arthritis   . Atrial fibrillation ([place])   . Autoimmune disorder ([place])    SJORGREN'S  . Breast cancer ([place])    right breast  . Cardiac arrhythmia due to congenital heart disease   . Chronic headaches   . Depression   . GERD (gastroesophageal reflux disease)   . Hypertension   . Myocardial infarction ([place])    1990  . Osteoporosis   . Status post dilation of esophageal narrowing   . Thyroid disease   . Ulcer     Family History  Problem Relation Age of Onset  . Breast cancer Mother   . Lung cancer Mother   . Cancer Father        larynx  . Heart failure Father   . Heart disease Brother   . Colon cancer Brother   . Pseudochol deficiency Other   . Breast cancer Sister   . Cancer Sister        thyroid   . Breast cancer Maternal Grandmother   . Autoimmune disease Daughter   . Cancer Son        prostate   . Rheum arthritis Grandchild   . Esophageal cancer Neg Hx   . Rectal cancer Neg Hx    Past Surgical History:  Procedure Laterality Date  . APPENDECTOMY    . breast reconstuction     x 2  . CHOLECYSTECTOMY    . COLONOSCOPY    . double mastectomy    . PARTIAL HYSTERECTOMY  1990  . TIBIA FRACTURE SURGERY Right    with subsequent hardware removal  . UPPER GASTROINTESTINAL ENDOSCOPY    . WRIST FRACTURE SURGERY Left    Social History   Social History Narrative   1 cup of coffee daily     Objective: Vital Signs: BP 122/76 (BP Location: Left Arm, Patient Position: Sitting, Cuff Size: Normal)   Pulse (!) 59   Resp 16   Ht 5\' 6"  (1.676 m)   Wt 148 lb (67.1 kg)   BMI 23.89 kg/m    Physical Exam  Constitutional: She is oriented to person, place, and time. She appears well-developed and well-nourished.  HENT:   Head: Normocephalic and atraumatic.  No parotid swelling.  Eyes: Conjunctivae and EOM are normal.  Neck: Normal range of motion.  Cardiovascular: Normal rate, regular rhythm, normal heart sounds and intact distal pulses.  Pulmonary/Chest: Effort normal and breath sounds normal.  Abdominal: Soft. Bowel sounds are normal.  Lymphadenopathy:    She has no cervical adenopathy.  Neurological: She is alert and oriented to person, place, and time.  Skin: Skin is warm and dry. Capillary refill takes less than 2 seconds.  No malar rash noted.  No digital ulcerations or signs of gangrene.  Psychiatric: She has a normal mood and affect. Her behavior is normal.  Nursing note and vitals reviewed.    Musculoskeletal Exam: C-spine limited range of motion.  Thoracic and lumbar spine good range of motion.  Shoulder joints, elbow joints, wrist joints, MCPs,  PIPs, DIPs good range of motion.  She synovial thickening of her right third MCP joint.  She has synovitis of her left second PIP joint.  She has PIP and DIP synovial thickening consistent with osteoarthritis.  She has synovial thickening of bilateral CMC joints.  Hip joints, knee joints, ankle joints, MTPs, PIPs, DIPs good range of motion.  No warmth or effusion of bilateral knees.  Bilateral knee crepitus.  CDAI Exam: CDAI Homunculus Exam:   Tenderness:  Right hand: 3rd MCP Left hand: 2nd PIP and 3rd DIP  Swelling:  Left hand: 2nd PIP  Joint Counts:  CDAI Tender Joint count: 2 CDAI Swollen Joint count: 1     Investigation: No additional findings. Labs: 06/28/2017 PLQ eye exam: 03/30/2017 Imaging: No results found.  Speciality Comments: No specialty comments available.    Procedures:  No procedures performed Allergies: Robinul [glycopyrrolate]; Sulfamethoxazole-trimethoprim; and Codeine   Assessment / Plan:     Visit Diagnoses: Autoimmune disease ([place]) +ANA +Ro +La +RF oral ulcers nasal ulcers Raynaud photosensitivity, Sicca: She  has not had any recent flares.  She continues to have oral and nasal ulcerations.  She uses biotene mouthwash and Restasis and Refresh eye drops for management of sicca symptoms.  She has very mild symptoms of Raynaud's.  No digital ulcerations noted.  She has no malar rash on exam. She has recently had 2 tick bites, but no target lesion was noted.  She was advised to follow up with PCP if she develops a rash.  She has not had any worsening fatigue or fevers.  No cervical lymphadenopathy on exam.  She has synovitis of her left 2nd PIP joint and synovial thickening of her 3rd MCP joint.  We discussed increasing her dose of PLQ but she would not like to at this time.  We also discussed adding MTX, but she was previously on MTX for treatment of breast cancer and she would not like to be restarted on it.  She will continue taking Plaquenil 200 mg 1 tablet daily.  She will have her autoimmune labs checked by her PCP in June.  High risk medication use - PLQ. eye exam: 03/30/2017.  CBC and CMP will be checked in June.  Sjogren's syndrome with keratoconjunctivitis sicca ([place]): She continues to have sicca symptoms.  She has Biotene products as well as Restasis and refresh eyedrops on a daily basis. She has no parotid swelling.  She follows up with her dentist on a regular basis.  She has had an increased frequency of dental caries recently.   Raynaud's disease without gangrene: Symptoms are mild.  No signs of gangrene or digital ulcerations.   Primary osteoarthritis of both hands: She has PIP and DIP synovial thickening consistent with osteoarthritis.  Joint protection muscle strengthening were discussed.  Primary osteoarthritis of both knees: No warmth or effusion.  She has no discomfort in her knees at this time.  She has bilateral knee crepitus.  Primary osteoarthritis of both feet: She has osteoarthritic changes in bilateral feet.  She has been exercising and having increased discomfort in her bilateral feet.   We discussed the importance of wearing proper fitted shoes.  Other medical conditions are listed as follows:  Osteopenia of multiple sites - on Evista  History of gastroesophageal reflux (GERD)/ PUD  History of MRSA infection  History of hypothyroidism  History of hypertension    Orders: No orders of the defined types were placed in this encounter.  No orders of the defined types were  placed in this encounter.   Face-to-face time spent with patient was 30 minutes. >50% of time was spent in counseling and coordination of care.  Follow-Up Instructions: Return for Autoimmune Disease, Sjogren's syndrome, Osteoarthritis.   Ofilia Neas, PA-C   I examined and evaluated the patient with Hazel Sams PA.  She had mild synovitis on examination.  She does not want to increase the dose of Plaquenil.  We also discussed possible use of methotrexate but she declined.  We will continue to monitor her labs closely.  The plan of care was discussed as noted above.  Bo Merino, MD  Note - This record has been created using [employer].  Chart creation errors have been sought, but may not always  have been located. Such creation errors do not reflect on  the standard of medical care.

## 2017-11-22 ENCOUNTER — Ambulatory Visit: Payer: PPO | Admitting: Rheumatology

## 2017-11-22 ENCOUNTER — Encounter: Payer: Self-pay | Admitting: Rheumatology

## 2017-11-22 VITALS — BP 122/76 | HR 59 | Resp 16 | Ht 66.0 in | Wt 148.0 lb

## 2017-11-22 DIAGNOSIS — M8589 Other specified disorders of bone density and structure, multiple sites: Secondary | ICD-10-CM

## 2017-11-22 DIAGNOSIS — M19042 Primary osteoarthritis, left hand: Secondary | ICD-10-CM

## 2017-11-22 DIAGNOSIS — M3501 Sicca syndrome with keratoconjunctivitis: Secondary | ICD-10-CM | POA: Diagnosis not present

## 2017-11-22 DIAGNOSIS — M359 Systemic involvement of connective tissue, unspecified: Secondary | ICD-10-CM

## 2017-11-22 DIAGNOSIS — Z8679 Personal history of other diseases of the circulatory system: Secondary | ICD-10-CM | POA: Diagnosis not present

## 2017-11-22 DIAGNOSIS — Z8639 Personal history of other endocrine, nutritional and metabolic disease: Secondary | ICD-10-CM

## 2017-11-22 DIAGNOSIS — M19041 Primary osteoarthritis, right hand: Secondary | ICD-10-CM

## 2017-11-22 DIAGNOSIS — M19071 Primary osteoarthritis, right ankle and foot: Secondary | ICD-10-CM | POA: Diagnosis not present

## 2017-11-22 DIAGNOSIS — M19072 Primary osteoarthritis, left ankle and foot: Secondary | ICD-10-CM

## 2017-11-22 DIAGNOSIS — D8989 Other specified disorders involving the immune mechanism, not elsewhere classified: Secondary | ICD-10-CM

## 2017-11-22 DIAGNOSIS — I7300 Raynaud's syndrome without gangrene: Secondary | ICD-10-CM | POA: Diagnosis not present

## 2017-11-22 DIAGNOSIS — Z79899 Other long term (current) drug therapy: Secondary | ICD-10-CM | POA: Diagnosis not present

## 2017-11-22 DIAGNOSIS — M170 Bilateral primary osteoarthritis of knee: Secondary | ICD-10-CM

## 2017-11-22 DIAGNOSIS — Z8719 Personal history of other diseases of the digestive system: Secondary | ICD-10-CM | POA: Diagnosis not present

## 2017-11-22 DIAGNOSIS — Z8614 Personal history of Methicillin resistant Staphylococcus aureus infection: Secondary | ICD-10-CM

## 2017-12-07 ENCOUNTER — Telehealth: Payer: Self-pay | Admitting: Rheumatology

## 2017-12-07 NOTE — Telephone Encounter (Signed)
Patient called stating she is having pain in her right leg which hurts when she walks and causing her to limp.  Patient states she plans to get her bloodwork done with the PCP in June.  Patient states at her last appointment they discussed increasing her Plaquenil and wants to know if that will help with her leg pain.

## 2017-12-09 NOTE — Telephone Encounter (Signed)
Patient has been scheduled for an appointment 12/13/17 at 10:20 am.

## 2017-12-12 NOTE — Progress Notes (Signed)
Office Visit Note  Patient: Alice Medina             Date of Birth: 18-Jun-1941           MRN: [id number]             PCP: Shon Baton, MD Referring: Shon Baton, MD Visit Date: 12/13/2017 Occupation: @GUAROCC @    Subjective:  Fatigue and fever   History of Present Illness: Alice Medina is a 77 y.o. female with history of autoimmune disease, Sjogren's, Raynaud's, and osteoarthritis. She is on Plaquenil 200 mg 1 tablet by mouth daily.  The week of May 15 she increase her dose to Plaquenil 2 tablets every other day but did not notice any difference.  Patient states that she has had 3 tick bites in the past 2 to 3 months.  She states the most recent tick bite was on May 4.  She states starting on May 9 she developed generalized muscle aches and joint pain.  She states that she is having bilateral CMC joint pain as well as right trochanteric bursitis and right SI joint pain.  She denies any joint swelling.  She denies any recent sick contacts.  She states that since Saturday she has been running a fever.  She states this morning she woke up and was profusely sweating and took her temperature and is 104.  She states she is also developed a cough as well as diarrhea.  She denies any recent rashes.  She denies any target lesion initially after removing the tick.  She states that she is having significant muscle weakness and fell this morning in the bathtub.  She reports that her next visit with her PCP is June 14.  She has not contacted her PCP office to see if she can get in as an acute visit.   Activities of Daily Living:  Patient reports morning stiffness for 10 minutes.   Patient Denies nocturnal pain.  Difficulty dressing/grooming: Denies Difficulty climbing stairs: Reports Difficulty getting out of chair: Reports Difficulty using hands for taps, buttons, cutlery, and/or writing: Reports   Review of Systems  Constitutional: Positive for fatigue.  HENT: Positive for mouth dryness.  Negative for mouth sores and nose dryness.   Eyes: Positive for redness and dryness. Negative for pain and visual disturbance.  Respiratory: Positive for cough. Negative for hemoptysis, shortness of breath and difficulty breathing.   Cardiovascular: Positive for swelling in legs/feet. Negative for chest pain, palpitations, hypertension and irregular heartbeat.  Gastrointestinal: Positive for diarrhea. Negative for blood in stool and constipation.  Endocrine: Negative for increased urination.  Genitourinary: Negative for difficulty urinating and painful urination.  Musculoskeletal: Positive for arthralgias, joint pain, joint swelling, muscle weakness, morning stiffness and muscle tenderness. Negative for myalgias and myalgias.  Skin: Negative for color change, pallor, rash, hair loss, nodules/bumps, skin tightness, ulcers and sensitivity to sunlight.  Allergic/Immunologic: Negative for susceptible to infections.  Neurological: Negative for light-headedness and headaches.  Hematological: Negative for swollen glands.  Psychiatric/Behavioral: Positive for sleep disturbance. Negative for depressed mood. The patient is not nervous/anxious.     PMFS History:  Patient Active Problem List   Diagnosis Date Noted  . Multiple closed fractures of metacarpal bone 07/25/2017  . Autoimmune disease ([place]) +ANA +Ro +La +RF oral ulcers nasal ulcers Raynaud photosensitivity SICCA  01/13/2017  . High risk medication use 01/13/2017  . Photosensitivity 01/13/2017  . Sjogren's syndrome with keratoconjunctivitis sicca ([place]) 01/13/2017  . Recurrent oral ulcers and nasal ulcers  01/13/2017  . Raynaud's disease without gangrene 01/13/2017  . Primary osteoarthritis of both hands 01/13/2017  . Primary osteoarthritis of both knees 01/13/2017  . Primary osteoarthritis of both feet 01/13/2017  . Osteopenia of multiple sites 01/13/2017  . History of hypertension 01/13/2017  . History of hypothyroidism 01/13/2017  .  History of gastroesophageal reflux (GERD)/ PUD 01/13/2017  . History of MRSA infection 01/13/2017  . Diarrhea 05/28/2013  . Family history of malignant neoplasm of gastrointestinal tract 06/22/2011  . ADENOCARCINOMA, BREAST 05/02/2009  . CORONARY ARTERY DISEASE 05/02/2009  . DYSPHAGIA UNSPECIFIED 05/02/2009    Past Medical History:  Diagnosis Date  . Anxiety   . Arthritis   . Atrial fibrillation ([place])   . Autoimmune disorder ([place])    SJORGREN'S  . Breast cancer ([place])    right breast  . Cardiac arrhythmia due to congenital heart disease   . Chronic headaches   . Depression   . GERD (gastroesophageal reflux disease)   . Hypertension   . Myocardial infarction ([place])    1990  . Osteoporosis   . Status post dilation of esophageal narrowing   . Thyroid disease   . Ulcer     Family History  Problem Relation Age of Onset  . Breast cancer Mother   . Lung cancer Mother   . Cancer Father        larynx  . Heart failure Father   . Heart disease Brother   . Colon cancer Brother   . Pseudochol deficiency Other   . Breast cancer Sister   . Cancer Sister        thyroid   . Breast cancer Maternal Grandmother   . Autoimmune disease Daughter   . Cancer Son        prostate   . Rheum arthritis Grandchild   . Esophageal cancer Neg Hx   . Rectal cancer Neg Hx    Past Surgical History:  Procedure Laterality Date  . APPENDECTOMY    . breast reconstuction     x 2  . CHOLECYSTECTOMY    . COLONOSCOPY    . double mastectomy    . PARTIAL HYSTERECTOMY  1990  . TIBIA FRACTURE SURGERY Right    with subsequent hardware removal  . UPPER GASTROINTESTINAL ENDOSCOPY    . WRIST FRACTURE SURGERY Left    Social History   Social History Narrative   1 cup of coffee daily     Objective: Vital Signs: BP (!) 85/58 (BP Location: Left Arm, Patient Position: Sitting, Cuff Size: Normal)   Pulse 79   Temp 97.8 F (36.6 C)   Resp 18   Ht 5\' 6"  (1.676 m)   Wt 146 lb (66.2 kg)   BMI 23.57  kg/m    Physical Exam  Constitutional: She is oriented to person, place, and time. She appears well-developed and well-nourished.  HENT:  Head: Normocephalic and atraumatic.  Eyes: Conjunctivae and EOM are normal.  Neck: Normal range of motion.  Cardiovascular: Normal rate, regular rhythm, normal heart sounds and intact distal pulses.  Pulmonary/Chest: Effort normal and breath sounds normal.  Abdominal: Soft. Bowel sounds are normal.  Lymphadenopathy:    She has no cervical adenopathy.  Neurological: She is alert and oriented to person, place, and time.  Skin: Skin is warm. Capillary refill takes less than 2 seconds. There is pallor.  Psychiatric: She has a normal mood and affect. Her behavior is normal.  Nursing note and vitals reviewed.    Musculoskeletal Exam: C-spine  limited ROM with lateral rotation.  No nuchal ridgity.  Thoracic kyphosis noted.  Shoulder joints, elbow joints, wrist joints, MCPs, PIPs, and DIPs good ROM.  She has synovial thickening of right third MCP and synovitis of left second PIP joint.  She has PIP and DIP synovial thickening consistent with osteoarthritis. CMC joint synovial thickening.  Hip joints, knee joints, ankle joints, MTPs, PIPs, and DIPs good ROM with no synovitis.  No warmth or effusion of knee joints. Bilateral knee crepitus.  She has tenderness of the right trochanteric bursa and right SI joint.  She has tenderness along the right IT band.   CDAI Exam: No CDAI exam completed.    Investigation: No additional findings. CBC Latest Ref Rng & Units 08/18/2016 03/28/2016  WBC 3.8 - 10.8 K/uL 3.5(L) 6.7  Hemoglobin 11.7 - 15.5 g/dL 12.7 13.5  Hematocrit 35.0 - 45.0 % 38.7 39.5  Platelets 140 - 400 K/uL 189 164   CMP Latest Ref Rng & Units 08/18/2016 03/28/2016  Glucose 65 - 99 mg/dL 81 85  BUN 7 - 25 mg/dL 19 12  Creatinine 0.60 - 0.93 mg/dL 0.87 0.92  Sodium 135 - 146 mmol/L 133(L) 130(L)  Potassium 3.5 - 5.3 mmol/L 3.7 3.7  Chloride 98 - 110 mmol/L  97(L) 94(L)  CO2 20 - 31 mmol/L 27 29  Calcium 8.6 - 10.4 mg/dL 10.0 9.6  Total Protein 6.1 - 8.1 g/dL 7.0 7.3  Total Bilirubin 0.2 - 1.2 mg/dL 0.4 0.5  Alkaline Phos 33 - 130 U/L 51 53  AST 10 - 35 U/L 31 38  ALT 6 - 29 U/L 13 18     Imaging: No results found.  Speciality Comments: No specialty comments available.    Procedures:  No procedures performed Allergies: Robinul [glycopyrrolate]; Sulfamethoxazole-trimethoprim; and Codeine   Assessment / Plan:     Visit Diagnoses: Autoimmune disease ([place]) +ANA +Ro +La +RF oral ulcers nasal ulcers Raynaud photosensitivity SICCA: She has had increased generalized myalgias,  arthralgias, and fatigue since Dec 01, 2017.  Per patient she has had a low grade fever starting Saturday and woke up this morning with a fever of 104F.  She also is experiencing diarrhea and a intermittently productive cough.  She reports 3 tick bites in the past 2 months, last tick bite was 11/26/17.  She has not followed up with PCP.  She has no recent rashes.   Her autoimmune disease has been clinically stable. No malar rash noted.  No oral or nasal ulcerations noted.  She continues to have sicca symptoms as well as symptoms of Raynaud's.  No digital ulcerations were noted.  She will continue taking Plaquenil 200 mg by mouth 1 tablet daily.  Her autoimmune labs will be checked with her next lab work with her PCP.  She is going to be seen in the emergency department today for further evaluation of her fever. She was advised to follow up with PCP following her evaluation at the ED.  High risk medication use - PLQ 200 mg 1 tablet by mouth daily.  We did not obtain lab work today due to her going to be seen in the ED today.    Sjogren's syndrome with keratoconjunctivitis sicca ([place]) - She continues to have sicca symptoms.  She has no parotid swelling on exam. She uses biotene products for dry mouth.  She also uses Restasis and refresh eye drops for eye dryness.   Raynaud's  disease without gangrene:  Her symptoms of Raynaud's are very mild.  She has no digital ulceration or signs of gangrene on exam.    Primary osteoarthritis of both hands: She has PIP and DIP synovial thickening consistent with osteoarthritis of bilateral hands.  She has bilateral CMC joint synovial thickening and tenderness.  Joint protection muscle strengthening were discussed.  Primary osteoarthritis of both knees: No warmth or effusion of knee joints.  Bilateral knee crepitus.  She has no discomfort in her knees at this time.  Primary osteoarthritis of both feet: PIP and DIP synovial thickening consistent with zoster arthritis of bilateral feet.  She was proper fitting shoes.  Pain of right sacroiliac joint: She continues have tenderness in the right SI joint.  She has tenderness  Fever, unspecified fever cause: Patient has been running a low-grade fever since Saturday.  Per patient she woke up today and was profusely sweating and had a fever of 104.  She is been having increased generalized muscle aches and arthralgias since May 9. She denied any sick contacts. Patient reports that she has had 3 tick bites in the past 2 months.  Her most recent tick bite was on May 4 and her symptoms began May 9.  She has been having worsening generalized muscle weakness as well. She did not develop a rash and has no evidence of a target lesion.  Today she developed diarrhea but no abdominal pain. Her blood pressure today in the office was 85/58. She is also had a productive cough for the past 2 weeks.  Her lungs were clear to auscultation today.  According to the patient, she had a fall this morning in the bathtub due to generalized weakness and lightheadedness. She denied losing consciousness. She has not seen her PCP and does not have an appointment until June 14.  She is going to be evaluated in the ED today, and she was advised to follow up with PCP following the visit in the ED.   Osteopenia of multiple sites:  She is on Evista.   Other medical conditions are listed as follows:   History of gastroesophageal reflux (GERD)/ PUD  History of MRSA infection  History of hypothyroidism  History of hypertension   Orders: No orders of the defined types were placed in this encounter.  No orders of the defined types were placed in this encounter.   Face-to-face time spent with patient was 30 minutes.> 50% of time was spent in counseling and coordination of care.  Follow-Up Instructions: Return for Autoimmune Disease, Sjogren's syndrome, Osteoarthritis.   Ofilia Neas, PA-C  I examined and evaluated the patient with Hazel Sams PA. The plan of care was discussed as noted above.  Bo Merino, MD  Note - This record has been created using Editor, commissioning.  Chart creation errors have been sought, but may not always  have been located. Such creation errors do not reflect on  the standard of medical care.

## 2017-12-13 ENCOUNTER — Other Ambulatory Visit: Payer: Self-pay

## 2017-12-13 ENCOUNTER — Emergency Department (HOSPITAL_COMMUNITY)
Admission: EM | Admit: 2017-12-13 | Discharge: 2017-12-13 | Disposition: A | Discharge status: Home / Self Care | Payer: PPO | Attending: Emergency Medicine | Admitting: Emergency Medicine

## 2017-12-13 ENCOUNTER — Ambulatory Visit: Payer: PPO | Admitting: Physician Assistant

## 2017-12-13 ENCOUNTER — Emergency Department (HOSPITAL_COMMUNITY): Payer: PPO

## 2017-12-13 ENCOUNTER — Encounter: Payer: Self-pay | Admitting: Physician Assistant

## 2017-12-13 ENCOUNTER — Other Ambulatory Visit: Payer: Self-pay | Admitting: Rheumatology

## 2017-12-13 ENCOUNTER — Encounter (HOSPITAL_COMMUNITY): Payer: Self-pay

## 2017-12-13 VITALS — BP 85/58 | HR 79 | Temp 97.8°F | Resp 18 | Ht 66.0 in | Wt 146.0 lb

## 2017-12-13 DIAGNOSIS — M170 Bilateral primary osteoarthritis of knee: Secondary | ICD-10-CM | POA: Diagnosis not present

## 2017-12-13 DIAGNOSIS — M19042 Primary osteoarthritis, left hand: Secondary | ICD-10-CM

## 2017-12-13 DIAGNOSIS — M533 Sacrococcygeal disorders, not elsewhere classified: Secondary | ICD-10-CM

## 2017-12-13 DIAGNOSIS — M3501 Sicca syndrome with keratoconjunctivitis: Secondary | ICD-10-CM | POA: Diagnosis not present

## 2017-12-13 DIAGNOSIS — Z8719 Personal history of other diseases of the digestive system: Secondary | ICD-10-CM

## 2017-12-13 DIAGNOSIS — Z8614 Personal history of Methicillin resistant Staphylococcus aureus infection: Secondary | ICD-10-CM

## 2017-12-13 DIAGNOSIS — Z79899 Other long term (current) drug therapy: Secondary | ICD-10-CM | POA: Insufficient documentation

## 2017-12-13 DIAGNOSIS — D8989 Other specified disorders involving the immune mechanism, not elsewhere classified: Secondary | ICD-10-CM | POA: Diagnosis not present

## 2017-12-13 DIAGNOSIS — M19041 Primary osteoarthritis, right hand: Secondary | ICD-10-CM

## 2017-12-13 DIAGNOSIS — Z8639 Personal history of other endocrine, nutritional and metabolic disease: Secondary | ICD-10-CM

## 2017-12-13 DIAGNOSIS — Z7982 Long term (current) use of aspirin: Secondary | ICD-10-CM | POA: Insufficient documentation

## 2017-12-13 DIAGNOSIS — E039 Hypothyroidism, unspecified: Secondary | ICD-10-CM | POA: Diagnosis not present

## 2017-12-13 DIAGNOSIS — M8589 Other specified disorders of bone density and structure, multiple sites: Secondary | ICD-10-CM

## 2017-12-13 DIAGNOSIS — I2510 Atherosclerotic heart disease of native coronary artery without angina pectoris: Secondary | ICD-10-CM | POA: Insufficient documentation

## 2017-12-13 DIAGNOSIS — M19072 Primary osteoarthritis, left ankle and foot: Secondary | ICD-10-CM

## 2017-12-13 DIAGNOSIS — R0602 Shortness of breath: Secondary | ICD-10-CM | POA: Diagnosis not present

## 2017-12-13 DIAGNOSIS — R05 Cough: Secondary | ICD-10-CM | POA: Diagnosis not present

## 2017-12-13 DIAGNOSIS — M19071 Primary osteoarthritis, right ankle and foot: Secondary | ICD-10-CM | POA: Diagnosis not present

## 2017-12-13 DIAGNOSIS — R531 Weakness: Secondary | ICD-10-CM | POA: Diagnosis not present

## 2017-12-13 DIAGNOSIS — R509 Fever, unspecified: Secondary | ICD-10-CM | POA: Diagnosis not present

## 2017-12-13 DIAGNOSIS — Z8679 Personal history of other diseases of the circulatory system: Secondary | ICD-10-CM

## 2017-12-13 DIAGNOSIS — I10 Essential (primary) hypertension: Secondary | ICD-10-CM | POA: Insufficient documentation

## 2017-12-13 DIAGNOSIS — I7300 Raynaud's syndrome without gangrene: Secondary | ICD-10-CM | POA: Diagnosis not present

## 2017-12-13 DIAGNOSIS — M359 Systemic involvement of connective tissue, unspecified: Secondary | ICD-10-CM

## 2017-12-13 LAB — URINALYSIS, ROUTINE W REFLEX MICROSCOPIC
BILIRUBIN URINE: NEGATIVE
Bacteria, UA: NONE SEEN
GLUCOSE, UA: NEGATIVE mg/dL
HGB URINE DIPSTICK: NEGATIVE
KETONES UR: NEGATIVE mg/dL
LEUKOCYTES UA: NEGATIVE
Nitrite: NEGATIVE
PH: 6 (ref 5.0–8.0)
PROTEIN: NEGATIVE mg/dL
Specific Gravity, Urine: 1.006 (ref 1.005–1.030)

## 2017-12-13 LAB — CBC WITH DIFFERENTIAL/PLATELET
Basophils Absolute: 0 10*3/uL (ref 0.0–0.1)
Basophils Relative: 0 %
Eosinophils Absolute: 0 10*3/uL (ref 0.0–0.7)
Eosinophils Relative: 0 %
HEMATOCRIT: 42.5 % (ref 36.0–46.0)
HEMOGLOBIN: 14.6 g/dL (ref 12.0–15.0)
LYMPHS ABS: 0.7 10*3/uL (ref 0.7–4.0)
Lymphocytes Relative: 24 %
MCH: 30.8 pg (ref 26.0–34.0)
MCHC: 34.4 g/dL (ref 30.0–36.0)
MCV: 89.7 fL (ref 78.0–100.0)
MONOS PCT: 11 %
Monocytes Absolute: 0.3 10*3/uL (ref 0.1–1.0)
Neutro Abs: 1.9 10*3/uL (ref 1.7–7.7)
Neutrophils Relative %: 65 %
Platelets: 133 10*3/uL — ABNORMAL LOW (ref 150–400)
RBC: 4.74 MIL/uL (ref 3.87–5.11)
RDW: 13.1 % (ref 11.5–15.5)
WBC: 3 10*3/uL — ABNORMAL LOW (ref 4.0–10.5)

## 2017-12-13 LAB — COMPREHENSIVE METABOLIC PANEL
ALK PHOS: 79 U/L (ref 38–126)
ALT: 22 U/L (ref 14–54)
AST: 52 U/L — ABNORMAL HIGH (ref 15–41)
Albumin: 3.6 g/dL (ref 3.5–5.0)
Anion gap: 10 (ref 5–15)
BILIRUBIN TOTAL: 0.8 mg/dL (ref 0.3–1.2)
BUN: 18 mg/dL (ref 6–20)
CHLORIDE: 94 mmol/L — AB (ref 101–111)
CO2: 22 mmol/L (ref 22–32)
CREATININE: 1.16 mg/dL — AB (ref 0.44–1.00)
Calcium: 8.9 mg/dL (ref 8.9–10.3)
GFR calc Af Amer: 51 mL/min — ABNORMAL LOW (ref >60)
GFR calc non Af Amer: 44 mL/min — ABNORMAL LOW (ref >60)
Glucose, Bld: 97 mg/dL (ref 65–99)
Potassium: 4.7 mmol/L (ref 3.5–5.1)
Sodium: 126 mmol/L — ABNORMAL LOW (ref 135–145)
Total Protein: 8.3 g/dL — ABNORMAL HIGH (ref 6.5–8.1)

## 2017-12-13 LAB — CBG MONITORING, ED: Glucose-Capillary: 80 mg/dL (ref 65–99)

## 2017-12-13 LAB — TROPONIN I: Troponin I: <0.03 ng/mL (ref <0.03)

## 2017-12-13 LAB — I-STAT CG4 LACTIC ACID, ED: Lactic Acid, Venous: 1.09 mmol/L (ref 0.5–1.9)

## 2017-12-13 MED ORDER — DOXYCYCLINE HYCLATE 100 MG PO CAPS: 100.0000 mg | ORAL_CAPSULE | Freq: Two times a day (BID) | ORAL | 0 refills | Status: DC

## 2017-12-13 MED ORDER — DOXYCYCLINE HYCLATE 100 MG PO TABS
100.0000 mg | ORAL_TABLET | Freq: Once | ORAL | Status: AC
  Administered 2017-12-13: 100 mg via ORAL
  Filled 2017-12-13: qty 1

## 2017-12-13 MED ORDER — SODIUM CHLORIDE 0.9 % IV BOLUS (SEPSIS)
1000.0000 mL | Freq: Once | INTRAVENOUS | Status: AC
  Administered 2017-12-13: 1000 mL via INTRAVENOUS

## 2017-12-13 MED ORDER — SODIUM CHLORIDE 0.9 % IV BOLUS (SEPSIS)
250.0000 mL | Freq: Once | INTRAVENOUS | Status: AC
  Administered 2017-12-13: 250 mL via INTRAVENOUS

## 2017-12-13 NOTE — ED Provider Notes (Signed)
17: 00-checkup from Dr. Tomi Bamberger to evaluate after return of urinalysis, and considered disposition with antibiotics for UTI versus tick fever.  Clinical Course as of Dec 14 1827  Tue Dec 13, 2017  1815 Normal  Urinalysis, Routine w reflex microscopic [EW]  1816 Normal  I-Stat CG4 Lactic Acid, ED  (not at  [facility]) [EW]  1816 normal  Troponin I [EW]  1816 Normal except sodium low, chloride low, creatinine high, total protein high, AST high  Comprehensive metabolic panel(!) [EW]  7680 No infiltrate, images reviewed  DG Chest 2 View [EW]    Clinical Course User Index [EW] Daleen Bo, MD     Patient Vitals for the past 24 hrs:  BP Temp Temp src Pulse Resp SpO2  12/13/17 1807 (!) 131/109 - - 72 17 100 %  12/13/17 1535 132/71 - - 77 15 100 %  12/13/17 1500 132/71 - - - 19 -  12/13/17 1400 108/69 - - - 15 -  12/13/17 1206 93/62 98.3 F (36.8 C) Oral 69 16 95 %    6:18 PM Reevaluation with update and discussion. After initial assessment and treatment, an updated evaluation reveals she is comfortable now and does not feel like she has a fever.  Findings discussed with patient and her daughter, all questions were answered. Daleen Bo   Medical Decision Making: Patient presenting with history of tick bites, and fever.  The tick bites were several weeks ago.  She also has general weakness, with malaise.  Evaluation in the ED, negative for definite signs of infection.  Therefore patient is at risk for having tick fever, and will need to be covered with doxycycline.  Patient lives, and spends her time, in [place] therefore is at low risk for Lyme disease.  CRITICAL CARE-no Performed by: Daleen Bo   Nursing Notes Reviewed/ Care Coordinated Applicable Imaging Reviewed Interpretation of Laboratory Data incorporated into ED treatment  The patient appears reasonably screened and/or stabilized for discharge and I doubt any other medical condition or other [facility] requiring further  screening, evaluation, or treatment in the ED at this time prior to discharge.  Plan: Home Medications-continue usual medications, APAP for fever.  Prescription for doxycycline, first dose in ED; Home Treatments-rest, fluids; return here if the recommended treatment, does not improve the symptoms; Recommended follow up-PCP checkup 1 week and as needed.     Daleen Bo, MD 12/13/17 [phone]

## 2017-12-13 NOTE — ED Triage Notes (Signed)
Pt arrives today c/o increasing muscles aches, pain in hip/leg, headaches that started 5/9. Pt has a hx of autoimmune disease. Pt's daughter reports 3 tic bites last April/early May, some concern over rocky mtn spotted fever. Starting Saturday, pt started to experience minimally productive cough, fever, weakness, shortness of breath, intermittent left shoulder/back pain radiating into front. Today pt was seen by rheumatologist and has spoken with PCP who informed pt to be evaluated in ED for hypotension and  fever. Pt reports this morning at around 6am highest reading for 105. 2 extra strength tylenol taken then.

## 2017-12-13 NOTE — ED Notes (Signed)
Pt attempted to use restroom and was not able to provide UA sample

## 2017-12-13 NOTE — ED Provider Notes (Signed)
[facility] DEPT Provider Note   CSN: [id number] Arrival date & time: 12/13/17  1145     History   Chief Complaint Chief Complaint  Patient presents with  . Weakness  . Fever  . Cough    HPI Alice Medina is a 77 y.o. female.  HPI Pt has been having body aches since May 9th. She has been fatigued and tired.  In the last few days she started to have a low grade fever.  Today she had a fever up to 105.  She had an appointment with her rheumatologist.  She was told to call her primary care doctor.  The nurse said she should come to the ED for evaluation.   Pt's BP was low today.  Normally it is around 150/90.  No vomiting.   Pt had some diarrhea today,a  Couple of episodes.  No blood in her stool.  She has had a productive cough, initially clear mucus now more opaque.  She has had three tick bites since March.   Past Medical History:  Diagnosis Date  . Anxiety   . Arthritis   . Atrial fibrillation ([place])   . Autoimmune disorder ([place])    SJORGREN'S  . Breast cancer ([place])    right breast  . Cardiac arrhythmia due to congenital heart disease   . Chronic headaches   . Depression   . GERD (gastroesophageal reflux disease)   . Hypertension   . Myocardial infarction ([place])    1990  . Osteoporosis   . Status post dilation of esophageal narrowing   . Thyroid disease   . Ulcer     Patient Active Problem List   Diagnosis Date Noted  . Multiple closed fractures of metacarpal bone 07/25/2017  . Autoimmune disease ([place]) +ANA +Ro +[place] +RF oral ulcers nasal ulcers Raynaud photosensitivity SICCA  01/13/2017  . High risk medication use 01/13/2017  . Photosensitivity 01/13/2017  . Sjogren's syndrome with keratoconjunctivitis sicca ([place]) 01/13/2017  . Recurrent oral ulcers and nasal ulcers  01/13/2017  . Raynaud's disease without gangrene 01/13/2017  . Primary osteoarthritis of both hands 01/13/2017  . Primary osteoarthritis of both knees 01/13/2017  .  Primary osteoarthritis of both feet 01/13/2017  . Osteopenia of multiple sites 01/13/2017  . History of hypertension 01/13/2017  . History of hypothyroidism 01/13/2017  . History of gastroesophageal reflux (GERD)/ PUD 01/13/2017  . History of MRSA infection 01/13/2017  . Diarrhea 05/28/2013  . Family history of malignant neoplasm of gastrointestinal tract 06/22/2011  . ADENOCARCINOMA, BREAST 05/02/2009  . CORONARY ARTERY DISEASE 05/02/2009  . DYSPHAGIA UNSPECIFIED 05/02/2009    Past Surgical History:  Procedure Laterality Date  . APPENDECTOMY    . breast reconstuction     x 2  . CHOLECYSTECTOMY    . COLONOSCOPY    . double mastectomy    . PARTIAL HYSTERECTOMY  1990  . TIBIA FRACTURE SURGERY Right    with subsequent hardware removal  . UPPER GASTROINTESTINAL ENDOSCOPY    . WRIST FRACTURE SURGERY Left      OB History   None      Home Medications    Prior to Admission medications   Medication Sig Start Date End Date Taking? Authorizing Provider  amLODipine (NORVASC) 2.5 MG tablet Take 5 mg by mouth daily.  06/14/17  Yes [provider]  aspirin 325 MG tablet Take 325 mg by mouth daily.   Yes [provider]  cholecalciferol (VITAMIN D) 1000 UNITS tablet Take 1,000  Units by mouth daily.    Yes [provider]  cyclobenzaprine (FLEXERIL) 10 MG tablet Take 10 mg by mouth at bedtime as needed for muscle spasms.    Yes [provider]  cycloSPORINE (RESTASIS) 0.05 % ophthalmic emulsion Place 1 drop into both eyes 2 (two) times daily.    Yes [provider]  DULoxetine (CYMBALTA) 60 MG capsule Take 60 mg by mouth daily.   Yes [provider]  levothyroxine (SYNTHROID, LEVOTHROID) 50 MCG tablet Take 50 mcg by mouth daily.     Yes [provider]  LORazepam (ATIVAN) 2 MG tablet Take 2 mg by mouth at bedtime.    Yes [provider]  losartan (COZAAR) 100 MG tablet TK 1 T PO D 01/14/17  Yes [provider]  Multiple Vitamins-Minerals (MULTIVITAMIN WITH MINERALS) tablet Take 1 tablet by mouth daily.     Yes [provider]  raloxifene (EVISTA) 60 MG tablet Take 60 mg by mouth daily. three times a week   Yes [provider]  sotalol (BETAPACE) 80 MG tablet Take 80 mg by mouth 2 (two) times daily.   Yes [provider]  spironolactone (ALDACTONE) 25 MG tablet Take 1 tablet by mouth daily. 07/21/17  Yes [provider]  doxycycline (VIBRAMYCIN) 100 MG capsule Take 1 capsule (100 mg total) by mouth 2 (two) times daily. 12/13/17   Daleen Bo, MD  hydroxychloroquine (PLAQUENIL) 200 MG tablet TAKE 1 TABLET BY MOUTH DAILY 12/14/17   Bo Merino, MD    Family History Family History  Problem Relation Age of Onset  . Breast cancer Mother   . Lung cancer Mother   . Cancer Father        larynx  . Heart failure Father   . Heart disease Brother   . Colon cancer Brother   . Pseudochol deficiency Other   . Breast cancer Sister   . Cancer Sister        thyroid   . Breast cancer Maternal Grandmother   . Autoimmune disease Daughter   . Cancer Son        prostate   . Rheum arthritis Grandchild   . Esophageal cancer Neg Hx   . Rectal cancer Neg Hx     Social History Social History   Tobacco Use  . Smoking status: Never Smoker  . Smokeless tobacco: Never Used  Substance Use Topics  . Alcohol use: Yes    Comment: very rare  . Drug use: Never     Allergies   Codeine; Robinul [glycopyrrolate]; and Sulfamethoxazole-trimethoprim   Review of Systems Review of Systems  Musculoskeletal:       Shoulder pain the other day  Skin: Negative for rash.  All other systems reviewed and are negative.    Physical Exam Updated Vital Signs BP (!) 131/109   Pulse 72   Temp 98.3 F (36.8 C) (Oral)   Resp 17   SpO2 100%   Physical Exam  Constitutional: She appears well-developed and well-nourished. No distress.  HENT:  Head: Normocephalic and  atraumatic.  Right Ear: External ear normal.  Left Ear: External ear normal.  Eyes: Conjunctivae are normal. Right eye exhibits no discharge. Left eye exhibits no discharge. No scleral icterus.  Neck: Neck supple. No tracheal deviation present.  Cardiovascular: Normal rate, regular rhythm and intact distal pulses.  Pulmonary/Chest: Effort normal and breath sounds normal. No stridor. No respiratory distress. She has no wheezes. She has no rales.  Abdominal: Soft.  Bowel sounds are normal. She exhibits no distension. There is no tenderness. There is no rebound and no guarding.  Musculoskeletal: She exhibits no edema or tenderness.  Neurological: She is alert. She has normal strength. No cranial nerve deficit (no facial droop, extraocular movements intact, no slurred speech) or sensory deficit. She exhibits normal muscle tone. She displays no seizure activity. Coordination normal.  Skin: Skin is warm and dry. No rash noted.  Psychiatric: She has a normal mood and affect.  Nursing note and vitals reviewed.    ED Treatments / Results  Labs (all labs ordered are listed, but only abnormal results are displayed) Labs Reviewed  URINE CULTURE - Abnormal; Notable for the following components:      Result Value   Culture   (*)    Value: <10,000 COLONIES/mL INSIGNIFICANT GROWTH Performed at [facility], [address] [place], [place] [address]    All other components within normal limits  COMPREHENSIVE METABOLIC PANEL - Abnormal; Notable for the following components:   Sodium 126 (*)    Chloride 94 (*)    Creatinine, Ser 1.16 (*)    Total Protein 8.3 (*)    AST 52 (*)    GFR calc non Af Amer 44 (*)    GFR calc Af Amer 51 (*)    All other components within normal limits  CBC WITH DIFFERENTIAL/PLATELET - Abnormal; Notable for the following components:   WBC 3.0 (*)    Platelets 133 (*)    All other components within normal limits  CULTURE, BLOOD (ROUTINE X 2)  CULTURE, BLOOD  (ROUTINE X 2)  URINALYSIS, ROUTINE W REFLEX MICROSCOPIC  TROPONIN I  ROCKY MTN SPOTTED FVR ABS PNL(IGG+IGM)  CBG MONITORING, ED  I-STAT CG4 LACTIC ACID, ED    EKG EKG Interpretation  Date/Time:  Tuesday Dec 13 2017 12:11:26 EDT Ventricular Rate:  67 PR Interval:    QRS Duration: 88 QT Interval:  444 QTC Calculation: 469 R Axis:   70 Text Interpretation:  Age not entered, assumed to be  78 years old for purpose of ECG interpretation Sinus rhythm Prolonged PR interval Anterior infarct, old Minimal ST depression, anterolateral leads No significant change since last tracing Confirmed by Dorie Rank [phone]) on 12/13/2017 12:46:20 PM Also confirmed by Dorie Rank [phone]), editor Lynder Parents [phone])  on 12/14/2017 9:30:10 AM   Radiology Dg Chest 2 View  Result Date: 12/13/2017 CLINICAL DATA:  Cough, fever, shortness of breath EXAM: CHEST - 2 VIEW COMPARISON:  03/28/2016 FINDINGS: There is hyperinflation of the lungs compatible with COPD. Heart and mediastinal contours are within normal limits. No focal opacities or effusions. No acute bony abnormality. IMPRESSION: COPD.  No active disease. Electronically Signed   By: Rolm Baptise M.D.   On: 12/13/2017 14:20    Procedures Procedures (including critical care time)  Medications Ordered in ED Medications  sodium chloride 0.9 % bolus 1,000 mL (0 mLs Intravenous Stopped 12/13/17 1440)    And  sodium chloride 0.9 % bolus 1,000 mL (0 mLs Intravenous Stopped 12/13/17 1550)    And  sodium chloride 0.9 % bolus 250 mL (0 mLs Intravenous Stopped 12/13/17 1753)  doxycycline (VIBRA-TABS) tablet 100 mg (100 mg Oral Given 12/13/17 1856)     Initial Impression / Assessment and Plan / ED Course  I have reviewed the triage vital signs and the nursing notes.  Pertinent labs & imaging results that were available during my care of the patient were reviewed by me and  considered in my medical decision making (see chart for details).  Pt presented to the  ED with possible fever, infection.  ED workup reassuring.  NO PNA on cxr.  Labs notable for hyponatremia but history of same.  Pt treated with IV fluids.  Lactic acid and WBC normal.  Pt improved with treatment.  BP improved.  Pt was feeling better.  Dr Eulis Foster followed up on UA.  Plan was for empiric treatment of possible tickborne illness considering no other etiology identified.  Pt and daughter comfortable with discharge.   Final Clinical Impressions(s) / ED Diagnoses   Final diagnoses:  Fever, unspecified fever cause    ED Discharge Orders        Ordered    doxycycline (VIBRAMYCIN) 100 MG capsule  2 times daily     12/13/17 1828       Dorie Rank, MD 12/14/17 1840

## 2017-12-13 NOTE — Patient Instructions (Signed)
Iliotibial Band Syndrome Rehab Ask your health care provider which exercises are safe for you. Do exercises exactly as told by your health care provider and adjust them as directed. It is normal to feel mild stretching, pulling, tightness, or discomfort as you do these exercises, but you should stop right away if you feel sudden pain or your pain gets worse.Do not begin these exercises until told by your health care provider. Stretching and range of motion exercises These exercises warm up your muscles and joints and improve the movement and flexibility of your hip and pelvis. Exercise A: Quadriceps, prone  1. Lie on your abdomen on a firm surface, such as a bed or padded floor. 2. Bend your left / right knee and hold your ankle. If you cannot reach your ankle or pant leg, loop a belt around your foot and grab the belt instead. 3. Gently pull your heel toward your buttocks. Your knee should not slide out to the side. You should feel a stretch in the front of your thigh and knee. 4. Hold this position for __________ seconds. Repeat __________ times. Complete this stretch __________ times a day. Exercise B: Iliotibial band  1. Lie on your side with your left / right leg in the top position. 2. Bend both of your knees and grab your left / right ankle. Stretch out your bottom arm to help you balance. 3. Slowly bring your top knee back so your thigh goes behind your trunk. 4. Slowly lower your top leg toward the floor until you feel a gentle stretch on the outside of your left / right hip and thigh. If you do not feel a stretch and your knee will not fall farther, place the heel of your other foot on top of your knee and pull your knee down toward the floor with your foot. 5. Hold this position for __________ seconds. Repeat __________ times. Complete this stretch __________ times a day. Strengthening exercises These exercises build strength and endurance in your hip and pelvis. Endurance is the  ability to use your muscles for a long time, even after they get tired. Exercise C: Straight leg raises ( hip abductors) 1. Lie on your side with your left / right leg in the top position. Lie so your head, shoulder, knee, and hip line up. You may bend your bottom knee to help you balance. 2. Roll your hips slightly forward so your hips are stacked directly over each other and your left / right knee is facing forward. 3. Tense the muscles in your outer thigh and lift your top leg 4-6 inches (10-15 cm). 4. Hold this position for __________ seconds. 5. Slowly return to the starting position. Let your muscles relax completely before doing another repetition. Repeat __________ times. Complete this exercise __________ times a day. Exercise D: Straight leg raises ( hip extensors) 1. Lie on your abdomen on your bed or a firm surface. You can put a pillow under your hips if that is more comfortable. 2. Bend your left / right knee so your foot is straight up in the air. 3. Squeeze your buttock muscles and lift your left / right thigh off the bed. Do not let your back arch. 4. Tense this muscle as hard as you can without increasing any knee pain. 5. Hold this position for __________ seconds. 6. Slowly lower your leg to the starting position and allow it to relax completely. Repeat __________ times. Complete this exercise __________ times a day. Exercise E: Hip  hike 1. Stand sideways on a bottom step. Stand on your left / right leg with your other foot unsupported next to the step. You can hold onto the railing or wall if needed for balance. 2. Keep your knees straight and your torso square. Then, lift your left / right hip up toward the ceiling. 3. Slowly let your left / right hip lower toward the floor, past the starting position. Your foot should get closer to the floor. Do not lean or bend your knees. Repeat __________ times. Complete this exercise __________ times a day. This information is not  intended to replace advice given to you by your health care provider. Make sure you discuss any questions you have with your health care provider. Document Released: 07/12/2005 Document Revised: 03/16/2016 Document Reviewed: 06/13/2015 Elsevier Interactive Patient Education  2018 [place]. Trochanteric Bursitis Rehab Ask your health care provider which exercises are safe for you. Do exercises exactly as told by your health care provider and adjust them as directed. It is normal to feel mild stretching, pulling, tightness, or discomfort as you do these exercises, but you should stop right away if you feel sudden pain or your pain gets worse.Do not begin these exercises until told by your health care provider. Stretching exercises These exercises warm up your muscles and joints and improve the movement and flexibility of your hip. These exercises also help to relieve pain and stiffness. Exercise A: Iliotibial band stretch  1. Lie on your side with your left / right leg in the top position. 2. Bend your left / right knee and grab your ankle. 3. Slowly bring your knee back so your thigh is behind your body. 4. Slowly lower your knee toward the floor until you feel a gentle stretch on the outside of your left / right thigh. If you do not feel a stretch and your knee will not fall farther, place the heel of your other foot on top of your outer knee and pull your thigh down farther. 5. Hold this position for __________ seconds. 6. Slowly return to the starting position. Repeat __________ times. Complete this exercise __________ times a day. Strengthening exercises These exercises build strength and endurance in your hip and pelvis. Endurance is the ability to use your muscles for a long time, even after they get tired. Exercise B: Bridge ( hip extensors) 1. Lie on your back on a firm surface with your knees bent and your feet flat on the floor. 2. Tighten your buttocks muscles and lift your  buttocks off the floor until your trunk is level with your thighs. You should feel the muscles working in your buttocks and the back of your thighs. If this exercise is too easy, try doing it with your arms crossed over your chest. 3. Hold this position for __________ seconds. 4. Slowly return to the starting position. 5. Let your muscles relax completely between repetitions. Repeat __________ times. Complete this exercise __________ times a day. Exercise C: Squats ( knee extensors and  quadriceps) 1. Stand in front of a table, with your feet and knees pointing straight ahead. You may rest your hands on the table for balance but not for support. 2. Slowly bend your knees and lower your hips like you are going to sit in a chair. ? Keep your weight over your heels, not over your toes. ? Keep your lower legs upright so they are parallel with the table legs. ? Do not let your hips go lower than your  knees. ? Do not bend lower than told by your health care provider. ? If your hip pain increases, do not bend as low. 3. Hold this position for __________ seconds. 4. Slowly push with your legs to return to standing. Do not use your hands to pull yourself to standing. Repeat __________ times. Complete this exercise __________ times a day. Exercise D: Hip hike 1. Stand sideways on a bottom step. Stand on your left / right leg with your other foot unsupported next to the step. You can hold onto the railing or wall if needed for balance. 2. Keeping your knees straight and your torso square, lift your left / right hip up toward the ceiling. 3. Hold this position for __________ seconds. 4. Slowly let your left / right hip lower toward the floor, past the starting position. Your foot should get closer to the floor. Do not lean or bend your knees. Repeat __________ times. Complete this exercise __________ times a day. Exercise E: Single leg stand 1. Stand near a counter or door frame that you can hold onto  for balance as needed. It is helpful to stand in front of a mirror for this exercise so you can watch your hip. 2. Squeeze your left / right buttock muscles then lift up your other foot. Do not let your left / right hip push out to the side. 3. Hold this position for __________ seconds. Repeat __________ times. Complete this exercise __________ times a day. This information is not intended to replace advice given to you by your health care provider. Make sure you discuss any questions you have with your health care provider. Document Released: 08/19/2004 Document Revised: 03/18/2016 Document Reviewed: 06/27/2015 Elsevier Interactive Patient Education  [employer].

## 2017-12-13 NOTE — Discharge Instructions (Addendum)
There was no clear cause for your fever tonight.  Since you have been bitten by a tick, we advised treating for tick fever with doxycycline.  Make sure that you are getting plenty of rest, drink a lot of fluids, and use Tylenol as needed for fever.  Return here or see your doctor, as needed for problems.

## 2017-12-14 LAB — URINE CULTURE

## 2017-12-14 NOTE — Telephone Encounter (Signed)
ok 

## 2017-12-14 NOTE — Telephone Encounter (Signed)
Last visit: 12/13/2017 Next visit: 04/25/2018 Labs: 12/13/2017  AST 52, GFR 51, WBC 3.0, Creat 1.16 Eye exam: 03/30/2017  Okay to refill PLQ?

## 2017-12-15 LAB — ROCKY MTN SPOTTED FVR ABS PNL(IGG+IGM)
RMSF IGG: NEGATIVE
RMSF IgM: 0.13 index (ref 0.00–0.89)

## 2017-12-18 LAB — CULTURE, BLOOD (ROUTINE X 2)
CULTURE: NO GROWTH
SPECIAL REQUESTS: ADEQUATE

## 2017-12-30 ENCOUNTER — Encounter: Payer: Self-pay | Admitting: Physician Assistant

## 2017-12-30 DIAGNOSIS — I10 Essential (primary) hypertension: Secondary | ICD-10-CM | POA: Diagnosis not present

## 2017-12-30 DIAGNOSIS — D899 Disorder involving the immune mechanism, unspecified: Secondary | ICD-10-CM | POA: Diagnosis not present

## 2017-12-30 DIAGNOSIS — M810 Age-related osteoporosis without current pathological fracture: Secondary | ICD-10-CM | POA: Diagnosis not present

## 2017-12-30 DIAGNOSIS — E038 Other specified hypothyroidism: Secondary | ICD-10-CM | POA: Diagnosis not present

## 2017-12-30 DIAGNOSIS — E7849 Other hyperlipidemia: Secondary | ICD-10-CM | POA: Diagnosis not present

## 2017-12-30 DIAGNOSIS — M797 Fibromyalgia: Secondary | ICD-10-CM | POA: Diagnosis not present

## 2017-12-30 DIAGNOSIS — M3500 Sicca syndrome, unspecified: Secondary | ICD-10-CM | POA: Diagnosis not present

## 2017-12-30 DIAGNOSIS — I7300 Raynaud's syndrome without gangrene: Secondary | ICD-10-CM | POA: Diagnosis not present

## 2017-12-30 DIAGNOSIS — R82998 Other abnormal findings in urine: Secondary | ICD-10-CM | POA: Diagnosis not present

## 2018-01-06 DIAGNOSIS — I7300 Raynaud's syndrome without gangrene: Secondary | ICD-10-CM | POA: Diagnosis not present

## 2018-01-06 DIAGNOSIS — D696 Thrombocytopenia, unspecified: Secondary | ICD-10-CM | POA: Diagnosis not present

## 2018-01-06 DIAGNOSIS — Z23 Encounter for immunization: Secondary | ICD-10-CM | POA: Diagnosis not present

## 2018-01-06 DIAGNOSIS — I1310 Hypertensive heart and chronic kidney disease without heart failure, with stage 1 through stage 4 chronic kidney disease, or unspecified chronic kidney disease: Secondary | ICD-10-CM | POA: Diagnosis not present

## 2018-01-06 DIAGNOSIS — Z0000 Encounter for general adult medical examination without abnormal findings: Secondary | ICD-10-CM | POA: Diagnosis not present

## 2018-01-06 DIAGNOSIS — M3500 Sicca syndrome, unspecified: Secondary | ICD-10-CM | POA: Diagnosis not present

## 2018-01-06 DIAGNOSIS — F1320 Sedative, hypnotic or anxiolytic dependence, uncomplicated: Secondary | ICD-10-CM | POA: Diagnosis not present

## 2018-01-06 DIAGNOSIS — D899 Disorder involving the immune mechanism, unspecified: Secondary | ICD-10-CM | POA: Diagnosis not present

## 2018-01-06 DIAGNOSIS — Z6823 Body mass index (BMI) 23.0-23.9, adult: Secondary | ICD-10-CM | POA: Diagnosis not present

## 2018-01-06 DIAGNOSIS — M3210 Systemic lupus erythematosus, organ or system involvement unspecified: Secondary | ICD-10-CM | POA: Diagnosis not present

## 2018-01-06 DIAGNOSIS — Z1389 Encounter for screening for other disorder: Secondary | ICD-10-CM | POA: Diagnosis not present

## 2018-01-06 DIAGNOSIS — F325 Major depressive disorder, single episode, in full remission: Secondary | ICD-10-CM | POA: Diagnosis not present

## 2018-01-09 DIAGNOSIS — Z1212 Encounter for screening for malignant neoplasm of rectum: Secondary | ICD-10-CM | POA: Diagnosis not present

## 2018-02-02 DIAGNOSIS — M3210 Systemic lupus erythematosus, organ or system involvement unspecified: Secondary | ICD-10-CM | POA: Diagnosis not present

## 2018-02-02 DIAGNOSIS — J0100 Acute maxillary sinusitis, unspecified: Secondary | ICD-10-CM | POA: Diagnosis not present

## 2018-02-02 DIAGNOSIS — Z6824 Body mass index (BMI) 24.0-24.9, adult: Secondary | ICD-10-CM | POA: Diagnosis not present

## 2018-02-02 DIAGNOSIS — H1030 Unspecified acute conjunctivitis, unspecified eye: Secondary | ICD-10-CM | POA: Diagnosis not present

## 2018-02-02 DIAGNOSIS — R05 Cough: Secondary | ICD-10-CM | POA: Diagnosis not present

## 2018-02-02 DIAGNOSIS — I10 Essential (primary) hypertension: Secondary | ICD-10-CM | POA: Diagnosis not present

## 2018-03-16 DIAGNOSIS — H04123 Dry eye syndrome of bilateral lacrimal glands: Secondary | ICD-10-CM | POA: Diagnosis not present

## 2018-03-16 DIAGNOSIS — Z09 Encounter for follow-up examination after completed treatment for conditions other than malignant neoplasm: Secondary | ICD-10-CM | POA: Diagnosis not present

## 2018-03-16 DIAGNOSIS — H2513 Age-related nuclear cataract, bilateral: Secondary | ICD-10-CM | POA: Diagnosis not present

## 2018-03-16 DIAGNOSIS — Z049 Encounter for examination and observation for unspecified reason: Secondary | ICD-10-CM | POA: Diagnosis not present

## 2018-03-16 DIAGNOSIS — H353131 Nonexudative age-related macular degeneration, bilateral, early dry stage: Secondary | ICD-10-CM | POA: Diagnosis not present

## 2018-03-16 DIAGNOSIS — H40013 Open angle with borderline findings, low risk, bilateral: Secondary | ICD-10-CM | POA: Diagnosis not present

## 2018-03-16 DIAGNOSIS — Z79899 Other long term (current) drug therapy: Secondary | ICD-10-CM | POA: Diagnosis not present

## 2018-03-20 ENCOUNTER — Other Ambulatory Visit: Payer: Self-pay | Admitting: Rheumatology

## 2018-03-21 NOTE — Telephone Encounter (Signed)
Last visit: 12/13/2017 Next visit: 04/25/2018 Labs: 12/13/2017  AST 52, GFR 51, WBC 3.0, Creat 1.16 Eye exam: 03/30/2017  Okay to refill per Dr. Estanislado Pandy

## 2018-04-11 NOTE — Progress Notes (Signed)
Office Visit Note  Patient: Alice Medina             Date of Birth: 1941-04-25           MRN: [id number]             PCP: Shon Baton, MD Referring: Shon Baton, MD Visit Date: 04/25/2018 Occupation: @GUAROCC @  Subjective:  Cough   History of Present Illness: Tawni Melkonian is a 77 y.o. female with history of autoimmune disease, Sjogren's, osteoarthritis.  Patient is on PLQ 200 mg 1 tablet by mouth daily.  Patient reports that she was started on Z-Pak as well as Symbicort yesterday for bronchitis.  She states she has had bronchitis off and on since May.  She states that she is holding her Plaquenil dose until she finished the pack.  She states that she had chest x-ray last week which was unremarkable.  She states she continues to follow-up with her cardiologist Dr. Wynonia Lawman.  She states that she had lab work on 04/17/2018 that revealed low sodium.  She states that she did have a flu vaccine on Saturday.  She denies any recent autoimmune flares.  She continues to have sicca symptoms.  She has intermittent mouth sores but denies any no sores.  She continues to have chronic fatigue.  She has occasional discomfort in bilateral hands but denies any joint swelling.  She continues to have bursitis and IT band syndrome bilaterally.  She is been having increased discomfort in her mid and lower back.  She states that her primary care has been ordering her bone densities.  She states she is due for her next bone density in May 2020.  She has been taking calcium and vitamin D daily.    Activities of Daily Living:  Patient reports morning stiffness for 15-20  minutes.   Patient Reports nocturnal pain.  Difficulty dressing/grooming: Denies Difficulty climbing stairs: Reports Difficulty getting out of chair: Reports Difficulty using hands for taps, buttons, cutlery, and/or writing: Denies  Review of Systems  Constitutional: Positive for fatigue.  HENT: Positive for mouth sores (Intermittently) and  mouth dryness. Negative for nose dryness.   Eyes: Positive for dryness. Negative for pain and visual disturbance.  Respiratory: Positive for cough. Negative for hemoptysis, shortness of breath and difficulty breathing.   Cardiovascular: Positive for palpitations. Negative for chest pain, hypertension and swelling in legs/feet.  Gastrointestinal: Negative for blood in stool, constipation and diarrhea.  Endocrine: Negative for increased urination.  Genitourinary: Negative for painful urination.  Musculoskeletal: Positive for arthralgias and joint pain. Negative for joint swelling, myalgias, muscle weakness, morning stiffness, muscle tenderness and myalgias.  Skin: Negative for color change, pallor, rash, hair loss, nodules/bumps, skin tightness, ulcers and sensitivity to sunlight.  Allergic/Immunologic: Negative for susceptible to infections.  Neurological: Negative for dizziness, numbness, headaches and weakness.  Hematological: Negative for swollen glands.  Psychiatric/Behavioral: Positive for sleep disturbance. Negative for depressed mood. The patient is not nervous/anxious.     PMFS History:  Patient Active Problem List   Diagnosis Date Noted  . Multiple closed fractures of metacarpal bone 07/25/2017  . Autoimmune disease ([place]) +ANA +Ro +La +RF oral ulcers nasal ulcers Raynaud photosensitivity SICCA  01/13/2017  . High risk medication use 01/13/2017  . Photosensitivity 01/13/2017  . Sjogren's syndrome with keratoconjunctivitis sicca ([place]) 01/13/2017  . Recurrent oral ulcers and nasal ulcers  01/13/2017  . Raynaud's disease without gangrene 01/13/2017  . Primary osteoarthritis of both hands 01/13/2017  . Primary osteoarthritis of both  knees 01/13/2017  . Primary osteoarthritis of both feet 01/13/2017  . Osteopenia of multiple sites 01/13/2017  . History of hypertension 01/13/2017  . History of hypothyroidism 01/13/2017  . History of gastroesophageal reflux (GERD)/ PUD 01/13/2017  .  History of MRSA infection 01/13/2017  . Diarrhea 05/28/2013  . Family history of malignant neoplasm of gastrointestinal tract 06/22/2011  . ADENOCARCINOMA, BREAST 05/02/2009  . CORONARY ARTERY DISEASE 05/02/2009  . DYSPHAGIA UNSPECIFIED 05/02/2009    Past Medical History:  Diagnosis Date  . Anxiety   . Arthritis   . Atrial fibrillation ([place])   . Autoimmune disorder ([place])    SJORGREN'S  . Breast cancer ([place])    right breast  . Cardiac arrhythmia due to congenital heart disease   . Chronic headaches   . Depression   . GERD (gastroesophageal reflux disease)   . Hypertension   . Myocardial infarction ([place])    1990  . Osteoporosis   . Status post dilation of esophageal narrowing   . Thyroid disease   . Ulcer     Family History  Problem Relation Age of Onset  . Breast cancer Mother   . Lung cancer Mother   . Cancer Father        larynx  . Heart failure Father   . Heart disease Brother   . Colon cancer Brother   . Pseudochol deficiency Other   . Breast cancer Sister   . Cancer Sister        thyroid   . Breast cancer Maternal Grandmother   . Autoimmune disease Daughter   . Cancer Son        prostate   . Rheum arthritis Grandchild   . Esophageal cancer Neg Hx   . Rectal cancer Neg Hx    Past Surgical History:  Procedure Laterality Date  . APPENDECTOMY    . breast reconstuction     x 2  . CHOLECYSTECTOMY    . COLONOSCOPY    . double mastectomy    . PARTIAL HYSTERECTOMY  1990  . TIBIA FRACTURE SURGERY Right    with subsequent hardware removal  . UPPER GASTROINTESTINAL ENDOSCOPY    . WRIST FRACTURE SURGERY Left    Social History   Social History Narrative   1 cup of coffee daily    Objective: Vital Signs: BP 107/69 (BP Location: Left Arm, Patient Position: Sitting, Cuff Size: Normal)   Pulse 68   Resp 13   Ht 5' 5.5" (1.664 m)   Wt 148 lb 3.2 oz (67.2 kg)   BMI 24.29 kg/m    Physical Exam  Constitutional: She is oriented to person, place, and time.  She appears well-developed and well-nourished.  HENT:  Head: Normocephalic and atraumatic.  Eyes: Conjunctivae and EOM are normal.  Neck: Normal range of motion.  Cardiovascular: Normal rate, regular rhythm, normal heart sounds and intact distal pulses.  Pulmonary/Chest: Effort normal. She has wheezes.  Abdominal: Soft. Bowel sounds are normal.  Lymphadenopathy:    She has no cervical adenopathy.  Neurological: She is alert and oriented to person, place, and time.  Skin: Skin is warm and dry. Capillary refill takes less than 2 seconds.  Psychiatric: She has a normal mood and affect. Her behavior is normal.  Nursing note and vitals reviewed.    Musculoskeletal Exam: C-spine good ROM.  Thoracic and lumbar spine limited ROM. Shoulder joints, elbow joints, wrist joints, MCPs, PIPs, and DIPs good ROM with no synovitis. PIP and DIP synovial thickening.  Complete  fist formation bilaterally.  Hip joints, knee joints, ankle joints, MTPs, PIPs, and DIPs good ROM with no synovitis.  No warmth or effusion of knee joints.  No tenderness of trochanteric bursa bilaterally.   CDAI Exam: CDAI Score: Not documented Patient Global Assessment: Not documented; Provider Global Assessment: Not documented Swollen: Not documented; Tender: Not documented Joint Exam   Not documented   There is currently no information documented on the homunculus. Go to the Rheumatology activity and complete the homunculus joint exam.  Investigation: No additional findings.  Imaging: Dg Chest 2 View  Result Date: 04/18/2018 CLINICAL DATA:  Patient with cough. EXAM: CHEST - 2 VIEW COMPARISON:  Chest radiograph 12/13/2017 FINDINGS: Normal cardiac and mediastinal contours. No consolidative pulmonary opacities. No pleural effusion or pneumothorax. Regional skeleton is unremarkable. IMPRESSION: No acute cardiopulmonary process. Electronically Signed   By: Lovey Newcomer M.D.   On: 04/18/2018 08:35    Recent Labs: Lab Results    Component Value Date   WBC 3.0 (L) 12/13/2017   HGB 14.6 12/13/2017   PLT 133 (L) 12/13/2017   NA 126 (L) 12/13/2017   K 4.7 12/13/2017   CL 94 (L) 12/13/2017   CO2 22 12/13/2017   GLUCOSE 97 12/13/2017   BUN 18 12/13/2017   CREATININE 1.16 (H) 12/13/2017   BILITOT 0.8 12/13/2017   ALKPHOS 79 12/13/2017   AST 52 (H) 12/13/2017   ALT 22 12/13/2017   PROT 8.3 (H) 12/13/2017   ALBUMIN 3.6 12/13/2017   CALCIUM 8.9 12/13/2017   GFRAA 51 (L) 12/13/2017    Speciality Comments: No specialty comments available.  Procedures:  No procedures performed Allergies: Codeine; Robinul [glycopyrrolate]; and Sulfamethoxazole-trimethoprim   Assessment / Plan:     Visit Diagnoses: Autoimmune disease ([place]) +ANA +Ro +La +RF oral ulcers nasal ulcers Raynaud photosensitivity SICCA: She has not had any recent flares of autoimmune disease.  She is clinically been doing well on Plaquenil 200 mg 1 tablet by mouth daily.  She continues to have sicca symptoms and intermittent oral ulcerations.  She has no synovitis on exam today.  She continues to have discomfort in bilateral hands but no joint swelling was noted.  She has chronic fatigue.  She has had bronchitis off and on since May.  She is currently taking a Z-Pak and was started on Symbicort inhaler.  Wheezes were auscultated on exam.  We discussed following up with her primary care if she continues to be symptomatic after she completes the antibiotic.  She is holding her Plaquenil until she is finished her antibiotics.  Autoimmune labs are checked on 12/30/2017: ANA was positive, Ro positive, positive, RF positive, double-stranded DNA 1 complements were within normal limits.  CBC and CMP today 04/17/2018.  She had low sodium which is being further evaluated by her cardiologist Dr. Wynonia Lawman.  CBC was within normal limits.  She is advised to notify us if she develops any new or worsening symptoms.  High risk medication use - PLQ 200 mg 1 tablet by mouth daily.  CBC  and CMP were drawn on 04/17/2018.  Sjogren's syndrome with keratoconjunctivitis sicca ([place]): She continues to have sicca symptoms.  No parotid swelling was noted on exam.  She takes Plaquenil 200 mg 1 tablet by mouth daily.  Raynaud's disease without gangrene: She has intermittent symptoms of Raynaud's.  No digital ulcerations or signs of gangrene were noted.  Primary osteoarthritis of both hands: She has PIP and DIP synovial thickening consistent with osteoarthritis of bilateral hands.  No  synovitis was noted.  She is complete fist formation bilaterally.  Joint protection and muscle strengthening were discussed.  Primary osteoarthritis of both knees: She has good range of motion with no discomfort.  No warmth or effusion was noted.  Primary osteoarthritis of both feet: She has osteoarthritic changes in bilateral feet.  She has no discomfort at this time.  She wears proper fitting shoes.  Pain of right sacroiliac joint: She has no SI joint tenderness on exam today.  Osteopenia of multiple sites: Per patient her next DEXA is due in May 2020.  She has been taking calcium and vitamin D on a daily basis.  Other medical conditions are listed as follows:  History of gastroesophageal reflux (GERD)/ PUD  History of MRSA infection  History of hypothyroidism   Orders: No orders of the defined types were placed in this encounter.  No orders of the defined types were placed in this encounter.     Follow-Up Instructions: Return in about 5 months (around 09/24/2018) for Autoimmune Disease, Sjogren's syndrome, Osteoarthritis.   Ofilia Neas, PA-C  Note - This record has been created using Dragon software.  Chart creation errors have been sought, but may not always  have been located. Such creation errors do not reflect on  the standard of medical care.

## 2018-04-17 ENCOUNTER — Ambulatory Visit
Admission: RE | Admit: 2018-04-17 | Discharge: 2018-04-17 | Disposition: A | Discharge status: Home / Self Care | Payer: PPO | Source: Ambulatory Visit | Attending: Cardiology | Admitting: Cardiology

## 2018-04-17 ENCOUNTER — Other Ambulatory Visit: Payer: Self-pay | Admitting: Cardiology

## 2018-04-17 ENCOUNTER — Encounter: Payer: Self-pay | Admitting: Cardiology

## 2018-04-17 DIAGNOSIS — R0602 Shortness of breath: Secondary | ICD-10-CM

## 2018-04-17 DIAGNOSIS — E785 Hyperlipidemia, unspecified: Secondary | ICD-10-CM | POA: Diagnosis not present

## 2018-04-17 DIAGNOSIS — R05 Cough: Secondary | ICD-10-CM | POA: Diagnosis not present

## 2018-04-17 DIAGNOSIS — I119 Hypertensive heart disease without heart failure: Secondary | ICD-10-CM | POA: Diagnosis not present

## 2018-04-17 DIAGNOSIS — I2510 Atherosclerotic heart disease of native coronary artery without angina pectoris: Secondary | ICD-10-CM | POA: Diagnosis not present

## 2018-04-17 DIAGNOSIS — E039 Hypothyroidism, unspecified: Secondary | ICD-10-CM | POA: Diagnosis not present

## 2018-04-17 DIAGNOSIS — I480 Paroxysmal atrial fibrillation: Secondary | ICD-10-CM | POA: Diagnosis not present

## 2018-04-17 DIAGNOSIS — Z79899 Other long term (current) drug therapy: Secondary | ICD-10-CM | POA: Diagnosis not present

## 2018-04-17 NOTE — Progress Notes (Unsigned)
Alice Medina    Date of visit:  04/17/2018 DOB:  [birth year]    Age:  77 yrs. Medical record number:  [id number]     Account number:  [id number] Primary Care Provider: Precious Reel ____________________________ CURRENT DIAGNOSES  1. Paroxysmal atrial fibrillation  2. Dyspnea  3. Hypertensive heart disease without heart failure  4. CAD Native without angina  5. Other long term (current) drug therapy  6. Hyperlipidemia  7. Systemic Lupus Erythematosus  8. Hypothyroidism ____________________________ ALLERGIES  Codeine, Intolerance-unknown  Sulfa (Sulfonamides), Intolerance-unknown ____________________________ MEDICATIONS  1. amlodipine 5 mg tablet, 1/2 tab daily  2. aspirin 325 mg tablet, PRN  3. cyclobenzaprine 10 mg tablet, PRN  4. Cymbalta 60 mg capsule,delayed release(DR/EC), 1 p.o. daily  5. Evista 60 mg tablet, three times per wek  6. hydroxychloroquine 200 mg tablet, 1 p.o. daily  7. Levoxyl 50 mcg tablet, 1 p.o. q.d.  8. lorazepam 2 mg tablet, QHS  9. losartan 100 mg tablet, 1 p.o. daily  10. multivitamin tablet, 3 to 4 days per week  11. nitroglycerin 0.4 mg tablet, sublingual, PRN  12. Refresh Tears 0.5 % eye drops, PRN  13. sotalol 160 mg tablet, 1/2 tablet by mouth twice a day  14. spironolactone 25 mg tablet, 1 p.o. daily  15. Systane Balance 0.6 % eye drops, Take as directed  16. Vitamin D3 1,000 unit capsule, 1 p.o. daily ____________________________ CHIEF COMPLAINTS  Followup of CAD Native without angina  Followup of Hypertensive heart disease without heart failure  Followup of Paroxysmal atrial fibrillation ____________________________ HISTORY OF PRESENT ILLNESS  The patient returns for cardiac followup. She has had a fairly persistent cough since earlier in the year and has also gone to the emergency room with some hypotension and cough as well as fever. She is currently treated with Plaquenil and also has Sjogren's syndrome. She was seen emergency room  with thick bites and also is followed by the rheumatologist. She hasn't really seen a PCP since the cough. She feels that she has a lot of nasal drainage and other symptoms that are cough is largely nonproductive. She denies angina. She has not had any recurrence of atrial fibrillation and his INR had A. fib in a long time. Hair CAD has been stable. She denies PND, orthopnea or edema. After she was in the emergency room or amlodipine was reduced. She is tolerating sotalol well. ____________________________ PAST HISTORY  Past Medical Illnesses:  hyperlipidemia, carcinoma-breast, lupus erythematous, hypothyroidism, hypertension, osteoporosis, history of depression, history of hyperthyroidism treated with RAI, esophageal  stricture;  Cardiovascular Illnesses:  S/P MI-subendocardial, angina (chronic), atrial fibrillation, history of arrhythmia-PAT, arrhythmia-PVCs;  Surgical Procedures:  bilateral mastectomies, cholecystectomy, hysterectomy, tubal ligation, reconstructive breast surgery with removal later of implants, ORIF of tibial fractures;  NYHA Classification:  I;  Canadian Angina Classification:  Class 0: Asymptomatic;  Cardiology Procedures-Invasive:  cardiac cath (left) April 1990;  Cardiology Procedures-Noninvasive:  echocardiogram October 2009, adenosine cardiolite October 2009, echocardiogram November 2013, lexiscan cardiolite December 2013, event monitor June 2015;  Cardiac Cath Results:  normal Left main, normal CFX, normal RCA, myocardial bridge in LAD;  LVEF of 63% documented via echocardiogram on 09/15/2015,  CHADS Score:  2,  CHA2DS2-VASC Score:  3 ____________________________ CARDIO-PULMONARY TEST DATES EKG Date:  01/14/2017;   Cardiac Cath Date:  10/29/1988;  Nuclear Study Date:  06/29/2012;  Echocardiography Date: 09/15/2015;   ____________________________ FAMILY HISTORY Brother -- Brother alive with problem, Coronary Artery Disease Father -- Father dead, Congestive heart  failure Mother --  Mother dead, Carcinoma of breast Sister -- Sister alive and well ____________________________ SOCIAL HISTORY Alcohol Use:  does not use alcohol;  Smoking:  does not smoke;  Diet:  regular diet;  Lifestyle:  widowed;  Education:  college degree;  Exercise:  some exercise;  Occupation:  retired Conservator, museum/gallery;  Residence:  lives alone;   ____________________________ REVIEW OF SYSTEMS General:  malaise and fatigue Eyes: dry eye syndrome Respiratory: cough Cardiovascular:  please review HPI Abdominal: denies dyspepsia, GI bleeding, constipation, or diarrhea Musculoskeletal:  Raynaud's syndrome, generalized aching, chronic low back pain, has had cortisone injections Psychiatric:  depression  ____________________________ PHYSICAL EXAMINATION VITAL SIGNS  Blood Pressure:  120/86 Sitting, Left arm, regular cuff  , 110/76 Standing, Left arm and regular cuff   Pulse:  68/min. Weight:  147.00 lbs. Height:  67.00"BMI: 23  Constitutional:  pleasant white female, in no acute distress Head:  normocephalic, normal hair pattern, no masses or tenderness ENT:  full mouth dentures present, ears, nose and throat unremarkable Neck:  supple, no masses, thyromegaly, JVD. Carotid pulses are full and equal bilaterally without bruits. Chest:  normal symmetry, clear to auscultation. Cardiac:  regular rhythm, normal S1 and S2, No S3 or S4, no murmurs, gallops or rubs detected. Peripheral Pulses:  the femoral,dorsalis pedis, and posterior tibial pulses are full and equal bilaterally with no bruits auscultated. Extremities & Back:  no deformities, clubbing, cyanosis, erythema or edema observed. Normal muscle strength and tone. Neurological:  no gross motor or sensory deficits noted, affect appropriate, oriented x3. ____________________________ MOST RECENT LIPID PANEL 12/30/17  CHOL TOTL 141 mg/dl, LDL 80 NM, HDL 46 mg/dl, TRIGLYCER 76 mg/dl, ALT 16 u/l, ALK PHOS 79 u/l, CHOL/HDL 3.1 (Calc) and AST 32  u/l ____________________________ IMPRESSIONS/PLAN  1. Autoimmune disease 2. History of atrial fibrillation but has not been anticoagulated in years currently on sotalol 3. History of CAD with myocardial bridging 4. Hypertensive heart disease 5. Cough and dyspnea of uncertain etiology she has a previously abnormal chest x-ray  Recommendations:  She did look all that ill but has always been difficult to evaluate. Obtain chest x-ray and lab as noted below. Followup in 6 months.  ____________________________ TODAYS ORDERS  1. Comprehensive Metabolic Panel: Today  2. Complete Blood Count: Today  3. BNP: Today  4. TSH: Today  5. Return Visit: 6 months  6. Chest X-ray PA/Lat: today  7. CHEST XRAY: Today                       ____________________________ Cardiology Physician:  Kerry Hough MD [facility]

## 2018-04-19 ENCOUNTER — Encounter: Payer: Self-pay | Admitting: Physician Assistant

## 2018-04-22 DIAGNOSIS — Z23 Encounter for immunization: Secondary | ICD-10-CM | POA: Diagnosis not present

## 2018-04-25 ENCOUNTER — Encounter: Payer: Self-pay | Admitting: Physician Assistant

## 2018-04-25 ENCOUNTER — Ambulatory Visit: Payer: PPO | Admitting: Physician Assistant

## 2018-04-25 VITALS — BP 107/69 | HR 68 | Resp 13 | Ht 65.5 in | Wt 148.2 lb

## 2018-04-25 DIAGNOSIS — M19071 Primary osteoarthritis, right ankle and foot: Secondary | ICD-10-CM

## 2018-04-25 DIAGNOSIS — M3501 Sicca syndrome with keratoconjunctivitis: Secondary | ICD-10-CM

## 2018-04-25 DIAGNOSIS — I7300 Raynaud's syndrome without gangrene: Secondary | ICD-10-CM | POA: Diagnosis not present

## 2018-04-25 DIAGNOSIS — M19042 Primary osteoarthritis, left hand: Secondary | ICD-10-CM

## 2018-04-25 DIAGNOSIS — Z8719 Personal history of other diseases of the digestive system: Secondary | ICD-10-CM

## 2018-04-25 DIAGNOSIS — Z79899 Other long term (current) drug therapy: Secondary | ICD-10-CM

## 2018-04-25 DIAGNOSIS — M19072 Primary osteoarthritis, left ankle and foot: Secondary | ICD-10-CM

## 2018-04-25 DIAGNOSIS — M533 Sacrococcygeal disorders, not elsewhere classified: Secondary | ICD-10-CM

## 2018-04-25 DIAGNOSIS — M8589 Other specified disorders of bone density and structure, multiple sites: Secondary | ICD-10-CM | POA: Diagnosis not present

## 2018-04-25 DIAGNOSIS — M170 Bilateral primary osteoarthritis of knee: Secondary | ICD-10-CM | POA: Diagnosis not present

## 2018-04-25 DIAGNOSIS — Z8614 Personal history of Methicillin resistant Staphylococcus aureus infection: Secondary | ICD-10-CM

## 2018-04-25 DIAGNOSIS — M19041 Primary osteoarthritis, right hand: Secondary | ICD-10-CM

## 2018-04-25 DIAGNOSIS — Z8639 Personal history of other endocrine, nutritional and metabolic disease: Secondary | ICD-10-CM | POA: Diagnosis not present

## 2018-04-25 DIAGNOSIS — M359 Systemic involvement of connective tissue, unspecified: Secondary | ICD-10-CM | POA: Diagnosis not present

## 2018-05-22 ENCOUNTER — Other Ambulatory Visit: Payer: Self-pay | Admitting: Cardiology

## 2018-05-22 NOTE — Telephone Encounter (Signed)
° ° °  1. Which medications need to be refilled? (please list name of each medication and dose if known) Losartan 100mg  tablet  2. Which pharmacy/location (including street and city if local pharmacy) is medication to be sent to? [facility] fax: [phone]  3. Do they need a 30 day or 90 day supply? [place]

## 2018-05-23 MED ORDER — LOSARTAN POTASSIUM 100 MG PO TABS: 100.0000 mg | ORAL_TABLET | Freq: Every day | ORAL | 5 refills | Status: DC

## 2018-05-23 NOTE — Telephone Encounter (Signed)
Refill for losartan sent to [facility] in [place] as requested. Patient is not due for follow up appointment until 09/2018.

## 2018-05-26 DIAGNOSIS — H353131 Nonexudative age-related macular degeneration, bilateral, early dry stage: Secondary | ICD-10-CM | POA: Diagnosis not present

## 2018-05-26 DIAGNOSIS — H35373 Puckering of macula, bilateral: Secondary | ICD-10-CM | POA: Diagnosis not present

## 2018-05-26 DIAGNOSIS — H35433 Paving stone degeneration of retina, bilateral: Secondary | ICD-10-CM | POA: Diagnosis not present

## 2018-05-26 DIAGNOSIS — Z79899 Other long term (current) drug therapy: Secondary | ICD-10-CM | POA: Diagnosis not present

## 2018-06-19 ENCOUNTER — Other Ambulatory Visit: Payer: Self-pay | Admitting: Cardiology

## 2018-06-19 NOTE — Telephone Encounter (Signed)
° °  1. Which medications need to be refilled? (please list name of each medication and dose if known) sotalol 160mg  tablet 1/2 tab BID  2. Which pharmacy/location (including street and city if local pharmacy) is medication to be sent to? [facility] [address] [place]  3. Do they need a 30 day or 90 day supply? [place]

## 2018-06-19 NOTE — Telephone Encounter (Signed)
Has been given to Dr. Agustin Cree to review.

## 2018-06-20 DIAGNOSIS — Z1389 Encounter for screening for other disorder: Secondary | ICD-10-CM | POA: Diagnosis not present

## 2018-06-20 DIAGNOSIS — E038 Other specified hypothyroidism: Secondary | ICD-10-CM | POA: Diagnosis not present

## 2018-06-20 DIAGNOSIS — R413 Other amnesia: Secondary | ICD-10-CM | POA: Diagnosis not present

## 2018-06-20 DIAGNOSIS — I480 Paroxysmal atrial fibrillation: Secondary | ICD-10-CM | POA: Diagnosis not present

## 2018-06-20 DIAGNOSIS — Z6824 Body mass index (BMI) 24.0-24.9, adult: Secondary | ICD-10-CM | POA: Diagnosis not present

## 2018-06-20 DIAGNOSIS — R2689 Other abnormalities of gait and mobility: Secondary | ICD-10-CM | POA: Diagnosis not present

## 2018-06-20 DIAGNOSIS — M3210 Systemic lupus erythematosus, organ or system involvement unspecified: Secondary | ICD-10-CM | POA: Diagnosis not present

## 2018-06-20 DIAGNOSIS — M5416 Radiculopathy, lumbar region: Secondary | ICD-10-CM | POA: Diagnosis not present

## 2018-06-20 DIAGNOSIS — D899 Disorder involving the immune mechanism, unspecified: Secondary | ICD-10-CM | POA: Diagnosis not present

## 2018-06-20 DIAGNOSIS — F325 Major depressive disorder, single episode, in full remission: Secondary | ICD-10-CM | POA: Diagnosis not present

## 2018-06-20 DIAGNOSIS — I1310 Hypertensive heart and chronic kidney disease without heart failure, with stage 1 through stage 4 chronic kidney disease, or unspecified chronic kidney disease: Secondary | ICD-10-CM | POA: Diagnosis not present

## 2018-06-20 MED ORDER — SOTALOL HCL 80 MG PO TABS: 80.0000 mg | ORAL_TABLET | Freq: Two times a day (BID) | ORAL | 0 refills | Status: DC

## 2018-06-20 NOTE — Telephone Encounter (Signed)
Sotolol 80 mg twice daily refilled per Dr. Agustin Cree. Verified with patient she is no longer taking Zithromax.

## 2018-07-03 ENCOUNTER — Other Ambulatory Visit: Payer: Self-pay | Admitting: Rheumatology

## 2018-07-03 NOTE — Telephone Encounter (Signed)
Last Visit: 04/25/18 Next Visit: 09/26/18 Labs: 04/19/18 sodium 130 all other labs normal  PLQ Eye Exam: 05/26/18 WNL   Okay to refill per Dr. Estanislado Pandy

## 2018-09-05 DIAGNOSIS — N3941 Urge incontinence: Secondary | ICD-10-CM | POA: Diagnosis not present

## 2018-09-05 DIAGNOSIS — E038 Other specified hypothyroidism: Secondary | ICD-10-CM | POA: Diagnosis not present

## 2018-09-05 DIAGNOSIS — E871 Hypo-osmolality and hyponatremia: Secondary | ICD-10-CM | POA: Diagnosis not present

## 2018-09-05 DIAGNOSIS — M3210 Systemic lupus erythematosus, organ or system involvement unspecified: Secondary | ICD-10-CM | POA: Diagnosis not present

## 2018-09-05 DIAGNOSIS — F325 Major depressive disorder, single episode, in full remission: Secondary | ICD-10-CM | POA: Diagnosis not present

## 2018-09-05 DIAGNOSIS — I2510 Atherosclerotic heart disease of native coronary artery without angina pectoris: Secondary | ICD-10-CM | POA: Diagnosis not present

## 2018-09-05 DIAGNOSIS — I480 Paroxysmal atrial fibrillation: Secondary | ICD-10-CM | POA: Diagnosis not present

## 2018-09-05 DIAGNOSIS — I1310 Hypertensive heart and chronic kidney disease without heart failure, with stage 1 through stage 4 chronic kidney disease, or unspecified chronic kidney disease: Secondary | ICD-10-CM | POA: Diagnosis not present

## 2018-09-05 DIAGNOSIS — Z1389 Encounter for screening for other disorder: Secondary | ICD-10-CM | POA: Diagnosis not present

## 2018-09-05 DIAGNOSIS — N183 Chronic kidney disease, stage 3 (moderate): Secondary | ICD-10-CM | POA: Diagnosis not present

## 2018-09-05 DIAGNOSIS — D899 Disorder involving the immune mechanism, unspecified: Secondary | ICD-10-CM | POA: Diagnosis not present

## 2018-09-05 DIAGNOSIS — J0100 Acute maxillary sinusitis, unspecified: Secondary | ICD-10-CM | POA: Diagnosis not present

## 2018-09-05 DIAGNOSIS — Z1339 Encounter for screening examination for other mental health and behavioral disorders: Secondary | ICD-10-CM | POA: Diagnosis not present

## 2018-09-12 NOTE — Progress Notes (Signed)
Office Visit Note  Patient: Alice Medina             Date of Birth: [birth year]           MRN: [id number]             PCP: Shon Baton, MD Referring: Shon Baton, MD Visit Date: 09/26/2018 Occupation: @GUAROCC @  Subjective:  Pain in hands, knees and feet.  Plaquenil 200 mg once daily.  Last Plaquenil eye exam normal on 05/26/2018 and will monitor yearly.  Most recent CBC/CMP within normal limits on 04/19/2018 and will monitor every 5 months.  History of Present Illness: Alice Medina is a 78 y.o. female history of autoimmune disease and osteoarthritis.  She states she has been experiencing increased pain in her bilateral hands, bilateral knee joints and her feet.  She describes discomfort in her DIP and CMC joints.  She believes that her knee joints are swollen.  She has had nasal congestion.  She states she has appointment with ENT coming up.  She continues to have dry mouth and dry eyes.  Her Raynolds is not as active.  Activities of Daily Living:  Patient reports morning stiffness for 15-30 minutes.   Patient Reports nocturnal pain.  Difficulty dressing/grooming: Denies Difficulty climbing stairs: Reports Difficulty getting out of chair: Reports Difficulty using hands for taps, buttons, cutlery, and/or writing: Reports  Review of Systems  Constitutional: Positive for fatigue. Negative for night sweats, weight gain and weight loss.  HENT: Positive for mouth sores and mouth dryness. Negative for trouble swallowing, trouble swallowing and nose dryness.        Nose sores  Eyes: Positive for dryness. Negative for pain, redness and visual disturbance.  Respiratory: Negative for cough, shortness of breath, wheezing and difficulty breathing.   Cardiovascular: Negative for chest pain, palpitations, hypertension, irregular heartbeat and swelling in legs/feet.  Gastrointestinal: Positive for constipation. Negative for blood in stool and diarrhea.  Endocrine: Negative for increased  urination.  Genitourinary: Negative for painful urination, pelvic pain and vaginal dryness.  Musculoskeletal: Positive for arthralgias, joint pain and morning stiffness. Negative for joint swelling, myalgias, muscle weakness, muscle tenderness and myalgias.  Skin: Positive for color change. Negative for rash, hair loss, redness, skin tightness, ulcers and sensitivity to sunlight.  Allergic/Immunologic: Negative for susceptible to infections.  Neurological: Negative for dizziness, light-headedness, headaches, memory loss, night sweats and weakness.  Hematological: Negative for swollen glands.  Psychiatric/Behavioral: Positive for sleep disturbance. Negative for depressed mood. The patient is not nervous/anxious.     PMFS History:  Patient Active Problem List   Diagnosis Date Noted  . Multiple closed fractures of metacarpal bone 07/25/2017  . Autoimmune disease ([place]) +ANA +Ro +La +RF oral ulcers nasal ulcers Raynaud photosensitivity SICCA  01/13/2017  . High risk medication use 01/13/2017  . Photosensitivity 01/13/2017  . Sjogren's syndrome with keratoconjunctivitis sicca ([place]) 01/13/2017  . Recurrent oral ulcers and nasal ulcers  01/13/2017  . Raynaud's disease without gangrene 01/13/2017  . Primary osteoarthritis of both hands 01/13/2017  . Primary osteoarthritis of both knees 01/13/2017  . Primary osteoarthritis of both feet 01/13/2017  . Osteopenia of multiple sites 01/13/2017  . History of hypertension 01/13/2017  . History of hypothyroidism 01/13/2017  . History of gastroesophageal reflux (GERD)/ PUD 01/13/2017  . History of MRSA infection 01/13/2017  . Diarrhea 05/28/2013  . Family history of malignant neoplasm of gastrointestinal tract 06/22/2011  . ADENOCARCINOMA, BREAST 05/02/2009  . CORONARY ARTERY DISEASE 05/02/2009  . DYSPHAGIA UNSPECIFIED 05/02/2009  Past Medical History:  Diagnosis Date  . Anxiety   . Arthritis   . Atrial fibrillation ([place])   . Autoimmune  disorder ([place])    SJORGREN'S  . Breast cancer ([place])    right breast  . Cardiac arrhythmia due to congenital heart disease   . Chronic headaches   . Depression   . GERD (gastroesophageal reflux disease)   . Hypertension   . Myocardial infarction ([place])    1990  . Osteoporosis   . Status post dilation of esophageal narrowing   . Thyroid disease   . Ulcer     Family History  Problem Relation Age of Onset  . Breast cancer Mother   . Lung cancer Mother   . Cancer Father        larynx  . Heart failure Father   . Heart disease Brother   . Colon cancer Brother   . Pseudochol deficiency Other   . Breast cancer Sister   . Cancer Sister        thyroid   . Breast cancer Maternal Grandmother   . Autoimmune disease Daughter   . Cancer Son        prostate   . Rheum arthritis Grandchild   . Esophageal cancer Neg Hx   . Rectal cancer Neg Hx    Past Surgical History:  Procedure Laterality Date  . APPENDECTOMY    . breast reconstuction     x 2  . CHOLECYSTECTOMY    . COLONOSCOPY    . double mastectomy    . PARTIAL HYSTERECTOMY  1990  . TIBIA FRACTURE SURGERY Right    with subsequent hardware removal  . UPPER GASTROINTESTINAL ENDOSCOPY    . WRIST FRACTURE SURGERY Left    Social History   Social History Narrative   1 cup of coffee daily    There is no immunization history on file for this patient.   Objective: Vital Signs: BP 102/68 (BP Location: Left Arm, Patient Position: Sitting, Cuff Size: Normal)   Pulse 73   Resp 13   Ht 5' 5.5" (1.664 m)   Wt 150 lb (68 kg)   BMI 24.58 kg/m    Physical Exam Vitals signs and nursing note reviewed.  Constitutional:      Appearance: She is well-developed.  HENT:     Head: Normocephalic and atraumatic.  Eyes:     Conjunctiva/sclera: Conjunctivae normal.  Neck:     Musculoskeletal: Normal range of motion.  Cardiovascular:     Rate and Rhythm: Normal rate and regular rhythm.     Heart sounds: Normal heart sounds.    Pulmonary:     Effort: Pulmonary effort is normal.     Breath sounds: Normal breath sounds.  Abdominal:     General: Bowel sounds are normal.     Palpations: Abdomen is soft.  Lymphadenopathy:     Cervical: No cervical adenopathy.  Skin:    General: Skin is warm and dry.     Capillary Refill: Capillary refill takes less than 2 seconds.  Neurological:     Mental Status: She is alert and oriented to person, place, and time.  Psychiatric:        Behavior: Behavior normal.      Musculoskeletal Exam: C-spine thoracic spine good range of motion.  She has discomfort range of motion of her lumbar spine.  Shoulder joints elbow joints with good range of motion.  She has bilateral CMC DIP and PIP thickening with some inflammatory change in  her left second PIP joint.  Hip joints were in good range of motion.  She appears to have some near valgus deformity in her knee joints without any warmth swelling or effusion.  She has some osteoarthritic changes in her feet which causes discomfort.  CDAI Exam: CDAI Score: Not documented Patient Global Assessment: Not documented; Provider Global Assessment: Not documented Swollen: Not documented; Tender: Not documented Joint Exam   Not documented   There is currently no information documented on the homunculus. Go to the Rheumatology activity and complete the homunculus joint exam.  Investigation: No additional findings.  Imaging: No results found.  Recent Labs: Lab Results  Component Value Date   WBC 3.0 (L) 12/13/2017   HGB 14.6 12/13/2017   PLT 133 (L) 12/13/2017   NA 126 (L) 12/13/2017   K 4.7 12/13/2017   CL 94 (L) 12/13/2017   CO2 22 12/13/2017   GLUCOSE 97 12/13/2017   BUN 18 12/13/2017   CREATININE 1.16 (H) 12/13/2017   BILITOT 0.8 12/13/2017   ALKPHOS 79 12/13/2017   AST 52 (H) 12/13/2017   ALT 22 12/13/2017   PROT 8.3 (H) 12/13/2017   ALBUMIN 3.6 12/13/2017   CALCIUM 8.9 12/13/2017   GFRAA 51 (L) 12/13/2017    Speciality  Comments: PLQ Eye Exam: 05/26/18 WNL @ [facility] [place] Follow up in 1 year  Procedures:  No procedures performed Allergies: Codeine; Robinul [glycopyrrolate]; and Sulfamethoxazole-trimethoprim   Assessment / Plan:     Visit Diagnoses: Autoimmune disease ([place]) +ANA +Ro +La +RF oral ulcers nasal ulcers Raynaud photosensitivity SICCA  -continues to have sicca symptoms and infrequent oral ulcers.  She also has some fatigue.  She states her symptoms are fairly well controlled with Plaquenil.  She did not have much problems with Raynaud's this winter.  Over-the-counter products for sicca symptoms were discussed.  Plan: Urinalysis, Routine w reflex microscopic, Rheumatoid factor, Serum protein electrophoresis with reflex  High risk medication use - PLQ 200 mg 1 tablet by mouth daily.  -She has been getting her eye exam regularly.  Plan: CBC with Differential/Platelet, COMPLETE METABOLIC PANEL WITH GFR  Sjogren's syndrome with keratoconjunctivitis sicca ([facility])-over-the-counter products were discussed.  Raynaud's disease without gangrene-the symptoms did not flare this winter.  Primary osteoarthritis of both hands-joint protection muscle strengthening was discussed.  Primary osteoarthritis of both knees-she has chronic discomfort in her knee joints.  She has some valgus deformities in her knee joints.  Use of knee bracing was discussed.  A handout on knee exercises was given.  Primary osteoarthritis of both feet-proper fitting shoes were discussed.  Lower back pain-patient has no radiculopathy.  Have given her a handout on back exercises.  Osteopenia of multiple sites-she is on calcium and vitamin D.  Other medical problems are listed as follows:   History of gastroesophageal reflux (GERD)/ PUD  History of MRSA infection  History of hypothyroidism  History of hypertension   Orders: Orders Placed This Encounter  Procedures  . CBC with Differential/Platelet  . COMPLETE  METABOLIC PANEL WITH GFR  . Urinalysis, Routine w reflex microscopic  . Rheumatoid factor  . Serum protein electrophoresis with reflex   No orders of the defined types were placed in this encounter.    Follow-Up Instructions: Return in about 5 months (around 02/26/2019) for Autoimmune disease, Osteoarthritis.   Bo Merino, MD  Note - This record has been created using Editor, commissioning.  Chart creation errors have been sought, but may not always  have been located.  Such creation errors do not reflect on  the standard of medical care.

## 2018-09-13 ENCOUNTER — Telehealth: Payer: Self-pay

## 2018-09-13 NOTE — Telephone Encounter (Signed)
NOTES ON FILE 

## 2018-09-14 ENCOUNTER — Other Ambulatory Visit: Payer: Self-pay | Admitting: Cardiology

## 2018-09-26 ENCOUNTER — Encounter: Payer: Self-pay | Admitting: Physician Assistant

## 2018-09-26 ENCOUNTER — Ambulatory Visit: Payer: PPO | Admitting: Rheumatology

## 2018-09-26 VITALS — BP 102/68 | HR 73 | Resp 13 | Ht 65.5 in | Wt 150.0 lb

## 2018-09-26 DIAGNOSIS — M545 Low back pain, unspecified: Secondary | ICD-10-CM

## 2018-09-26 DIAGNOSIS — Z8679 Personal history of other diseases of the circulatory system: Secondary | ICD-10-CM | POA: Diagnosis not present

## 2018-09-26 DIAGNOSIS — M359 Systemic involvement of connective tissue, unspecified: Secondary | ICD-10-CM

## 2018-09-26 DIAGNOSIS — Z8719 Personal history of other diseases of the digestive system: Secondary | ICD-10-CM

## 2018-09-26 DIAGNOSIS — Z79899 Other long term (current) drug therapy: Secondary | ICD-10-CM | POA: Diagnosis not present

## 2018-09-26 DIAGNOSIS — M19041 Primary osteoarthritis, right hand: Secondary | ICD-10-CM

## 2018-09-26 DIAGNOSIS — M8589 Other specified disorders of bone density and structure, multiple sites: Secondary | ICD-10-CM

## 2018-09-26 DIAGNOSIS — Z8639 Personal history of other endocrine, nutritional and metabolic disease: Secondary | ICD-10-CM | POA: Diagnosis not present

## 2018-09-26 DIAGNOSIS — M19042 Primary osteoarthritis, left hand: Secondary | ICD-10-CM

## 2018-09-26 DIAGNOSIS — I7300 Raynaud's syndrome without gangrene: Secondary | ICD-10-CM | POA: Diagnosis not present

## 2018-09-26 DIAGNOSIS — M170 Bilateral primary osteoarthritis of knee: Secondary | ICD-10-CM | POA: Diagnosis not present

## 2018-09-26 DIAGNOSIS — M19071 Primary osteoarthritis, right ankle and foot: Secondary | ICD-10-CM | POA: Diagnosis not present

## 2018-09-26 DIAGNOSIS — M19072 Primary osteoarthritis, left ankle and foot: Secondary | ICD-10-CM

## 2018-09-26 DIAGNOSIS — M3501 Sicca syndrome with keratoconjunctivitis: Secondary | ICD-10-CM

## 2018-09-26 DIAGNOSIS — Z8614 Personal history of Methicillin resistant Staphylococcus aureus infection: Secondary | ICD-10-CM

## 2018-09-26 DIAGNOSIS — G8929 Other chronic pain: Secondary | ICD-10-CM

## 2018-09-26 NOTE — Patient Instructions (Signed)
Knee Exercises Ask your health care provider which exercises are safe for you. Do exercises exactly as told by your health care provider and adjust them as directed. It is normal to feel mild stretching, pulling, tightness, or discomfort as you do these exercises, but you should stop right away if you feel sudden pain or your pain gets worse.Do not begin these exercises until told by your health care provider. STRETCHING AND RANGE OF MOTION EXERCISES These exercises warm up your muscles and joints and improve the movement and flexibility of your knee. These exercises also help to relieve pain, numbness, and tingling. Exercise A: Knee Extension, Prone 1. Lie on your abdomen on a bed. 2. Place your left / right knee just beyond the edge of the surface so your knee is not on the bed. You can put a towel under your left / right thigh just above your knee for comfort. 3. Relax your leg muscles and allow gravity to straighten your knee. You should feel a stretch behind your left / right knee. 4. Hold this position for __________ seconds. 5. Scoot up so your knee is supported between repetitions. Repeat __________ times. Complete this stretch __________ times a day. Exercise B: Knee Flexion, Active  1. Lie on your back with both knees straight. If this causes back discomfort, bend your left / right knee so your foot is flat on the floor. 2. Slowly slide your left / right heel back toward your buttocks until you feel a gentle stretch in the front of your knee or thigh. 3. Hold this position for __________ seconds. 4. Slowly slide your left / right heel back to the starting position. Repeat __________ times. Complete this exercise __________ times a day. Exercise C: Quadriceps, Prone  1. Lie on your abdomen on a firm surface, such as a bed or padded floor. 2. Bend your left / right knee and hold your ankle. If you cannot reach your ankle or pant leg, loop a belt around your foot and grab the belt  instead. 3. Gently pull your heel toward your buttocks. Your knee should not slide out to the side. You should feel a stretch in the front of your thigh and knee. 4. Hold this position for __________ seconds. Repeat __________ times. Complete this stretch __________ times a day. Exercise D: Hamstring, Supine 1. Lie on your back. 2. Loop a belt or towel over the ball of your left / right foot. The ball of your foot is on the walking surface, right under your toes. 3. Straighten your left / right knee and slowly pull on the belt to raise your leg until you feel a gentle stretch behind your knee. ? Do not let your left / right knee bend while you do this. ? Keep your other leg flat on the floor. 4. Hold this position for __________ seconds. Repeat __________ times. Complete this stretch __________ times a day. STRENGTHENING EXERCISES These exercises build strength and endurance in your knee. Endurance is the ability to use your muscles for a long time, even after they get tired. Exercise E: Quadriceps, Isometric  1. Lie on your back with your left / right leg extended and your other knee bent. Put a rolled towel or small pillow under your knee if told by your health care provider. 2. Slowly tense the muscles in the front of your left / right thigh. You should see your kneecap slide up toward your hip or see increased dimpling just above the knee. This   motion will push the back of the knee toward the floor. 3. For __________ seconds, keep the muscle as tight as you can without increasing your pain. 4. Relax the muscles slowly and completely. Repeat __________ times. Complete this exercise __________ times a day. Exercise F: Straight Leg Raises - Quadriceps 1. Lie on your back with your left / right leg extended and your other knee bent. 2. Tense the muscles in the front of your left / right thigh. You should see your kneecap slide up or see increased dimpling just above the knee. Your thigh may  even shake a bit. 3. Keep these muscles tight as you raise your leg 4-6 inches (10-15 cm) off the floor. Do not let your knee bend. 4. Hold this position for __________ seconds. 5. Keep these muscles tense as you lower your leg. 6. Relax your muscles slowly and completely after each repetition. Repeat __________ times. Complete this exercise __________ times a day. Exercise G: Hamstring, Isometric 1. Lie on your back on a firm surface. 2. Bend your left / right knee approximately __________ degrees. 3. Dig your left / right heel into the surface as if you are trying to pull it toward your buttocks. Tighten the muscles in the back of your thighs to dig as hard as you can without increasing any pain. 4. Hold this position for __________ seconds. 5. Release the tension gradually and allow your muscles to relax completely for __________ seconds after each repetition. Repeat __________ times. Complete this exercise __________ times a day. Exercise H: Hamstring Curls  If told by your health care provider, do this exercise while wearing ankle weights. Begin with __________ weights. Then increase the weight by 1 lb (0.5 kg) increments. Do not wear ankle weights that are more than __________. 1. Lie on your abdomen with your legs straight. 2. Bend your left / right knee as far as you can without feeling pain. Keep your hips flat against the floor. 3. Hold this position for __________ seconds. 4. Slowly lower your leg to the starting position.  Repeat __________ times. Complete this exercise __________ times a day. Exercise I: Squats (Quadriceps) 1. Stand in front of a table, with your feet and knees pointing straight ahead. You may rest your hands on the table for balance but not for support. 2. Slowly bend your knees and lower your hips like you are going to sit in a chair. ? Keep your weight over your heels, not over your toes. ? Keep your lower legs upright so they are parallel with the table  legs. ? Do not let your hips go lower than your knees. ? Do not bend lower than told by your health care provider. ? If your knee pain increases, do not bend as low. 3. Hold the squat position for __________ seconds. 4. Slowly push with your legs to return to standing. Do not use your hands to pull yourself to standing. Repeat __________ times. Complete this exercise __________ times a day. Exercise J: Wall Slides (Quadriceps)  1. Lean your back against a smooth wall or door while you walk your feet out 18-24 inches (46-61 cm) from it. 2. Place your feet hip-width apart. 3. Slowly slide down the wall or door until your knees bend __________ degrees. Keep your knees over your heels, not over your toes. Keep your knees in line with your hips. 4. Hold for __________ seconds. Repeat __________ times. Complete this exercise __________ times a day. Exercise K: Straight Leg Raises -   Hip Abductors 1. Lie on your side with your left / right leg in the top position. Lie so your head, shoulder, knee, and hip line up. You may bend your bottom knee to help you keep your balance. 2. Roll your hips slightly forward so your hips are stacked directly over each other and your left / right knee is facing forward. 3. Leading with your heel, lift your top leg 4-6 inches (10-15 cm). You should feel the muscles in your outer hip lifting. ? Do not let your foot drift forward. ? Do not let your knee roll toward the ceiling. 4. Hold this position for __________ seconds. 5. Slowly return your leg to the starting position. 6. Let your muscles relax completely after each repetition. Repeat __________ times. Complete this exercise __________ times a day. Exercise L: Straight Leg Raises - Hip Extensors 1. Lie on your abdomen on a firm surface. You can put a pillow under your hips if that is more comfortable. 2. Tense the muscles in your buttocks and lift your left / right leg about 4-6 inches (10-15 cm). Keep your knee  straight as you lift your leg. 3. Hold this position for __________ seconds. 4. Slowly lower your leg to the starting position. 5. Let your leg relax completely after each repetition. Repeat __________ times. Complete this exercise __________ times a day. This information is not intended to replace advice given to you by your health care provider. Make sure you discuss any questions you have with your health care provider. Document Released: 05/26/2005 Document Revised: 04/05/2016 Document Reviewed: 05/18/2015 Elsevier Interactive Patient Education  2018 [employer] Back Exercises The following exercises strengthen the muscles that help to support the back. They also help to keep the lower back flexible. Doing these exercises can help to prevent back pain or lessen existing pain. If you have back pain or discomfort, try doing these exercises 2-3 times each day or as told by your health care provider. When the pain goes away, do them once each day, but increase the number of times that you repeat the steps for each exercise (do more repetitions). If you do not have back pain or discomfort, do these exercises once each day or as told by your health care provider. Exercises Single Knee to Chest Repeat these steps 3-5 times for each leg: 1. Lie on your back on a firm bed or the floor with your legs extended. 2. Bring one knee to your chest. Your other leg should stay extended and in contact with the floor. 3. Hold your knee in place by grabbing your knee or thigh. 4. Pull on your knee until you feel a gentle stretch in your lower back. 5. Hold the stretch for 10-30 seconds. 6. Slowly release and straighten your leg. Pelvic Tilt Repeat these steps 5-10 times: 1. Lie on your back on a firm bed or the floor with your legs extended. 2. Bend your knees so they are pointing toward the ceiling and your feet are flat on the floor. 3. Tighten your lower abdominal muscles to press your lower back  against the floor. This motion will tilt your pelvis so your tailbone points up toward the ceiling instead of pointing to your feet or the floor. 4. With gentle tension and even breathing, hold this position for 5-10 seconds. Cat-Cow Repeat these steps until your lower back becomes more flexible: 1. Get into a hands-and-knees position on a firm surface. Keep your hands under your shoulders, and keep your   knees under your hips. You may place padding under your knees for comfort. 2. Let your head hang down, and point your tailbone toward the floor so your lower back becomes rounded like the back of a cat. 3. Hold this position for 5 seconds. 4. Slowly lift your head and point your tailbone up toward the ceiling so your back forms a sagging arch like the back of a cow. 5. Hold this position for 5 seconds.  Press-Ups Repeat these steps 5-10 times: 1. Lie on your abdomen (face-down) on the floor. 2. Place your palms near your head, about shoulder-width apart. 3. While you keep your back as relaxed as possible and keep your hips on the floor, slowly straighten your arms to raise the top half of your body and lift your shoulders. Do not use your back muscles to raise your upper torso. You may adjust the placement of your hands to make yourself more comfortable. 4. Hold this position for 5 seconds while you keep your back relaxed. 5. Slowly return to lying flat on the floor.  Bridges Repeat these steps 10 times: 1. Lie on your back on a firm surface. 2. Bend your knees so they are pointing toward the ceiling and your feet are flat on the floor. 3. Tighten your buttocks muscles and lift your buttocks off of the floor until your waist is at almost the same height as your knees. You should feel the muscles working in your buttocks and the back of your thighs. If you do not feel these muscles, slide your feet 1-2 inches farther away from your buttocks. 4. Hold this position for 3-5 seconds. 5. Slowly  lower your hips to the starting position, and allow your buttocks muscles to relax completely. If this exercise is too easy, try doing it with your arms crossed over your chest. Abdominal Crunches Repeat these steps 5-10 times: 1. Lie on your back on a firm bed or the floor with your legs extended. 2. Bend your knees so they are pointing toward the ceiling and your feet are flat on the floor. 3. Cross your arms over your chest. 4. Tip your chin slightly toward your chest without bending your neck. 5. Tighten your abdominal muscles and slowly raise your trunk (torso) high enough to lift your shoulder blades a tiny bit off of the floor. Avoid raising your torso higher than that, because it can put too much stress on your low back and it does not help to strengthen your abdominal muscles. 6. Slowly return to your starting position. Back Lifts Repeat these steps 5-10 times: 1. Lie on your abdomen (face-down) with your arms at your sides, and rest your forehead on the floor. 2. Tighten the muscles in your legs and your buttocks. 3. Slowly lift your chest off of the floor while you keep your hips pressed to the floor. Keep the back of your head in line with the curve in your back. Your eyes should be looking at the floor. 4. Hold this position for 3-5 seconds. 5. Slowly return to your starting position. Contact a health care provider if:  Your back pain or discomfort gets much worse when you do an exercise.  Your back pain or discomfort does not lessen within 2 hours after you exercise. If you have any of these problems, stop doing these exercises right away. Do not do them again unless your health care provider says that you can. Get help right away if:  You develop sudden, severe back   pain. If this happens, stop doing the exercises right away. Do not do them again unless your health care provider says that you can. This information is not intended to replace advice given to you by your health  care provider. Make sure you discuss any questions you have with your health care provider. Document Released: 08/19/2004 Document Revised: 11/15/2017 Document Reviewed: 09/05/2014 Elsevier Interactive Patient Education  2019 [employer]  

## 2018-09-28 DIAGNOSIS — R05 Cough: Secondary | ICD-10-CM | POA: Diagnosis not present

## 2018-10-01 ENCOUNTER — Other Ambulatory Visit: Payer: Self-pay | Admitting: Rheumatology

## 2018-10-02 NOTE — Telephone Encounter (Signed)
Last Visit: 09/26/18 Next visit: 02/27/19 Labs: 09/26/18 Glucose mildly low PLQ Eye Exam: 05/26/18 WNL  Okay to refill per Dr. Deveshwar  

## 2018-10-03 ENCOUNTER — Telehealth: Payer: Self-pay | Admitting: Rheumatology

## 2018-10-03 LAB — PROTEIN ELECTROPHORESIS, SERUM, WITH REFLEX
Albumin ELP: 3.8 g/dL (ref 3.8–4.8)
Alpha 1: 0.3 g/dL (ref 0.2–0.3)
Alpha 2: 0.7 g/dL (ref 0.5–0.9)
Beta 2: 0.6 g/dL — ABNORMAL HIGH (ref 0.2–0.5)
Beta Globulin: 0.5 g/dL (ref 0.4–0.6)
Gamma Globulin: 1.7 g/dL (ref 0.8–1.7)
TOTAL PROTEIN: 7.7 g/dL (ref 6.1–8.1)

## 2018-10-03 LAB — CBC WITH DIFFERENTIAL/PLATELET
ABSOLUTE MONOCYTES: 456 {cells}/uL (ref 200–950)
BASOS PCT: 0.5 %
Basophils Absolute: 19 cells/uL (ref 0–200)
EOS ABS: 129 {cells}/uL (ref 15–500)
Eosinophils Relative: 3.4 %
HEMATOCRIT: 41.6 % (ref 35.0–45.0)
HEMOGLOBIN: 14.1 g/dL (ref 11.7–15.5)
LYMPHS ABS: 1452 {cells}/uL (ref 850–3900)
MCH: 31.2 pg (ref 27.0–33.0)
MCHC: 33.9 g/dL (ref 32.0–36.0)
MCV: 92 fL (ref 80.0–100.0)
MPV: 10.6 fL (ref 7.5–12.5)
Monocytes Relative: 12 %
Neutro Abs: 1744 cells/uL (ref 1500–7800)
Neutrophils Relative %: 45.9 %
PLATELETS: 213 10*3/uL (ref 140–400)
RBC: 4.52 10*6/uL (ref 3.80–5.10)
RDW: 12.1 % (ref 11.0–15.0)
TOTAL LYMPHOCYTE: 38.2 %
WBC: 3.8 10*3/uL (ref 3.8–10.8)

## 2018-10-03 LAB — COMPLETE METABOLIC PANEL WITH GFR
AG RATIO: 1 (calc) (ref 1.0–2.5)
ALKALINE PHOSPHATASE (APISO): 71 U/L (ref 37–153)
ALT: 14 U/L (ref 6–29)
AST: 30 U/L (ref 10–35)
Albumin: 3.8 g/dL (ref 3.6–5.1)
BUN: 21 mg/dL (ref 7–25)
CHLORIDE: 103 mmol/L (ref 98–110)
CO2: 28 mmol/L (ref 20–32)
Calcium: 10.3 mg/dL (ref 8.6–10.4)
Creat: 0.92 mg/dL (ref 0.60–0.93)
GFR, EST NON AFRICAN AMERICAN: 60 mL/min/{1.73_m2} (ref >=60)
GFR, Est African American: 69 mL/min/{1.73_m2} (ref >=60)
GLOBULIN: 3.7 g/dL (ref 1.9–3.7)
Glucose, Bld: 57 mg/dL — ABNORMAL LOW (ref 65–99)
POTASSIUM: 4.1 mmol/L (ref 3.5–5.3)
SODIUM: 138 mmol/L (ref 135–146)
Total Bilirubin: 0.4 mg/dL (ref 0.2–1.2)
Total Protein: 7.5 g/dL (ref 6.1–8.1)

## 2018-10-03 LAB — URINALYSIS, ROUTINE W REFLEX MICROSCOPIC
BILIRUBIN URINE: NEGATIVE
Bacteria, UA: NONE SEEN /HPF
Glucose, UA: NEGATIVE
Hgb urine dipstick: NEGATIVE
Hyaline Cast: NONE SEEN /LPF
Ketones, ur: NEGATIVE
NITRITE: NEGATIVE
PH: 6 (ref 5.0–8.0)
Protein, ur: NEGATIVE
RBC / HPF: NONE SEEN /HPF (ref 0–2)
SPECIFIC GRAVITY, URINE: 1.01 (ref 1.001–1.03)
SQUAMOUS EPITHELIAL / LPF: NONE SEEN /HPF (ref <=5)

## 2018-10-03 LAB — IFE INTERPRETATION: Immunofix Electr Int: NOT DETECTED

## 2018-10-03 LAB — RHEUMATOID FACTOR: Rheumatoid fact SerPl-aCnc: 18 IU/mL — ABNORMAL HIGH (ref <14)

## 2018-10-03 NOTE — Progress Notes (Signed)
Labs are stable.  No change in therapy needed.

## 2018-10-03 NOTE — Telephone Encounter (Signed)
Patient advised labs are stable. No change in therapy needed.

## 2018-10-03 NOTE — Telephone Encounter (Signed)
Patient called requesting a return call with her labwork results. 

## 2018-10-10 ENCOUNTER — Other Ambulatory Visit: Payer: Self-pay | Admitting: Cardiology

## 2018-10-10 NOTE — Telephone Encounter (Signed)
Pt is scheduled with Dr Marlou Porch never been seen in [place]

## 2018-10-10 NOTE — Telephone Encounter (Signed)
°*  STAT* If patient is at the pharmacy, call can be transferred to refill team.   1. Which medications need to be refilled? (please list name of each medication and dose if known) Spironolactone 25mg  once daily  2. Which pharmacy/location (including street and city if local pharmacy) is medication to be sent to?[facility] on [address]  3. Do they need a 30 day or 90 day supply? [place]

## 2018-10-12 ENCOUNTER — Telehealth: Payer: Self-pay

## 2018-10-12 NOTE — Telephone Encounter (Signed)
° °  Patient was scheduled to see Dr Marlou Porch (former Dr Wynonia Lawman patient), however due to [phone] appointment delayed. She will be out of medication by this weekend.  Is it possible for Dr Marlou Porch to authorize refill? Please advise

## 2018-10-12 NOTE — Telephone Encounter (Signed)
LMTCB 3/19 Need to postpone appt with skains d/t COVID

## 2018-10-12 NOTE — Telephone Encounter (Signed)
Patient called and was rescheduled

## 2018-10-17 MED ORDER — SPIRONOLACTONE 25 MG PO TABS: 25.0000 mg | ORAL_TABLET | Freq: Every day | ORAL | 2 refills | Status: DC

## 2018-10-19 ENCOUNTER — Ambulatory Visit: Payer: PPO | Admitting: Cardiology

## 2018-10-21 ENCOUNTER — Other Ambulatory Visit: Payer: Self-pay | Admitting: Cardiology

## 2018-11-17 ENCOUNTER — Other Ambulatory Visit: Payer: Self-pay | Admitting: Cardiology

## 2018-11-17 NOTE — Telephone Encounter (Signed)
°*  STAT* If patient is at the pharmacy, call can be transferred to refill team.   1. Which medications need to be refilled? (please list name of each medication and dose if known)losartan (COZAAR) 100 MG tablet   2. Which pharmacy/location (including street and city if local pharmacy) is medication to be sent to?  [facility], [address] AT [address] [phone] (Phone) [phone] (Fax)    3. Do they need a 30 day or 90 day supply? 90 day

## 2018-11-20 MED ORDER — LOSARTAN POTASSIUM 100 MG PO TABS: 100.0000 mg | ORAL_TABLET | Freq: Every day | ORAL | 0 refills | Status: DC

## 2018-11-20 NOTE — Telephone Encounter (Signed)
Losartan 100mg  daily refilled per Dr. Agustin Cree

## 2018-11-20 NOTE — Telephone Encounter (Signed)
Pt requesting 90 day supply of Cozaar 100mg  1 daily for 90 day supply. Dr Agustin Cree to review and will follow up after.

## 2018-12-12 ENCOUNTER — Other Ambulatory Visit: Payer: Self-pay

## 2018-12-12 ENCOUNTER — Ambulatory Visit: Payer: PPO | Admitting: Cardiology

## 2018-12-12 ENCOUNTER — Encounter: Payer: Self-pay | Admitting: Cardiology

## 2018-12-12 VITALS — BP 134/84 | HR 57 | Ht 65.5 in | Wt 151.2 lb

## 2018-12-12 DIAGNOSIS — M3509 Sicca syndrome with other organ involvement: Secondary | ICD-10-CM | POA: Diagnosis not present

## 2018-12-12 DIAGNOSIS — I2510 Atherosclerotic heart disease of native coronary artery without angina pectoris: Secondary | ICD-10-CM

## 2018-12-12 DIAGNOSIS — I480 Paroxysmal atrial fibrillation: Secondary | ICD-10-CM

## 2018-12-12 MED ORDER — SPIRONOLACTONE 25 MG PO TABS: 25.0000 mg | ORAL_TABLET | Freq: Every day | ORAL | 3 refills | Status: DC

## 2018-12-12 MED ORDER — SOTALOL HCL 80 MG PO TABS: ORAL_TABLET | ORAL | 3 refills | Status: DC

## 2018-12-12 MED ORDER — LOSARTAN POTASSIUM 100 MG PO TABS: 100.0000 mg | ORAL_TABLET | Freq: Every day | ORAL | 3 refills | Status: DC

## 2018-12-12 NOTE — Progress Notes (Signed)
Cardiology Office Note:    Date:  12/12/2018   ID:  Alice Medina, DOB 11-Feb-1941, MRN [id number]  PCP:  Shon Baton, MD  Cardiologist:  Candee Furbish, MD  Electrophysiologist:  None   Referring MD: Shon Baton, MD   Follow up CAD prior patient of Dr. Wynonia Medina  History of Present Illness:    Alice Medina is a 78 y.o. female with paroxysmal atrial fibrillation CAD lupus hyperlipidemia hypertension previously followed by Dr. Wynonia Medina here for follow-up.  In review of her last note from Dr. Wynonia Medina on 04/17/2018 she had fairly persistent cough, had gone to the emergency room with hypotension and cough is fever.  Treated with plaque in L for her autoimmune condition is including Sjogren's syndrome.  Tick bites followed by rheumatologist.  No angina.  No recurrence of A. fib. Never had been on warfarin.  CAD has been stable.  Takes sotalol, antiarrhythmic for her A. Fib.  Brother has coronary artery disease.  CAD with myocardial bridging.  Non smoker. Father died with CHF and cancer. Son had prostate cancer.   Past Medical History:  Diagnosis Date  . ADENOCARCINOMA, BREAST 05/02/2009   Qualifier: Diagnosis of  By: Deatra Ina MD, Sandy Salaam   . Anxiety   . Arthritis   . Atrial fibrillation ([place])   . Autoimmune disorder ([place])    SJORGREN'S  . Breast cancer ([place])    right breast  . Cardiac arrhythmia due to congenital heart disease   . Chronic headaches   . CORONARY ARTERY DISEASE 05/02/2009   Qualifier: Diagnosis of  By: Deatra Ina MD, Sandy Salaam   . Depression   . Diarrhea 05/28/2013  . DYSPHAGIA UNSPECIFIED 05/02/2009   Qualifier: Diagnosis of  By: Deatra Ina MD, Sandy Salaam   . Family history of malignant neoplasm of gastrointestinal tract 06/22/2011   Brother with colon cancer   . GERD (gastroesophageal reflux disease)   . Hemorrhage of rectum and anus 06/22/2011  . History of hypothyroidism 01/13/2017  . History of MRSA infection 01/13/2017  . Hypertension   . Multiple closed fractures of  metacarpal bone 07/25/2017  . Myocardial infarction ([place])    1990  . Osteopenia of multiple sites 01/13/2017  . Osteoporosis   . Photosensitivity 01/13/2017  . Primary osteoarthritis of both hands 01/13/2017  . Raynaud's disease without gangrene 01/13/2017  . Recurrent oral ulcers and nasal ulcers  01/13/2017  . Sjogren's syndrome with keratoconjunctivitis sicca ([place]) 01/13/2017  . Status post dilation of esophageal narrowing   . Thyroid disease   . Ulcer     Past Surgical History:  Procedure Laterality Date  . APPENDECTOMY    . breast reconstuction     x 2  . CHOLECYSTECTOMY    . COLONOSCOPY    . double mastectomy    . PARTIAL HYSTERECTOMY  1990  . TIBIA FRACTURE SURGERY Right    with subsequent hardware removal  . UPPER GASTROINTESTINAL ENDOSCOPY    . WRIST FRACTURE SURGERY Left     Current Medications: No outpatient medications have been marked as taking for the 12/12/18 encounter (Office Visit) with Jerline Pain, MD.     Allergies:   Codeine; Robinul [glycopyrrolate]; and Sulfamethoxazole-trimethoprim   Social History   Socioeconomic History  . Marital status: Widowed    Spouse name: Not on file  . Number of children: 2  . Years of education: Not on file  . Highest education level: Not on file  Occupational History  . Occupation: retired Corporate treasurer  Social Needs  .  Financial resource strain: Not on file  . Food insecurity:    Worry: Not on file    Inability: Not on file  . Transportation needs:    Medical: Not on file    Non-medical: Not on file  Tobacco Use  . Smoking status: Never Smoker  . Smokeless tobacco: Never Used  Substance and Sexual Activity  . Alcohol use: Yes    Comment: very rare  . Drug use: Never  . Sexual activity: Not on file  Lifestyle  . Physical activity:    Days per week: Not on file    Minutes per session: Not on file  . Stress: Not on file  Relationships  . Social connections:    Talks on phone: Not on file    Gets together: Not on  file    Attends religious service: Not on file    Active member of club or organization: Not on file    Attends meetings of clubs or organizations: Not on file    Relationship status: Not on file  Other Topics Concern  . Not on file  Social History Narrative   1 cup of coffee daily     Family History: The patient's family history includes Autoimmune disease in her daughter; Breast cancer in her maternal grandmother, mother, and sister; Cancer in her father, sister, and son; Colon cancer in her brother; Heart disease in her brother; Heart failure in her father; Lung cancer in her mother; Pseudochol deficiency in an other family member; Rheum arthritis in her grandchild. There is no history of Esophageal cancer or Rectal cancer.  ROS:   Please see the history of present illness.    No fevers chills nausea vomiting syncope bleeding all other systems reviewed and are negative.  EKGs/Labs/Other Studies Reviewed:    The following studies were reviewed today: Prior Dr. Wynonia Medina notes reviewed  EKG:  EKG is  ordered today.  The ekg ordered today demonstrates sinus rhythm with mild first-degree AV block and QTC of 424 ms prior EKG 12/13/2017-first-degree AV block sinus rhythm nonspecific T wave changes.  Recent Labs: 09/26/2018: ALT 14; BUN 21; Creat 0.92; Hemoglobin 14.1; Platelets 213; Potassium 4.1; Sodium 138  Recent Lipid Panel No results found for: CHOL, TRIG, HDL, CHOLHDL, VLDL, LDLCALC, LDLDIRECT  Physical Exam:    VS:  BP 134/84   Pulse (!) 57   Ht 5' 5.5" (1.664 m)   Wt 151 lb 3.2 oz (68.6 kg)   BMI 24.78 kg/m     Wt Readings from Last 3 Encounters:  12/12/18 151 lb 3.2 oz (68.6 kg)  09/26/18 150 lb (68 kg)  04/25/18 148 lb 3.2 oz (67.2 kg)     GEN:  Well nourished, well developed in no acute distress HEENT: Normal NECK: No JVD; No carotid bruits LYMPHATICS: No lymphadenopathy CARDIAC: RRR, no murmurs, rubs, gallops RESPIRATORY:  Clear to auscultation without rales,  wheezing or rhonchi  ABDOMEN: Soft, non-tender, non-distended MUSCULOSKELETAL:  No edema; No deformity  SKIN: Warm and dry NEUROLOGIC:  Alert and oriented x 3 PSYCHIATRIC:  Normal affect   ASSESSMENT:    1. PAF (paroxysmal atrial fibrillation) ([place])   2. Coronary artery disease involving native coronary artery of native heart without angina pectoris   3. Sjogren's syndrome with other organ involvement ([place])    PLAN:    In order of problems listed above:  Atrial fibrillation paroxysmal - Sotalol 80mg  BID antiarrhythmic.  Stable.  Has not been anticoagulated for years per prior Dr. Wynonia Medina  note.  She had previously been on 160 several years ago but this was reduced after her bradycardia episode.  History of CAD with myocardial bridging -Stable.  Cardiac catheterization- 10/29/1988 - Lexiscan 2013 no ischemia.   HTN  - 99/69 yesterday, little dizzy. But most day does well. Na last 138. Had a fall a while back.   LE edema  - minor at times. Stockings.    Medication Adjustments/Labs and Tests Ordered: Current medicines are reviewed at length with the patient today.  Concerns regarding medicines are outlined above.  Orders Placed This Encounter  Procedures  . EKG 12-Lead   Meds ordered this encounter  Medications  . losartan (COZAAR) 100 MG tablet    Sig: Take 1 tablet (100 mg total) by mouth daily.    Dispense:  90 tablet    Refill:  3  . sotalol (BETAPACE) 80 MG tablet    Sig: TAKE 1 TABLET(80 MG) BY MOUTH TWICE DAILY    Dispense:  180 tablet    Refill:  3  . spironolactone (ALDACTONE) 25 MG tablet    Sig: Take 1 tablet (25 mg total) by mouth daily.    Dispense:  90 tablet    Refill:  3    Patient Instructions  Medication Instructions:  The current medical regimen is effective;  continue present plan and medications.  If you need a refill on your cardiac medications before your next appointment, please call your pharmacy.   Follow-Up: At [facility], you and  your health needs are our priority.  As part of our continuing mission to provide you with exceptional heart care, we have created designated Provider Care Teams.  These Care Teams include your primary Cardiologist (physician) and Advanced Practice Providers (APPs -  Physician Assistants and Nurse Practitioners) who all work together to provide you with the care you need, when you need it. You will need a follow up appointment in 6 months with Truitt Merle, NP and 1 year with Dr Marlou Porch.  Please call our office 2 months in advance to schedule this appointment.  You may see Candee Furbish, MD or one of the following Advanced Practice Providers on your designated Care Team:   Truitt Merle, NP Cecilie Kicks, NP . Kathyrn Drown, NP  Thank you for choosing [facility]!!        Signed, Candee Furbish, MD  12/12/2018 3:39 PM    [place]

## 2018-12-12 NOTE — Patient Instructions (Signed)
Medication Instructions:  The current medical regimen is effective;  continue present plan and medications.  If you need a refill on your cardiac medications before your next appointment, please call your pharmacy.   Follow-Up: At [facility], you and your health needs are our priority.  As part of our continuing mission to provide you with exceptional heart care, we have created designated Provider Care Teams.  These Care Teams include your primary Cardiologist (physician) and Advanced Practice Providers (APPs -  Physician Assistants and Nurse Practitioners) who all work together to provide you with the care you need, when you need it. You will need a follow up appointment in 6 months with Truitt Merle, NP and 1 year with Dr Marlou Porch.  Please call our office 2 months in advance to schedule this appointment.  You may see Candee Furbish, MD or one of the following Advanced Practice Providers on your designated Care Team:   Truitt Merle, NP Cecilie Kicks, NP . Kathyrn Drown, NP  Thank you for choosing [facility]!!

## 2018-12-14 DIAGNOSIS — Z6825 Body mass index (BMI) 25.0-25.9, adult: Secondary | ICD-10-CM | POA: Diagnosis not present

## 2018-12-14 DIAGNOSIS — M816 Localized osteoporosis [Lequesne]: Secondary | ICD-10-CM | POA: Diagnosis not present

## 2018-12-14 DIAGNOSIS — N958 Other specified menopausal and perimenopausal disorders: Secondary | ICD-10-CM | POA: Diagnosis not present

## 2018-12-14 DIAGNOSIS — Z01419 Encounter for gynecological examination (general) (routine) without abnormal findings: Secondary | ICD-10-CM | POA: Diagnosis not present

## 2018-12-26 ENCOUNTER — Other Ambulatory Visit: Payer: Self-pay | Admitting: Rheumatology

## 2018-12-27 NOTE — Telephone Encounter (Signed)
Last Visit: 09/26/18 Next visit: 02/27/19 Labs: 09/26/18 Glucose mildly low PLQ Eye Exam: 05/26/18 WNL  Okay to refill per Dr. Estanislado Pandy

## 2019-01-09 DIAGNOSIS — I10 Essential (primary) hypertension: Secondary | ICD-10-CM | POA: Diagnosis not present

## 2019-01-09 DIAGNOSIS — Z7689 Persons encountering health services in other specified circumstances: Secondary | ICD-10-CM | POA: Diagnosis not present

## 2019-01-09 DIAGNOSIS — E871 Hypo-osmolality and hyponatremia: Secondary | ICD-10-CM | POA: Diagnosis not present

## 2019-01-09 DIAGNOSIS — M810 Age-related osteoporosis without current pathological fracture: Secondary | ICD-10-CM | POA: Diagnosis not present

## 2019-01-09 DIAGNOSIS — E038 Other specified hypothyroidism: Secondary | ICD-10-CM | POA: Diagnosis not present

## 2019-01-09 DIAGNOSIS — E7849 Other hyperlipidemia: Secondary | ICD-10-CM | POA: Diagnosis not present

## 2019-01-10 DIAGNOSIS — H269 Unspecified cataract: Secondary | ICD-10-CM | POA: Diagnosis not present

## 2019-01-10 DIAGNOSIS — I7300 Raynaud's syndrome without gangrene: Secondary | ICD-10-CM | POA: Diagnosis not present

## 2019-01-10 DIAGNOSIS — Z0000 Encounter for general adult medical examination without abnormal findings: Secondary | ICD-10-CM | POA: Diagnosis not present

## 2019-01-10 DIAGNOSIS — M810 Age-related osteoporosis without current pathological fracture: Secondary | ICD-10-CM | POA: Diagnosis not present

## 2019-01-10 DIAGNOSIS — R627 Adult failure to thrive: Secondary | ICD-10-CM | POA: Diagnosis not present

## 2019-01-10 DIAGNOSIS — D899 Disorder involving the immune mechanism, unspecified: Secondary | ICD-10-CM | POA: Diagnosis not present

## 2019-01-10 DIAGNOSIS — M3500 Sicca syndrome, unspecified: Secondary | ICD-10-CM | POA: Diagnosis not present

## 2019-01-10 DIAGNOSIS — I10 Essential (primary) hypertension: Secondary | ICD-10-CM | POA: Diagnosis not present

## 2019-01-10 DIAGNOSIS — R82998 Other abnormal findings in urine: Secondary | ICD-10-CM | POA: Diagnosis not present

## 2019-01-10 DIAGNOSIS — N3941 Urge incontinence: Secondary | ICD-10-CM | POA: Diagnosis not present

## 2019-01-10 DIAGNOSIS — M3210 Systemic lupus erythematosus, organ or system involvement unspecified: Secondary | ICD-10-CM | POA: Diagnosis not present

## 2019-01-10 DIAGNOSIS — R05 Cough: Secondary | ICD-10-CM | POA: Diagnosis not present

## 2019-01-10 DIAGNOSIS — I480 Paroxysmal atrial fibrillation: Secondary | ICD-10-CM | POA: Diagnosis not present

## 2019-01-10 DIAGNOSIS — D696 Thrombocytopenia, unspecified: Secondary | ICD-10-CM | POA: Diagnosis not present

## 2019-02-13 NOTE — Progress Notes (Signed)
Office Visit Note  Patient: Alice Medina             Date of Birth: 1940-10-02           MRN: [id number]             PCP: Alice Baton, MD Referring: Alice Baton, MD Visit Date: 02/27/2019 Occupation: @GUAROCC @  Subjective:  Fatigue   History of Present Illness: Alice Medina is a 78 y.o. female with history of autoimmune disease, Sjogren's, and osteoarthritis.  She is taking Plaquenil 200 mg 1 tablet po daily. She continues to have chronic sicca symptoms.  She continues to have intermittent pain in both hands.  She denies any joint swelling.  She is afebrile her knee joints have been doing better.  She states that this past spring she had an improvement in her energy level and was more active.  She was also gardening more.  She states that with the summer heat she has been inside more and has been less active.  She continues to have chronic neck stiffness and lower back pain.  She denies any recent fevers or swollen lymph nodes.  She denies any symptoms of Raynolds.  She continues to have nasal ulcerations but denies any oral ulcerations.  She has not had any facial rashes.  She reports scattered papules that have been itching he is unsure if they are bug bites or something else is going on.  She has been using hydrocortisone cream topically.  She has not seen her dermatologist recently.  She reports that her gynecologist continues to order DEXAs.  She is on Evista 60 mg 3 times weekly.    Activities of Daily Living:  Patient reports morning stiffness for 15-30 minutes.   Patient Reports nocturnal pain.  Difficulty dressing/grooming: Denies Difficulty climbing stairs: Reports Difficulty getting out of chair: Reports Difficulty using hands for taps, buttons, cutlery, and/or writing: Reports  Review of Systems  Constitutional: Positive for fatigue.  HENT: Positive for mouth dryness. Negative for mouth sores and nose dryness.        Nose sores   Eyes: Positive for dryness. Negative  for pain, itching and visual disturbance.  Respiratory: Negative for cough, hemoptysis, shortness of breath, wheezing and difficulty breathing.   Cardiovascular: Negative for chest pain, palpitations, hypertension and swelling in legs/feet.  Gastrointestinal: Negative for abdominal pain, blood in stool, constipation and diarrhea.  Endocrine: Negative for increased urination.  Genitourinary: Negative for painful urination.  Musculoskeletal: Positive for arthralgias, joint pain, joint swelling and morning stiffness. Negative for myalgias, muscle weakness, muscle tenderness and myalgias.  Skin: Positive for rash. Negative for color change, pallor, hair loss, nodules/bumps, skin tightness, ulcers and sensitivity to sunlight.  Allergic/Immunologic: Negative for susceptible to infections.  Neurological: Negative for dizziness, light-headedness, numbness, headaches and memory loss.  Hematological: Negative for bruising/bleeding tendency and swollen glands.  Psychiatric/Behavioral: Negative for depressed mood, confusion and sleep disturbance. The patient is not nervous/anxious.     PMFS History:  Patient Active Problem List   Diagnosis Date Noted  . Multiple closed fractures of metacarpal bone 07/25/2017  . Autoimmune disease ([place]) +ANA +Ro +La +RF oral ulcers nasal ulcers Raynaud photosensitivity SICCA  01/13/2017  . High risk medication use 01/13/2017  . Photosensitivity 01/13/2017  . Sjogren's syndrome with keratoconjunctivitis sicca ([place]) 01/13/2017  . Recurrent oral ulcers and nasal ulcers  01/13/2017  . Raynaud's disease without gangrene 01/13/2017  . Primary osteoarthritis of both hands 01/13/2017  . Primary osteoarthritis of both knees  01/13/2017  . Primary osteoarthritis of both feet 01/13/2017  . Osteopenia of multiple sites 01/13/2017  . History of hypertension 01/13/2017  . History of hypothyroidism 01/13/2017  . History of gastroesophageal reflux (GERD)/ PUD 01/13/2017  . History  of MRSA infection 01/13/2017  . Diarrhea 05/28/2013  . Family history of malignant neoplasm of gastrointestinal tract 06/22/2011  . ADENOCARCINOMA, BREAST 05/02/2009  . CORONARY ARTERY DISEASE 05/02/2009  . DYSPHAGIA UNSPECIFIED 05/02/2009    Past Medical History:  Diagnosis Date  . ADENOCARCINOMA, BREAST 05/02/2009   Qualifier: Diagnosis of  By: Alice Ina MD, Alice Medina   . Anxiety   . Arthritis   . Atrial fibrillation ([place])   . Autoimmune disorder ([place])    SJORGREN'S  . Breast cancer ([place])    right breast  . Cardiac arrhythmia due to congenital heart disease   . Chronic headaches   . CORONARY ARTERY DISEASE 05/02/2009   Qualifier: Diagnosis of  By: Alice Ina MD, Alice Medina   . Depression   . Diarrhea 05/28/2013  . DYSPHAGIA UNSPECIFIED 05/02/2009   Qualifier: Diagnosis of  By: Alice Ina MD, Alice Medina   . Family history of malignant neoplasm of gastrointestinal tract 06/22/2011   Brother with colon cancer   . GERD (gastroesophageal reflux disease)   . Hemorrhage of rectum and anus 06/22/2011  . History of hypothyroidism 01/13/2017  . History of MRSA infection 01/13/2017  . Hypertension   . Multiple closed fractures of metacarpal bone 07/25/2017  . Myocardial infarction ([place])    1990  . Osteopenia of multiple sites 01/13/2017  . Osteoporosis   . Photosensitivity 01/13/2017  . Primary osteoarthritis of both hands 01/13/2017  . Raynaud's disease without gangrene 01/13/2017  . Recurrent oral ulcers and nasal ulcers  01/13/2017  . Sjogren's syndrome with keratoconjunctivitis sicca ([place]) 01/13/2017  . Status post dilation of esophageal narrowing   . Thyroid disease   . Ulcer     Family History  Problem Relation Age of Onset  . Breast cancer Mother   . Lung cancer Mother   . Cancer Father        larynx  . Heart failure Father   . Heart disease Brother   . Colon cancer Brother   . Pseudochol deficiency Other   . Breast cancer Sister   . Cancer Sister        thyroid   . Breast cancer  Maternal Grandmother   . Autoimmune disease Daughter   . Cancer Son        prostate   . Rheum arthritis Grandchild   . Esophageal cancer Neg Hx   . Rectal cancer Neg Hx    Past Surgical History:  Procedure Laterality Date  . APPENDECTOMY    . breast reconstuction     x 2  . CHOLECYSTECTOMY    . COLONOSCOPY    . double mastectomy    . PARTIAL HYSTERECTOMY  1990  . TIBIA FRACTURE SURGERY Right    with subsequent hardware removal  . UPPER GASTROINTESTINAL ENDOSCOPY    . WRIST FRACTURE SURGERY Left    Social History   Social History Narrative   1 cup of coffee daily   Immunization History  Administered Date(s) Administered  . Zoster Recombinat (Shingrix) 01/10/2018, 05/11/2018     Objective: Vital Signs: BP 124/74 (BP Location: Left Arm, Patient Position: Sitting, Cuff Size: Normal)   Pulse (!) 58   Resp 14   Ht 5' 5.5" (1.664 m)   Wt 153 lb 3.2 oz (69.5  kg)   BMI 25.11 kg/m    Physical Exam Vitals signs and nursing note reviewed.  Constitutional:      Appearance: She is well-developed.  HENT:     Head: Normocephalic and atraumatic.  Eyes:     Conjunctiva/sclera: Conjunctivae normal.  Neck:     Musculoskeletal: Normal range of motion.  Cardiovascular:     Rate and Rhythm: Normal rate and regular rhythm.     Heart sounds: Normal heart sounds.  Pulmonary:     Effort: Pulmonary effort is normal.     Breath sounds: Normal breath sounds.  Abdominal:     General: Bowel sounds are normal.     Palpations: Abdomen is soft.  Lymphadenopathy:     Cervical: No cervical adenopathy.  Skin:    General: Skin is warm and dry.     Capillary Refill: Capillary refill takes less than 2 seconds.  Neurological:     Mental Status: She is alert and oriented to person, place, and time.  Psychiatric:        Behavior: Behavior normal.      Musculoskeletal Exam: C-spine limited ROM.  Thoracic kyphosis noted.  Limited ROM of lumbar spine.  Shoulder joints and elbow joints good  ROM.  Limited ROM of right wrist joint.  MCPs, PIPs, and DIPs good ROM with no synovitis.  PIP and DIP synovial thickening consistent with osteoarthritis of both hands. Hip joints, knee joints, ankle joints, MTPs, PIPs, and DIPs good ROM with no synovitis.  No warmth or effusion of knee joints.  No tenderness or swelling of ankle joints.   CDAI Exam: CDAI Score: - Patient Global: -; Provider Global: - Swollen: -; Tender: - Joint Exam   No joint exam has been documented for this visit   There is currently no information documented on the homunculus. Go to the Rheumatology activity and complete the homunculus joint exam.  Investigation: No additional findings.  Imaging: No results found.  Recent Labs: Lab Results  Component Value Date   WBC 3.8 09/26/2018   HGB 14.1 09/26/2018   PLT 213 09/26/2018   NA 138 09/26/2018   K 4.1 09/26/2018   CL 103 09/26/2018   CO2 28 09/26/2018   GLUCOSE 57 (L) 09/26/2018   BUN 21 09/26/2018   CREATININE 0.92 09/26/2018   BILITOT 0.4 09/26/2018   ALKPHOS 79 12/13/2017   AST 30 09/26/2018   ALT 14 09/26/2018   PROT 7.5 09/26/2018   PROT 7.7 09/26/2018   ALBUMIN 3.6 12/13/2017   CALCIUM 10.3 09/26/2018   GFRAA 69 09/26/2018    Speciality Comments: PLQ Eye Exam: 05/26/18 WNL @ [facility] [place] Follow up in 1 year  Procedures:  No procedures performed Allergies: Codeine, Robinul [glycopyrrolate], and Sulfamethoxazole-trimethoprim   Assessment / Plan:     Visit Diagnoses: Autoimmune disease ([place]) +ANA +Ro +La +RF oral ulcers nasal ulcers Raynaud photosensitivity SICCA  -She has not had any signs or symptoms of a flare.  She is clinically doing well on Plaquenil 200 mg 1 tablet by mouth daily.  She has no synovitis on exam.  She continues to have intermittent pain in both hands and both knee joints.  She has chronic sicca symptoms, and she uses restasis 1 drop BID in both eyes for symptomatic relief.  She has not had any symptoms of  Raynaud's.  No digital ulcerations or signs of gangrene.  She has chronic nasal ulcerations but no oral ulcerations. No malar rash noted.  She has not  had any recent fevers or swollen lymph nodes.  She has not had any palpitations or shortness of breath.  She will continue on PLQ as prescribed.  She does not need any refills at this time.  We will check dsDNA, sed rate, and complements today.  she was advised to notify us if she develops new or worsening symptoms.  She will follow up in 5 months.  Plan: Anti-DNA antibody, double-stranded, C3 and C4, Sedimentation rate  High risk medication use - PLQ 200 mg 1 tablet by mouth daily.  Eye Exam: 05/26/18.  CBC and CMP were drawn on 01/29/19.      Sjogren's syndrome with keratoconjunctivitis sicca ([place]) - She has chronic sicca symptoms.  She uses restasis 1 drop in both eyes BID.  She continues to take PLQ 200 mg 1 tablet daily.  SPEP and RF were updated on 09/26/18.  IFE did not reveal any monoclonal proteins.  RF chronically 18.  CBC, CMP, and UA were updated on 01/29/19.    Raynaud's disease without gangrene - She has no symptoms of Raynaud's.  She has no digital ulcerations or signs of gangrene.   Primary osteoarthritis of both hands - She has PIP and DIP synovial thickening consistent with osteoarthritis of both hands. She has intermittent pain in both hands.  She has limited ROM of the right wrist joint.  Joint protection and muscle strengthening were discussed.   Primary osteoarthritis of both knees - She has good ROM with no discomfort.  No warmth or effusion of knee joints.  She has been more active recently, which she feels has improved her joint pain.   Primary osteoarthritis of both feet - She has no discomfort in her feet at this time.  She wears proper fitting shoes.   Age-related osteoporosis without current pathological fracture - 12/14/18 DEXA BMD 0.523 with T-score -2.9 1/3 right forearm.  She is prescribed Evista 60 mg 3 times weekly prescribed  by her gynecologist.  Patient states she has been treated with Fosamax for 15 years in the past.  Other medical conditions are listed as follows:   History of hypertension   History of hypothyroidism   History of gastroesophageal reflux (GERD)/ PUD   History of MRSA infection   Orders: Orders Placed This Encounter  Procedures  . Anti-DNA antibody, double-stranded  . C3 and C4  . Sedimentation rate   No orders of the defined types were placed in this encounter.     Follow-Up Instructions: Return in about 5 months (around 07/30/2019) for Autoimmune Disease, Sjogren's syndrome, Osteoarthritis.   Ofilia Neas, PA-C   I examined and evaluated the patient with Hazel Sams PA..  She continues to have sicca symptoms.  The combination of Plaquenil and over-the-counter products has been working well.  She had no synovitis on my examination.  She has some stiffness from underlying osteoarthritis.  The plan of care was discussed as noted above.  Bo Merino, MD  Note - This record has been created using Editor, commissioning.  Chart creation errors have been sought, but may not always  have been located. Such creation errors do not reflect on  the standard of medical care.

## 2019-02-27 ENCOUNTER — Encounter: Payer: Self-pay | Admitting: Rheumatology

## 2019-02-27 ENCOUNTER — Other Ambulatory Visit: Payer: Self-pay

## 2019-02-27 ENCOUNTER — Ambulatory Visit: Payer: PPO | Admitting: Rheumatology

## 2019-02-27 VITALS — BP 124/74 | HR 58 | Resp 14 | Ht 65.5 in | Wt 153.2 lb

## 2019-02-27 DIAGNOSIS — Z8679 Personal history of other diseases of the circulatory system: Secondary | ICD-10-CM

## 2019-02-27 DIAGNOSIS — M170 Bilateral primary osteoarthritis of knee: Secondary | ICD-10-CM

## 2019-02-27 DIAGNOSIS — M19071 Primary osteoarthritis, right ankle and foot: Secondary | ICD-10-CM

## 2019-02-27 DIAGNOSIS — M810 Age-related osteoporosis without current pathological fracture: Secondary | ICD-10-CM | POA: Diagnosis not present

## 2019-02-27 DIAGNOSIS — M19042 Primary osteoarthritis, left hand: Secondary | ICD-10-CM

## 2019-02-27 DIAGNOSIS — Z8719 Personal history of other diseases of the digestive system: Secondary | ICD-10-CM

## 2019-02-27 DIAGNOSIS — M3501 Sicca syndrome with keratoconjunctivitis: Secondary | ICD-10-CM | POA: Diagnosis not present

## 2019-02-27 DIAGNOSIS — M359 Systemic involvement of connective tissue, unspecified: Secondary | ICD-10-CM | POA: Diagnosis not present

## 2019-02-27 DIAGNOSIS — I7300 Raynaud's syndrome without gangrene: Secondary | ICD-10-CM | POA: Diagnosis not present

## 2019-02-27 DIAGNOSIS — M19041 Primary osteoarthritis, right hand: Secondary | ICD-10-CM

## 2019-02-27 DIAGNOSIS — Z79899 Other long term (current) drug therapy: Secondary | ICD-10-CM

## 2019-02-27 DIAGNOSIS — Z8614 Personal history of Methicillin resistant Staphylococcus aureus infection: Secondary | ICD-10-CM | POA: Diagnosis not present

## 2019-02-27 DIAGNOSIS — Z8639 Personal history of other endocrine, nutritional and metabolic disease: Secondary | ICD-10-CM | POA: Diagnosis not present

## 2019-02-27 DIAGNOSIS — M19072 Primary osteoarthritis, left ankle and foot: Secondary | ICD-10-CM

## 2019-02-28 LAB — C3 AND C4
C3 Complement: 117 mg/dL (ref 83–193)
C4 Complement: 23 mg/dL (ref 15–57)

## 2019-02-28 LAB — ANTI-DNA ANTIBODY, DOUBLE-STRANDED: ds DNA Ab: 1 IU/mL

## 2019-02-28 LAB — SEDIMENTATION RATE: Sed Rate: 25 mm/h (ref 0–30)

## 2019-02-28 NOTE — Progress Notes (Signed)
DsDNA is negative.  Complements WNL.  Sed rate WNL.

## 2019-03-20 DIAGNOSIS — M810 Age-related osteoporosis without current pathological fracture: Secondary | ICD-10-CM | POA: Diagnosis not present

## 2019-03-25 ENCOUNTER — Other Ambulatory Visit: Payer: Self-pay | Admitting: Rheumatology

## 2019-03-25 DIAGNOSIS — M359 Systemic involvement of connective tissue, unspecified: Secondary | ICD-10-CM

## 2019-03-26 NOTE — Telephone Encounter (Signed)
Last Visit: 02/27/19 Next Visit: 08/03/19 Labs: 01/09/19 Eye exam: 05/26/18 WNL  Okay to refill per Dr. Estanislado Pandy

## 2019-04-07 DIAGNOSIS — Z23 Encounter for immunization: Secondary | ICD-10-CM | POA: Diagnosis not present

## 2019-04-15 DIAGNOSIS — Z20828 Contact with and (suspected) exposure to other viral communicable diseases: Secondary | ICD-10-CM | POA: Diagnosis not present

## 2019-05-19 DIAGNOSIS — Z20828 Contact with and (suspected) exposure to other viral communicable diseases: Secondary | ICD-10-CM | POA: Diagnosis not present

## 2019-06-28 ENCOUNTER — Telehealth: Payer: Self-pay

## 2019-06-28 ENCOUNTER — Telehealth: Payer: Self-pay | Admitting: Rheumatology

## 2019-06-28 DIAGNOSIS — M359 Systemic involvement of connective tissue, unspecified: Secondary | ICD-10-CM

## 2019-06-28 DIAGNOSIS — Z79899 Other long term (current) drug therapy: Secondary | ICD-10-CM

## 2019-06-28 MED ORDER — HYDROXYCHLOROQUINE SULFATE 200 MG PO TABS: 200.0000 mg | ORAL_TABLET | Freq: Every day | ORAL | 0 refills | Status: DC

## 2019-06-28 NOTE — Telephone Encounter (Signed)
Patient states Dr. Virgina Jock has not received lab orders. Please fax to # [phone].

## 2019-06-28 NOTE — Telephone Encounter (Signed)
Last Visit: 02/27/2019 Next Visit: 08/03/2019 Labs: 01/09/2019 Eye exam: 05/26/2018  Advised patient she is due to update labs and eye exam. Patient states she is scheduled for an eye appointment on 06/29/2019 and will have results faxed.   Lab orders have been faxed to Dr. Keane Police office per patients request.   Okay to refill 30 day supply, per Dr. Estanislado Pandy.

## 2019-06-28 NOTE — Telephone Encounter (Signed)
Lab orders re-faxed to Dr. Virgina Jock.

## 2019-06-29 DIAGNOSIS — D696 Thrombocytopenia, unspecified: Secondary | ICD-10-CM | POA: Diagnosis not present

## 2019-06-29 DIAGNOSIS — Z79899 Other long term (current) drug therapy: Secondary | ICD-10-CM | POA: Diagnosis not present

## 2019-06-29 DIAGNOSIS — H353131 Nonexudative age-related macular degeneration, bilateral, early dry stage: Secondary | ICD-10-CM | POA: Diagnosis not present

## 2019-06-29 DIAGNOSIS — M329 Systemic lupus erythematosus, unspecified: Secondary | ICD-10-CM | POA: Diagnosis not present

## 2019-06-29 DIAGNOSIS — H35373 Puckering of macula, bilateral: Secondary | ICD-10-CM | POA: Diagnosis not present

## 2019-06-29 DIAGNOSIS — E039 Hypothyroidism, unspecified: Secondary | ICD-10-CM | POA: Diagnosis not present

## 2019-07-04 DIAGNOSIS — H25013 Cortical age-related cataract, bilateral: Secondary | ICD-10-CM | POA: Diagnosis not present

## 2019-07-04 DIAGNOSIS — H2513 Age-related nuclear cataract, bilateral: Secondary | ICD-10-CM | POA: Diagnosis not present

## 2019-07-04 DIAGNOSIS — H04123 Dry eye syndrome of bilateral lacrimal glands: Secondary | ICD-10-CM | POA: Diagnosis not present

## 2019-07-17 DIAGNOSIS — I10 Essential (primary) hypertension: Secondary | ICD-10-CM | POA: Diagnosis not present

## 2019-07-17 DIAGNOSIS — Z79899 Other long term (current) drug therapy: Secondary | ICD-10-CM | POA: Diagnosis not present

## 2019-07-17 DIAGNOSIS — D696 Thrombocytopenia, unspecified: Secondary | ICD-10-CM | POA: Diagnosis not present

## 2019-07-17 DIAGNOSIS — M359 Systemic involvement of connective tissue, unspecified: Secondary | ICD-10-CM | POA: Diagnosis not present

## 2019-07-17 DIAGNOSIS — M3500 Sicca syndrome, unspecified: Secondary | ICD-10-CM | POA: Diagnosis not present

## 2019-07-26 ENCOUNTER — Other Ambulatory Visit: Payer: Self-pay

## 2019-07-26 DIAGNOSIS — M359 Systemic involvement of connective tissue, unspecified: Secondary | ICD-10-CM

## 2019-07-26 DIAGNOSIS — Z79899 Other long term (current) drug therapy: Secondary | ICD-10-CM

## 2019-07-26 MED ORDER — HYDROXYCHLOROQUINE SULFATE 200 MG PO TABS: 200.0000 mg | ORAL_TABLET | Freq: Every day | ORAL | 0 refills | Status: DC

## 2019-07-26 NOTE — Telephone Encounter (Addendum)
Refill request received via fax from [facility] for [place].   Last Visit: 02/27/2019 Next Visit: 08/03/2019 Labs: 07/17/2019 Eye exam: 06/29/2019   Okay to refill per Dr. Estanislado Pandy.

## 2019-08-02 ENCOUNTER — Telehealth: Payer: Self-pay | Admitting: *Deleted

## 2019-08-02 NOTE — Telephone Encounter (Signed)
Lab Results from 07/17/19   WBC 4.07 MCV 97.6 RDW 11.4 NEU % 38.0  ESR 50 C3 119  C4 17 Ds DNA 1  Reviewed by Hazel Sams PA-C

## 2019-08-03 ENCOUNTER — Ambulatory Visit: Payer: PPO | Admitting: Rheumatology

## 2019-09-03 ENCOUNTER — Ambulatory Visit: Payer: PPO | Attending: Internal Medicine

## 2019-09-03 DIAGNOSIS — Z23 Encounter for immunization: Secondary | ICD-10-CM | POA: Insufficient documentation

## 2019-09-03 NOTE — Progress Notes (Signed)
   Covid-19 Vaccination Clinic  Name:  Kori Knappe    MRN: [id number] DOB: 30-Sep-1940  09/03/2019  Ms. Shepperson was observed post Covid-19 immunization for 15 minutes without incidence. She was provided with Vaccine Information Sheet and instruction to access the V-Safe system.   Ms. Unsworth was instructed to call 911 with any severe reactions post vaccine: Marland Kitchen Difficulty breathing  . Swelling of your face and throat  . A fast heartbeat  . A bad rash all over your body  . Dizziness and weakness    Immunizations Administered    Name Date Dose VIS Date Route   Pfizer COVID-19 Vaccine 09/03/2019  6:01 PM 0.3 mL 07/06/2019 Intramuscular   Manufacturer: [place]   Lot: [id number]   [place]: [id number]

## 2019-09-12 NOTE — Progress Notes (Signed)
Office Visit Note  Patient: Alice Medina             Date of Birth: 11-18-1940           MRN: [id number]             PCP: Shon Baton, MD Referring: Shon Baton, MD Visit Date: 09/17/2019 Occupation: @GUAROCC @  Subjective:   Bilateral Hand Pain and Swelling   History of Present Illness: Shaylin Carrano is a 79 y.o. female with a past medical history of autoimmune disease, Sjogren's, and osteoarthritis. Since her last visit, she has had COVID-19. She states that she has recovered and is doing well. She is currently on Plaquenil 200 mg po daily. She endorses stiffness in the morning that is most prominent in her hands and neck. She also endorses pain in her hips and lower back, especially the right side. She states that she sleeps in a recliner at night and that this has helped with the pain. She endorses Raynaud's in her bilateral hands and feet as well as intermittent nasal ulcers. She denies any ulcerations in her mouth. She reports that she often wakes up in the middle of the night with a burning sensation and aching in her feet. She currently uses Voltaren gel that helps. Patient endorses swelling in her bilateral ankles and hands. She states that her dry eyes continue to be a significant problem for her. She currently uses Restasis and Systane that help alleviate the symptoms. She is scheduled for cataract surgery this upcoming Thursday. She also endorses dry mouth. Denies any rash at this time. Her gynecologist has continued to schedule regular DEXA scans and she is currently on Evista 60 mg three times weekly.   Activities of Daily Living:  Patient reports morning stiffness for several hours.   Patient Reports nocturnal pain.  Difficulty dressing/grooming: Denies Difficulty climbing stairs: Reports Difficulty getting out of chair: Reports Difficulty using hands for taps, buttons, cutlery, and/or writing: Reports  Review of Systems  Constitutional: Negative for fatigue, night  sweats, weight gain and weight loss.  HENT: Positive for mouth dryness and nose dryness. Negative for mouth sores, trouble swallowing and trouble swallowing.        Nose sores  Eyes: Positive for dryness. Negative for pain, redness and visual disturbance.  Respiratory: Negative for cough, shortness of breath, wheezing and difficulty breathing.   Cardiovascular: Negative for chest pain, palpitations, hypertension, irregular heartbeat and swelling in legs/feet.  Gastrointestinal: Negative for blood in stool, constipation and diarrhea.  Endocrine: Negative for increased urination.  Genitourinary: Negative for painful urination and vaginal dryness.  Musculoskeletal: Positive for arthralgias, joint pain, joint swelling and morning stiffness. Negative for myalgias, muscle weakness, muscle tenderness and myalgias.  Skin: Negative for color change, rash, hair loss, skin tightness, ulcers and sensitivity to sunlight.  Allergic/Immunologic: Negative for susceptible to infections.  Neurological: Positive for dizziness and memory loss. Negative for headaches, night sweats and weakness.  Hematological: Negative for bruising/bleeding tendency and swollen glands.  Psychiatric/Behavioral: Negative for depressed mood, confusion and sleep disturbance. The patient is not nervous/anxious.     PMFS History:  Patient Active Problem List   Diagnosis Date Noted  . Multiple closed fractures of metacarpal bone 07/25/2017  . Autoimmune disease ([place]) +ANA +Ro +La +RF oral ulcers nasal ulcers Raynaud photosensitivity SICCA  01/13/2017  . High risk medication use 01/13/2017  . Photosensitivity 01/13/2017  . Sjogren's syndrome with keratoconjunctivitis sicca ([place]) 01/13/2017  . Recurrent oral ulcers and nasal ulcers  01/13/2017  . Raynaud's disease without gangrene 01/13/2017  . Primary osteoarthritis of both hands 01/13/2017  . Primary osteoarthritis of both knees 01/13/2017  . Primary osteoarthritis of both feet  01/13/2017  . Osteopenia of multiple sites 01/13/2017  . History of hypertension 01/13/2017  . History of hypothyroidism 01/13/2017  . History of gastroesophageal reflux (GERD)/ PUD 01/13/2017  . History of MRSA infection 01/13/2017  . Diarrhea 05/28/2013  . Family history of malignant neoplasm of gastrointestinal tract 06/22/2011  . ADENOCARCINOMA, BREAST 05/02/2009  . CORONARY ARTERY DISEASE 05/02/2009  . DYSPHAGIA UNSPECIFIED 05/02/2009    Past Medical History:  Diagnosis Date  . ADENOCARCINOMA, BREAST 05/02/2009   Qualifier: Diagnosis of  By: Deatra Ina MD, Sandy Salaam   . Anxiety   . Arthritis   . Atrial fibrillation ([place])   . Autoimmune disorder ([place])    SJORGREN'S  . Breast cancer ([place])    right breast  . Cardiac arrhythmia due to congenital heart disease   . Chronic headaches   . CORONARY ARTERY DISEASE 05/02/2009   Qualifier: Diagnosis of  By: Deatra Ina MD, Sandy Salaam   . Depression   . Diarrhea 05/28/2013  . DYSPHAGIA UNSPECIFIED 05/02/2009   Qualifier: Diagnosis of  By: Deatra Ina MD, Sandy Salaam   . Family history of malignant neoplasm of gastrointestinal tract 06/22/2011   Brother with colon cancer   . GERD (gastroesophageal reflux disease)   . Hemorrhage of rectum and anus 06/22/2011  . History of hypothyroidism 01/13/2017  . History of MRSA infection 01/13/2017  . Hypertension   . Multiple closed fractures of metacarpal bone 07/25/2017  . Myocardial infarction ([place])    1990  . Osteopenia of multiple sites 01/13/2017  . Osteoporosis   . Photosensitivity 01/13/2017  . Primary osteoarthritis of both hands 01/13/2017  . Raynaud's disease without gangrene 01/13/2017  . Recurrent oral ulcers and nasal ulcers  01/13/2017  . Sjogren's syndrome with keratoconjunctivitis sicca ([place]) 01/13/2017  . Status post dilation of esophageal narrowing   . Thyroid disease   . Ulcer     Family History  Problem Relation Age of Onset  . Breast cancer Mother   . Lung cancer Mother   . Cancer Father         larynx  . Heart failure Father   . Heart disease Brother   . Colon cancer Brother   . Pseudochol deficiency Other   . Breast cancer Sister   . Cancer Sister        thyroid   . Breast cancer Maternal Grandmother   . Autoimmune disease Daughter   . Cancer Son        prostate   . Diabetes Son   . Rheum arthritis Grandchild   . Esophageal cancer Neg Hx   . Rectal cancer Neg Hx    Past Surgical History:  Procedure Laterality Date  . APPENDECTOMY    . breast reconstuction     x 2  . CHOLECYSTECTOMY    . COLONOSCOPY    . double mastectomy    . PARTIAL HYSTERECTOMY  1990  . TIBIA FRACTURE SURGERY Right    with subsequent hardware removal  . UPPER GASTROINTESTINAL ENDOSCOPY    . WRIST FRACTURE SURGERY Left    Social History   Social History Narrative   1 cup of coffee daily   Immunization History  Administered Date(s) Administered  . PFIZER SARS-COV-2 Vaccination 09/03/2019  . Zoster Recombinat (Shingrix) 01/10/2018, 05/11/2018     Objective: Vital Signs: BP 98/61 (  BP Location: Left Arm, Patient Position: Sitting, Cuff Size: Normal)   Pulse 61   Resp 15   Ht 5' 5.5" (1.664 m)   Wt 153 lb 9.6 oz (69.7 kg)   BMI 25.17 kg/m    Physical Exam Vitals and nursing note reviewed.  Constitutional:      Appearance: Normal appearance. She is normal weight.  HENT:     Head: Normocephalic and atraumatic.  Eyes:     Extraocular Movements: Extraocular movements intact.  Cardiovascular:     Rate and Rhythm: Normal rate.  Pulmonary:     Effort: Pulmonary effort is normal.  Abdominal:     General: Abdomen is flat.     Palpations: Abdomen is soft.  Musculoskeletal:        General: Swelling (Bilateral 2nd PIPs) and tenderness (Bilateral 2nd PIPs) present.     Right lower leg: No edema.     Left lower leg: No edema.  Skin:    General: Skin is warm and dry.  Neurological:     Mental Status: She is alert and oriented to person, place, and time.  Psychiatric:        Mood  and Affect: Mood normal.        Behavior: Behavior normal.        Thought Content: Thought content normal.      Musculoskeletal Exam: C-spine limited ROM. Limited ROM of lumbar spine. Shoulder and elbow joints with good ROM. Limited ROM of bilateral wrist joints. MCPs, PIPs, and DIPs with good ROM. Synovitis and tenderness noted in bilateral 2nd PIPs. PIP and DIP synovial thickening consistent with osteoarthritis of both hands. Hip joints, knee joints, ankle joints, MTPs, PIPs, and DIPs with good ROM and no synovitis. No warmth or effusion of knee joints. Ankles tender to palpation, but no inflammation or swelling noted.   CDAI Exam: CDAI Score: -- Patient Global: --; Provider Global: -- Swollen: --; Tender: -- Joint Exam 09/17/2019   No joint exam has been documented for this visit   There is currently no information documented on the homunculus. Go to the Rheumatology activity and complete the homunculus joint exam.  Investigation: No additional findings.  Imaging: No results found.  Recent Labs: Lab Results  Component Value Date   WBC 3.8 09/26/2018   HGB 14.1 09/26/2018   PLT 213 09/26/2018   NA 138 09/26/2018   K 4.1 09/26/2018   CL 103 09/26/2018   CO2 28 09/26/2018   GLUCOSE 57 (L) 09/26/2018   BUN 21 09/26/2018   CREATININE 0.92 09/26/2018   BILITOT 0.4 09/26/2018   ALKPHOS 79 12/13/2017   AST 30 09/26/2018   ALT 14 09/26/2018   PROT 7.5 09/26/2018   PROT 7.7 09/26/2018   ALBUMIN 3.6 12/13/2017   CALCIUM 10.3 09/26/2018   GFRAA 69 09/26/2018    Speciality Comments: PLQ Eye Exam: 06/29/2019 WNL @ [facility].  Procedures:  No procedures performed Allergies: Codeine, Robinul [glycopyrrolate], and Sulfamethoxazole-trimethoprim   Assessment / Plan:     Visit Diagnoses: Autoimmune disease ([place]) +ANA +Ro +La +RF oral ulcers nasal ulcers Raynaud photosensitivity SICCA - Patient is experiencing synovitis and tenderness of her 2nd PIPs. She  endorses stiffness and swelling of her bilateral hands as well as stiffness in her neck. She continues to experience persistent hip and lumbar back pain, worse on her right side. She has chronic sicca symptoms that she states have worsened since her last visit. She continues to use Restasis one drop BID  and Systane with some alleviation of her symptoms. She reports worsening Raynaud's in her bilateral fingers and toes. No evidence of ulceration or gangrene. She has persistent nasal ulcerations without oral ulcerations. No malar rash noted. Denies palpitations, chest pain, or shortness of breath. We will increase her Plaquenil from 200 mg po daily to 200 mg po BID Monday through Friday. Her last eye exam was on 06/29/2019 and was within normal limits. She will follow up in 3 months.   High risk medication use - Increase PLQ 200 mg 1 tablet by mouth daily to 200 mg 1 tablet by mouth BID Monday through Friday. Eye Exam: 06/29/2019. Follow up in 3 months. Last CBC and CMP were on 07/17/2019.  Sjogren's syndrome with keratoconjunctivitis sicca ([place]) - She has chronic sicca symptoms. Continue Restasis 1 drop in both eyes BID and Systane. She has continued to take Plaquenil 200 mg 1 tablet daily.   Raynaud's disease without gangrene - She endorses symptoms of Raynaud's in bilateral fingers and toes. No digital ulcerations or signs of gangrene.  Warm clothing and keeping core temperature warm was discussed.  Primary osteoarthritis of both hands - PIP and DIP synovial thickening consistent with osteoarthritis of both hands. She endorses pain in bilateral hands. Limited ROM of bilateral wrists. Synovitis and tenderness noted over bilateral 2nd PIPs. Good ROM of of bilateral MCPs, PIPs, and DIPs.  Primary osteoarthritis of both knees - Good ROM with no discomfort. No warmth or effusion of knee joints.   Primary osteoarthritis of both feet - She has no discomfort in her feet at this time. She wears proper fitting  shoes.   Age-related osteoporosis without current pathological fracture - 12/14/18 DEXA BMD 0.523 with T-score -2.9 1/3 right forearm.  She is prescribed Evista 60 mg 3 times weekly prescribed by her gynecologist.    Other medical problems listed as follows:  History of hypothyroidism  History of gastroesophageal reflux (GERD)/ PUD  History of hypertension  History of MRSA infection  Orders: No orders of the defined types were placed in this encounter.  No orders of the defined types were placed in this encounter.     Follow-Up Instructions: Return in about 3 months (around 12/15/2019) for Autoimmune disease.   Bo Merino, MD  Note - This record has been created using Editor, commissioning.  Chart creation errors have been sought, but may not always  have been located. Such creation errors do not reflect on  the standard of medical care.

## 2019-09-17 ENCOUNTER — Encounter: Payer: Self-pay | Admitting: Rheumatology

## 2019-09-17 ENCOUNTER — Ambulatory Visit: Payer: PPO | Admitting: Rheumatology

## 2019-09-17 ENCOUNTER — Other Ambulatory Visit: Payer: Self-pay

## 2019-09-17 VITALS — BP 98/61 | HR 61 | Resp 15 | Ht 65.5 in | Wt 153.6 lb

## 2019-09-17 DIAGNOSIS — M19042 Primary osteoarthritis, left hand: Secondary | ICD-10-CM

## 2019-09-17 DIAGNOSIS — M3501 Sicca syndrome with keratoconjunctivitis: Secondary | ICD-10-CM

## 2019-09-17 DIAGNOSIS — Z8639 Personal history of other endocrine, nutritional and metabolic disease: Secondary | ICD-10-CM | POA: Diagnosis not present

## 2019-09-17 DIAGNOSIS — M359 Systemic involvement of connective tissue, unspecified: Secondary | ICD-10-CM

## 2019-09-17 DIAGNOSIS — Z79899 Other long term (current) drug therapy: Secondary | ICD-10-CM | POA: Diagnosis not present

## 2019-09-17 DIAGNOSIS — M19041 Primary osteoarthritis, right hand: Secondary | ICD-10-CM

## 2019-09-17 DIAGNOSIS — Z8614 Personal history of Methicillin resistant Staphylococcus aureus infection: Secondary | ICD-10-CM

## 2019-09-17 DIAGNOSIS — Z8679 Personal history of other diseases of the circulatory system: Secondary | ICD-10-CM

## 2019-09-17 DIAGNOSIS — M19071 Primary osteoarthritis, right ankle and foot: Secondary | ICD-10-CM

## 2019-09-17 DIAGNOSIS — I7300 Raynaud's syndrome without gangrene: Secondary | ICD-10-CM | POA: Diagnosis not present

## 2019-09-17 DIAGNOSIS — M810 Age-related osteoporosis without current pathological fracture: Secondary | ICD-10-CM

## 2019-09-17 DIAGNOSIS — M170 Bilateral primary osteoarthritis of knee: Secondary | ICD-10-CM | POA: Diagnosis not present

## 2019-09-17 DIAGNOSIS — Z8719 Personal history of other diseases of the digestive system: Secondary | ICD-10-CM

## 2019-09-17 DIAGNOSIS — M19072 Primary osteoarthritis, left ankle and foot: Secondary | ICD-10-CM

## 2019-09-17 MED ORDER — HYDROXYCHLOROQUINE SULFATE 200 MG PO TABS: ORAL_TABLET | ORAL | 0 refills | Status: DC

## 2019-09-17 NOTE — Patient Instructions (Signed)
Standing Labs We placed an order today for your standing lab work.    Please come back and get your standing labs in May   We have open lab daily Monday through Thursday from 8:30-12:30 PM and 1:30-4:30 PM and Friday from 8:30-12:30 PM and 1:30-4:00 PM at the office of Dr. Bo Merino.   You may experience shorter wait times on Monday and Friday afternoons. The office is located at [address] [place], [place], [place] [address] No appointment is necessary.   Labs are drawn by [facility].  You may receive a bill from [facility] for your lab work.  If you wish to have your labs drawn at another location, please call the office 24 hours in advance to send orders.  If you have any questions regarding directions or hours of operation,  please call [phone].   Just as a reminder please drink plenty of water prior to coming for your lab work. Thanks!

## 2019-09-17 NOTE — Addendum Note (Signed)
Addended by: Shona Needles on: 09/17/2019 12:05 PM   Modules accepted: Orders

## 2019-09-20 DIAGNOSIS — H2511 Age-related nuclear cataract, right eye: Secondary | ICD-10-CM | POA: Diagnosis not present

## 2019-09-20 DIAGNOSIS — H25011 Cortical age-related cataract, right eye: Secondary | ICD-10-CM | POA: Diagnosis not present

## 2019-09-20 DIAGNOSIS — H25811 Combined forms of age-related cataract, right eye: Secondary | ICD-10-CM | POA: Diagnosis not present

## 2019-09-28 ENCOUNTER — Ambulatory Visit: Payer: PPO | Attending: Internal Medicine

## 2019-09-28 DIAGNOSIS — Z23 Encounter for immunization: Secondary | ICD-10-CM

## 2019-09-28 NOTE — Progress Notes (Signed)
   Covid-19 Vaccination Clinic  Name:  Alice Medina    MRN: [id number] DOB: 12/04/1940  09/28/2019  [name] was observed post Covid-19 immunization for 15 minutes without incident. She was provided with Vaccine Information Sheet and instruction to access the V-Safe system.   [name] was instructed to call 911 with any severe reactions post vaccine: Marland Kitchen Difficulty breathing  . Swelling of face and throat  . A fast heartbeat  . A bad rash all over body  . Dizziness and weakness   Immunizations Administered    Name Date Dose VIS Date Route   Pfizer COVID-19 Vaccine 09/28/2019  5:22 PM 0.3 mL 07/06/2019 Intramuscular   Manufacturer: [employer]   Lot: [id number]   [place]: [id number]

## 2019-10-29 ENCOUNTER — Other Ambulatory Visit: Payer: Self-pay

## 2019-10-29 MED ORDER — AMLODIPINE BESYLATE 2.5 MG PO TABS: 2.5000 mg | ORAL_TABLET | Freq: Every day | ORAL | 1 refills | Status: DC

## 2019-11-02 DIAGNOSIS — H353112 Nonexudative age-related macular degeneration, right eye, intermediate dry stage: Secondary | ICD-10-CM | POA: Diagnosis not present

## 2019-11-26 ENCOUNTER — Other Ambulatory Visit: Payer: Self-pay | Admitting: Cardiology

## 2019-12-05 NOTE — Progress Notes (Signed)
Office Visit Note  Patient: Alice Medina             Date of Birth: 05-26-1941           MRN: [id number]             PCP: Shon Baton, MD Referring: Shon Baton, MD Visit Date: 12/13/2019 Occupation: @GUAROCC @  Subjective:  Right SI joint pain   History of Present Illness: Valerieann Pulsifer is a 79 y.o. female with history of autoimmune disease, Sjogren's, and osteoarthritis.  Patient is taking Plaquenil 200 mg 1 tablet by mouth twice daily Monday through Friday.  She is tolerating the higher dose of Plaquenil.  She has noticed an improvement since increasing the dose of Plaquenil.  She has been gardening daily for up to 4 to 5 hours.  She feels that the gardening has been helping with her joint stiffness and muscle strengthening.  She does have increased discomfort in her hands in the evenings after gardening all day.  She continues to have persistent pain and inflammation in the right index finger.  She presents today with ongoing right SI joint pain.  She has been experiencing increased discomfort and stiffness at night in her lower back.  She continues to have photosensitivity and tries to wear sun protective clothing and a hat while working in the yard.  She has persistent nasal ulcerations but no oral ulcerations.  She has chronic sicca symptoms which have been tolerable overall.  She has intermittent symptoms of Raynaud's in her fingers and toes.  She has also been experiencing symptoms of neuropathy in both feet.     Activities of Daily Living:  Patient reports morning stiffness for 30 minutes.   Patient Reports nocturnal pain.  Difficulty dressing/grooming: Denies Difficulty climbing stairs: Reports Difficulty getting out of chair: Reports Difficulty using hands for taps, buttons, cutlery, and/or writing: Denies  Review of Systems  Constitutional: Positive for fatigue.  HENT: Positive for mouth dryness. Negative for mouth sores and nose dryness.   Eyes: Positive for dryness.  Negative for pain and visual disturbance.  Respiratory: Negative for cough, hemoptysis and difficulty breathing.   Cardiovascular: Positive for swelling in legs/feet. Negative for chest pain, palpitations and hypertension.  Gastrointestinal: Positive for constipation. Negative for blood in stool and diarrhea.  Endocrine: Negative for increased urination.  Genitourinary: Negative for painful urination.  Musculoskeletal: Positive for arthralgias, gait problem, joint pain, joint swelling, muscle weakness, morning stiffness and muscle tenderness. Negative for myalgias and myalgias.  Skin: Negative for color change, pallor, rash, hair loss, nodules/bumps, skin tightness, ulcers and sensitivity to sunlight.  Allergic/Immunologic: Negative for susceptible to infections.  Neurological: Negative for dizziness and headaches.  Hematological: Negative for swollen glands.  Psychiatric/Behavioral: Positive for sleep disturbance. Negative for depressed mood. The patient is not nervous/anxious.     PMFS History:  Patient Active Problem List   Diagnosis Date Noted  . Multiple closed fractures of metacarpal bone 07/25/2017  . Autoimmune disease ([place]) +ANA +Ro +La +RF oral ulcers nasal ulcers Raynaud photosensitivity SICCA  01/13/2017  . High risk medication use 01/13/2017  . Photosensitivity 01/13/2017  . Sjogren's syndrome with keratoconjunctivitis sicca ([place]) 01/13/2017  . Recurrent oral ulcers and nasal ulcers  01/13/2017  . Raynaud's disease without gangrene 01/13/2017  . Primary osteoarthritis of both hands 01/13/2017  . Primary osteoarthritis of both knees 01/13/2017  . Primary osteoarthritis of both feet 01/13/2017  . Osteopenia of multiple sites 01/13/2017  . History of hypertension 01/13/2017  .  History of hypothyroidism 01/13/2017  . History of gastroesophageal reflux (GERD)/ PUD 01/13/2017  . History of MRSA infection 01/13/2017  . Diarrhea 05/28/2013  . Family history of malignant neoplasm  of gastrointestinal tract 06/22/2011  . ADENOCARCINOMA, BREAST 05/02/2009  . CORONARY ARTERY DISEASE 05/02/2009  . DYSPHAGIA UNSPECIFIED 05/02/2009    Past Medical History:  Diagnosis Date  . ADENOCARCINOMA, BREAST 05/02/2009   Qualifier: Diagnosis of  By: Deatra Ina MD, Sandy Salaam   . Anxiety   . Arthritis   . Atrial fibrillation ([place])   . Autoimmune disorder ([place])    SJORGREN'S  . Breast cancer ([place])    right breast  . Cardiac arrhythmia due to congenital heart disease   . Chronic headaches   . CORONARY ARTERY DISEASE 05/02/2009   Qualifier: Diagnosis of  By: Deatra Ina MD, Sandy Salaam   . Depression   . Diarrhea 05/28/2013  . DYSPHAGIA UNSPECIFIED 05/02/2009   Qualifier: Diagnosis of  By: Deatra Ina MD, Sandy Salaam   . Family history of malignant neoplasm of gastrointestinal tract 06/22/2011   Brother with colon cancer   . GERD (gastroesophageal reflux disease)   . Hemorrhage of rectum and anus 06/22/2011  . History of hypothyroidism 01/13/2017  . History of MRSA infection 01/13/2017  . Hypertension   . Multiple closed fractures of metacarpal bone 07/25/2017  . Myocardial infarction ([place])    1990  . Osteopenia of multiple sites 01/13/2017  . Osteoporosis   . Photosensitivity 01/13/2017  . Primary osteoarthritis of both hands 01/13/2017  . Raynaud's disease without gangrene 01/13/2017  . Recurrent oral ulcers and nasal ulcers  01/13/2017  . Sjogren's syndrome with keratoconjunctivitis sicca ([place]) 01/13/2017  . Status post dilation of esophageal narrowing   . Thyroid disease   . Ulcer     Family History  Problem Relation Age of Onset  . Breast cancer Mother   . Lung cancer Mother   . Cancer Father        larynx  . Heart failure Father   . Heart disease Brother   . Colon cancer Brother   . Pseudochol deficiency Other   . Breast cancer Sister   . Cancer Sister        thyroid   . Breast cancer Maternal Grandmother   . Autoimmune disease Daughter   . Cancer Son        prostate   . Diabetes  Son   . Rheum arthritis Grandchild   . Esophageal cancer Neg Hx   . Rectal cancer Neg Hx    Past Surgical History:  Procedure Laterality Date  . APPENDECTOMY    . breast reconstuction     x 2  . CHOLECYSTECTOMY    . COLONOSCOPY    . double mastectomy    . PARTIAL HYSTERECTOMY  1990  . TIBIA FRACTURE SURGERY Right    with subsequent hardware removal  . UPPER GASTROINTESTINAL ENDOSCOPY    . WRIST FRACTURE SURGERY Left    Social History   Social History Narrative   1 cup of coffee daily   Immunization History  Administered Date(s) Administered  . PFIZER SARS-COV-2 Vaccination 09/03/2019, 09/28/2019  . Zoster Recombinat (Shingrix) 01/10/2018, 05/11/2018     Objective: Vital Signs: BP 110/68 (BP Location: Left Arm, Patient Position: Sitting, Cuff Size: Normal)   Pulse 60   Resp 18   Ht 5' 5.5" (1.664 m)   Wt 150 lb 9.6 oz (68.3 kg)   BMI 24.68 kg/m    Physical Exam Vitals and  nursing note reviewed.  Constitutional:      Appearance: She is well-developed.  HENT:     Head: Normocephalic and atraumatic.  Eyes:     Conjunctiva/sclera: Conjunctivae normal.  Pulmonary:     Effort: Pulmonary effort is normal.  Abdominal:     General: Bowel sounds are normal.     Palpations: Abdomen is soft.  Musculoskeletal:     Cervical back: Normal range of motion.  Lymphadenopathy:     Cervical: No cervical adenopathy.  Skin:    General: Skin is warm and dry.     Capillary Refill: Capillary refill takes less than 2 seconds.  Neurological:     Mental Status: She is alert and oriented to person, place, and time.  Psychiatric:        Behavior: Behavior normal.      Musculoskeletal Exam: C-spine, thoracic spine, lumbar spine have good range of motion.  No midline spinal tenderness.  She is tenderness over the right SI joint.  Shoulder joints have slightly limited range of motion with some discomfort bilaterally.  Elbow joints have good range of motion with no discomfort.  Limited  range of motion of both wrist joints.  She has synovial thickening of the right second and third MCP joints.  Tenderness and synovitis of the right second PIP joint.  She has PIP and DIP thickening consistent with osteoarthritis of both hands.  Hip joints have good range of motion with no discomfort.  Knee joints have good range of motion with no warmth or effusion.  Ankle joints have good range of motion with no tenderness or inflammation.  She has pedal edema bilaterally.  Overcrowding of toes noted.  CDAI Exam: CDAI Score: -- Patient Global: --; Provider Global: -- Swollen: 1 ; Tender: 2  Joint Exam 12/13/2019      Right  Left  PIP 2  Swollen Tender     Sacroiliac   Tender        Investigation: No additional findings.  Imaging: XR Pelvis 1-2 Views  Result Date: 12/13/2019 Right hip appears higher than the left hip.  No hip joint narrowing was noted.  Osteoarthritic changes were noted in bilateral SI joints.  Degenerative changes of the lumbar spine and scoliosis was noted.  No fracture was noted. Impression: Osteoarthritic changes in the SI joints were noted.   Recent Labs: Lab Results  Component Value Date   WBC 3.8 09/26/2018   HGB 14.1 09/26/2018   PLT 213 09/26/2018   NA 138 09/26/2018   K 4.1 09/26/2018   CL 103 09/26/2018   CO2 28 09/26/2018   GLUCOSE 57 (L) 09/26/2018   BUN 21 09/26/2018   CREATININE 0.92 09/26/2018   BILITOT 0.4 09/26/2018   ALKPHOS 79 12/13/2017   AST 30 09/26/2018   ALT 14 09/26/2018   PROT 7.5 09/26/2018   PROT 7.7 09/26/2018   ALBUMIN 3.6 12/13/2017   CALCIUM 10.3 09/26/2018   GFRAA 69 09/26/2018    Speciality Comments: PLQ Eye Exam: 06/29/2019 WNL @ [facility].  Procedures:  Sacroiliac Joint Inj on 12/13/2019 10:40 AM Indications: pain Details: 27 G 1.5 in needle, posterior approach Medications: 1 mL lidocaine 1 %; 40 mg triamcinolone acetonide 40 MG/ML Aspirate: 0 mL Outcome: tolerated well, no immediate  complications Procedure, treatment alternatives, risks and benefits explained, specific risks discussed. Consent was given by the patient. Immediately prior to procedure a time out was called to verify the correct patient, procedure, equipment, support staff and site/side  marked as required. Patient was prepped and draped in the usual sterile fashion.     Allergies: Codeine, Robinul [glycopyrrolate], and Sulfamethoxazole-trimethoprim   Assessment / Plan:     Visit Diagnoses: Autoimmune disease ([place]) +ANA +Ro +La +RF oral ulcers nasal ulcers Raynaud photosensitivity SICCA  - She has not had any signs or symptoms of a flare recently.  She is clinically doing well on Plaquenil 200 mg 1 tablet by mouth twice daily Monday through Friday.  Her dose of Plaquenil was increased at her last office visit on 09/17/2019.  She is tolerating the higher dose of Plaquenil without any side effects.  She continues to have tenderness and synovitis of the right second PIP joint.  Overall her discomfort and joint stiffness has improved on the higher dose of Plaquenil.  She is also been able to be more active and has been gardening up to 5 hours/day.  She has not had any recent rashes but continues to have photosensitivity.  We discussed the importance of wearing sunscreen and sun protective clothing on a daily basis.  She has chronic sicca symptoms which have been tolerable.  She uses Restasis eyedrops as needed.  She has intermittent symptoms of Raynaud's in her fingers and toes but no digital ulcerations or signs of gangrene were noted.  She will continue taking Norvasc 2.5 mg 1 tablet daily and aspirin 325 mg daily.  She has recurrent nasal ulcerations but has not had any oral ulcerations recently.  Lab work from 07/17/2019 was reviewed today in the office.  Double-stranded DNA was negative and complements were within normal limits.  We will repeat autoimmune lab work today.  She will continue taking Plaquenil 200 mg 1 tablet  by mouth twice daily Monday through Friday.  She does not need any refills at this time.  She was advised to notify us if she develops any new or worsening symptoms.  She will follow-up in the office in 5 months.  Plan: CBC with Differential/Platelet, COMPLETE METABOLIC PANEL WITH GFR, Urinalysis, Routine w reflex microscopic, Anti-DNA antibody, double-stranded, C3 and C4, Sedimentation rate  High risk medication use - PLQ 200 mg 1 tablet by mouth BID Monday through Friday.PLQ Eye Exam: 06/29/2019.  CBC and CMP were drawn today to monitor for drug toxicity.- Plan: CBC with Differential/Platelet, COMPLETE METABOLIC PANEL WITH GFR  Sjogren's syndrome with keratoconjunctivitis sicca ([facility]) -RF positive: She has chronic sicca symptoms which have been tolerable overall.  Over-the-counter products were discussed.  She uses Restasis eyedrops.  She will continue taking Plaquenil as prescribed.  We will check RF, SPEP, Ro, and Law antibodies today.  Plan: Serum protein electrophoresis with reflex, Sjogrens syndrome-A extractable nuclear antibody, Sjogrens syndrome-B extractable nuclear antibody, Rheumatoid factor  Raynaud's disease without gangrene: She has intermittent symptoms in her fingers and toes.  No digital ulcerations or signs of gangrene were noted.  We discussed the importance of avoiding triggers.  She continues to take aspirin 325 mg daily.  She will continue taking amlodipine 2.5 mg 1 tablet by mouth daily.  Primary osteoarthritis of both hands: She has PIP and DIP thickening consistent with osteoarthritis of both hands.  She has been experiencing increased pain in both hands which she attributes to gardening up to 4 to 5 hours/day.  The stiffness in both hands has improved.  Joint protection and muscle strengthening were discussed.  Primary osteoarthritis of both knees: She has good range of motion of both knee joints on exam.  No warmth or effusion was  noted.  Primary osteoarthritis of both feet:  She has PIP and DIP thickening consistent with osteoarthritis of both feet.  Overcrowding of toes noted.  We discussed the importance of wearing proper fitting shoes.  She has been experiencing some symptoms of neuropathy in her feet at night.  She was advised to follow-up with her PCP for further evaluation.  Age-related osteoporosis without current pathological fracture - 12/14/18 DEXA BMD 0.523 with T-score -2.9 1/3 right forearm.  She is prescribed Evista 60 mg 3 times weekly prescribed by her gynecologist.  She continues to take a vitamin D supplement as recommended.  She has not had any recent falls or fractures.  Chronic right SI joint pain -She presents today with right SI joint pain.  She has tenderness to palpation on exam.  She has been experiencing increased discomfort and stiffness in her lower back especially at night when lying on her back.  X-rays of the pelvis were obtained today.  Leg length discrepancy was noted.  She will be referred for a shoe lift.  The right SI joint was injected with cortisone today.  The procedure note was completed above.  Aftercare was discussed.  Plan: XR Pelvis 1-2 Views  Other medical conditions are listed as follows:  History of hypothyroidism  History of hypertension  History of gastroesophageal reflux (GERD)/ PUD  History of MRSA infection    Orders: Orders Placed This Encounter  Procedures  . Sacroiliac Joint Inj  . XR Pelvis 1-2 Views  . CBC with Differential/Platelet  . COMPLETE METABOLIC PANEL WITH GFR  . Urinalysis, Routine w reflex microscopic  . Anti-DNA antibody, double-stranded  . C3 and C4  . Sedimentation rate  . Serum protein electrophoresis with reflex  . Sjogrens syndrome-A extractable nuclear antibody  . Sjogrens syndrome-B extractable nuclear antibody  . Rheumatoid factor   No orders of the defined types were placed in this encounter.     Follow-Up Instructions: Return in about 5 months (around 05/14/2020) for  Autoimmune Disease, Sjogren's syndrome.   Ofilia Neas, PA-C  Note - This record has been created using Dragon software.  Chart creation errors have been sought, but may not always  have been located. Such creation errors do not reflect on  the standard of medical care.

## 2019-12-13 ENCOUNTER — Other Ambulatory Visit: Payer: Self-pay

## 2019-12-13 ENCOUNTER — Ambulatory Visit: Payer: Self-pay

## 2019-12-13 ENCOUNTER — Ambulatory Visit: Payer: PPO | Admitting: Physician Assistant

## 2019-12-13 ENCOUNTER — Encounter: Payer: Self-pay | Admitting: Physician Assistant

## 2019-12-13 VITALS — BP 110/68 | HR 60 | Resp 18 | Ht 65.5 in | Wt 150.6 lb

## 2019-12-13 DIAGNOSIS — G8929 Other chronic pain: Secondary | ICD-10-CM | POA: Diagnosis not present

## 2019-12-13 DIAGNOSIS — Z8719 Personal history of other diseases of the digestive system: Secondary | ICD-10-CM

## 2019-12-13 DIAGNOSIS — Z8614 Personal history of Methicillin resistant Staphylococcus aureus infection: Secondary | ICD-10-CM

## 2019-12-13 DIAGNOSIS — Z79899 Other long term (current) drug therapy: Secondary | ICD-10-CM | POA: Diagnosis not present

## 2019-12-13 DIAGNOSIS — M810 Age-related osteoporosis without current pathological fracture: Secondary | ICD-10-CM | POA: Diagnosis not present

## 2019-12-13 DIAGNOSIS — M19041 Primary osteoarthritis, right hand: Secondary | ICD-10-CM

## 2019-12-13 DIAGNOSIS — Z8679 Personal history of other diseases of the circulatory system: Secondary | ICD-10-CM

## 2019-12-13 DIAGNOSIS — Z8639 Personal history of other endocrine, nutritional and metabolic disease: Secondary | ICD-10-CM

## 2019-12-13 DIAGNOSIS — M19071 Primary osteoarthritis, right ankle and foot: Secondary | ICD-10-CM | POA: Diagnosis not present

## 2019-12-13 DIAGNOSIS — M170 Bilateral primary osteoarthritis of knee: Secondary | ICD-10-CM

## 2019-12-13 DIAGNOSIS — M359 Systemic involvement of connective tissue, unspecified: Secondary | ICD-10-CM

## 2019-12-13 DIAGNOSIS — M533 Sacrococcygeal disorders, not elsewhere classified: Secondary | ICD-10-CM | POA: Diagnosis not present

## 2019-12-13 DIAGNOSIS — M3501 Sicca syndrome with keratoconjunctivitis: Secondary | ICD-10-CM

## 2019-12-13 DIAGNOSIS — M19042 Primary osteoarthritis, left hand: Secondary | ICD-10-CM

## 2019-12-13 DIAGNOSIS — I7300 Raynaud's syndrome without gangrene: Secondary | ICD-10-CM | POA: Diagnosis not present

## 2019-12-13 DIAGNOSIS — M19072 Primary osteoarthritis, left ankle and foot: Secondary | ICD-10-CM

## 2019-12-13 MED ORDER — DICLOFENAC SODIUM 1 % EX GEL: CUTANEOUS | 2 refills | Status: AC

## 2019-12-13 NOTE — Addendum Note (Signed)
Addended by: Earnestine Mealing on: 12/13/2019 02:54 PM   Modules accepted: Orders

## 2019-12-17 ENCOUNTER — Ambulatory Visit: Payer: PPO | Admitting: Cardiology

## 2019-12-18 NOTE — Progress Notes (Signed)
WBC count is low-3.5.  rest of CBC WNL.  Please advise the patient to return to recheck CBC in 1 month.   Sodium borderline low.  Rest of CMP WNL.   Ro antibody remains positive. La antibody positive.  RF negative.  SPEP revealed low albumin but albumin was normal on CMP. Please add prealbumin quantification as recommended.  Complements WNL,  ESR WNL, and DsDNA negative.

## 2019-12-20 ENCOUNTER — Encounter: Payer: Self-pay | Admitting: Cardiology

## 2019-12-20 ENCOUNTER — Telehealth: Payer: Self-pay | Admitting: *Deleted

## 2019-12-20 ENCOUNTER — Other Ambulatory Visit: Payer: Self-pay

## 2019-12-20 ENCOUNTER — Ambulatory Visit: Payer: PPO | Admitting: Cardiology

## 2019-12-20 VITALS — BP 120/82 | HR 58 | Ht 65.5 in | Wt 147.4 lb

## 2019-12-20 DIAGNOSIS — I480 Paroxysmal atrial fibrillation: Secondary | ICD-10-CM | POA: Diagnosis not present

## 2019-12-20 DIAGNOSIS — I2510 Atherosclerotic heart disease of native coronary artery without angina pectoris: Secondary | ICD-10-CM | POA: Diagnosis not present

## 2019-12-20 DIAGNOSIS — Z79899 Other long term (current) drug therapy: Secondary | ICD-10-CM

## 2019-12-20 DIAGNOSIS — M3509 Sicca syndrome with other organ involvement: Secondary | ICD-10-CM | POA: Diagnosis not present

## 2019-12-20 DIAGNOSIS — M359 Systemic involvement of connective tissue, unspecified: Secondary | ICD-10-CM

## 2019-12-20 MED ORDER — LOSARTAN POTASSIUM 100 MG PO TABS: 100.0000 mg | ORAL_TABLET | Freq: Every day | ORAL | 3 refills | Status: DC

## 2019-12-20 MED ORDER — SOTALOL HCL 80 MG PO TABS: ORAL_TABLET | ORAL | 3 refills | Status: DC

## 2019-12-20 MED ORDER — SPIRONOLACTONE 25 MG PO TABS: 25.0000 mg | ORAL_TABLET | Freq: Every day | ORAL | 3 refills | Status: DC

## 2019-12-20 MED ORDER — HYDROXYCHLOROQUINE SULFATE 200 MG PO TABS: ORAL_TABLET | ORAL | 0 refills | Status: DC

## 2019-12-20 NOTE — Progress Notes (Signed)
Cardiology Office Note:    Date:  12/20/2019   ID:  Alice Medina, DOB 1941/02/12, MRN [id number]  PCP:  Shon Baton, MD  Cardiologist:  Candee Furbish, MD  Electrophysiologist:  None   Referring MD: Shon Baton, MD     History of Present Illness:    Alice Medina is a 79 y.o. female here for the follow-up of atrial fibrillation coronary disease lupus hypertension hyperlipidemia.  Previous patient of Dr. Thurman Coyer.  Has had in the past persistent cough.  Sjogren's syndrome.  Tick bites.  Rheumatology has been following.  She has not had any recurrence of atrial fibrillation for quite a while.  She has never been on anticoagulation.  She takes sotalol as an antiarrhythmic.  Brother has extensive coronary disease.  She has CAD with myocardial bridging.  Non-smoker.  Father died with heart failure.  Cancer.  Past Medical History:  Diagnosis Date  . ADENOCARCINOMA, BREAST 05/02/2009   Qualifier: Diagnosis of  By: Deatra Ina MD, Sandy Salaam   . Anxiety   . Arthritis   . Atrial fibrillation ([place])   . Autoimmune disorder ([place])    SJORGREN'S  . Breast cancer ([place])    right breast  . Cardiac arrhythmia due to congenital heart disease   . Chronic headaches   . CORONARY ARTERY DISEASE 05/02/2009   Qualifier: Diagnosis of  By: Deatra Ina MD, Sandy Salaam   . Depression   . Diarrhea 05/28/2013  . DYSPHAGIA UNSPECIFIED 05/02/2009   Qualifier: Diagnosis of  By: Deatra Ina MD, Sandy Salaam   . Family history of malignant neoplasm of gastrointestinal tract 06/22/2011   Brother with colon cancer   . GERD (gastroesophageal reflux disease)   . Hemorrhage of rectum and anus 06/22/2011  . History of hypothyroidism 01/13/2017  . History of MRSA infection 01/13/2017  . Hypertension   . Multiple closed fractures of metacarpal bone 07/25/2017  . Myocardial infarction ([place])    1990  . Osteopenia of multiple sites 01/13/2017  . Osteoporosis   . Photosensitivity 01/13/2017  . Primary osteoarthritis of both hands  01/13/2017  . Raynaud's disease without gangrene 01/13/2017  . Recurrent oral ulcers and nasal ulcers  01/13/2017  . Sjogren's syndrome with keratoconjunctivitis sicca ([place]) 01/13/2017  . Status post dilation of esophageal narrowing   . Thyroid disease   . Ulcer     Past Surgical History:  Procedure Laterality Date  . APPENDECTOMY    . breast reconstuction     x 2  . CHOLECYSTECTOMY    . COLONOSCOPY    . double mastectomy    . PARTIAL HYSTERECTOMY  1990  . TIBIA FRACTURE SURGERY Right    with subsequent hardware removal  . UPPER GASTROINTESTINAL ENDOSCOPY    . WRIST FRACTURE SURGERY Left     Current Medications: Current Meds  Medication Sig  . amLODipine (NORVASC) 2.5 MG tablet TAKE 1 TABLET BY MOUTH EVERY DAY  . aspirin 325 MG tablet Take 325 mg by mouth daily.  . Budesonide-Formoterol Fumarate (SYMBICORT IN) Inhale into the lungs as needed.   . cholecalciferol (VITAMIN D) 1000 UNITS tablet Take 1,000 Units by mouth daily.   . cyclobenzaprine (FLEXERIL) 10 MG tablet Take 10 mg by mouth at bedtime as needed for muscle spasms.   . cycloSPORINE (RESTASIS) 0.05 % ophthalmic emulsion Place 1 drop into both eyes 2 (two) times daily.   . diclofenac Sodium (VOLTAREN) 1 % GEL Apply 2-4 grams to affected joint 4 times daily as needed.  . DULoxetine (CYMBALTA) 60  MG capsule Take 60 mg by mouth daily.  . hydroxychloroquine (PLAQUENIL) 200 MG tablet Take 1 tablet (200 mg) po BID Monday through Friday only  . levothyroxine (SYNTHROID, LEVOTHROID) 50 MCG tablet Take 50 mcg by mouth daily.    Marland Kitchen LORazepam (ATIVAN) 2 MG tablet Take 2 mg by mouth at bedtime.   Marland Kitchen losartan (COZAAR) 100 MG tablet Take 1 tablet (100 mg total) by mouth daily.  . Multiple Vitamins-Minerals (MULTIVITAMIN WITH MINERALS) tablet Take 1 tablet by mouth daily.    . raloxifene (EVISTA) 60 MG tablet Take 60 mg by mouth daily. three times a week  . sotalol (BETAPACE) 80 MG tablet TAKE 1 TABLET(80 MG) BY MOUTH TWICE DAILY  .  spironolactone (ALDACTONE) 25 MG tablet Take 1 tablet (25 mg total) by mouth daily.  . [DISCONTINUED] losartan (COZAAR) 100 MG tablet Take 1 tablet (100 mg total) by mouth daily.  . [DISCONTINUED] sotalol (BETAPACE) 80 MG tablet TAKE 1 TABLET(80 MG) BY MOUTH TWICE DAILY  . [DISCONTINUED] spironolactone (ALDACTONE) 25 MG tablet Take 1 tablet (25 mg total) by mouth daily.     Allergies:   Codeine, Robinul [glycopyrrolate], and Sulfamethoxazole-trimethoprim   Social History   Socioeconomic History  . Marital status: Widowed    Spouse name: Not on file  . Number of children: 2  . Years of education: Not on file  . Highest education level: Not on file  Occupational History  . Occupation: retired Corporate treasurer  Tobacco Use  . Smoking status: Never Smoker  . Smokeless tobacco: Never Used  Substance and Sexual Activity  . Alcohol use: Not Currently  . Drug use: Never  . Sexual activity: Not on file  Other Topics Concern  . Not on file  Social History Narrative   1 cup of coffee daily   Social Determinants of Health   Financial Resource Strain:   . Difficulty of Paying Living Expenses:   Food Insecurity:   . Worried About Charity fundraiser in the Last Year:   . Arboriculturist in the Last Year:   Transportation Needs:   . Film/video editor (Medical):   Marland Kitchen Lack of Transportation (Non-Medical):   Physical Activity:   . Days of Exercise per Week:   . Minutes of Exercise per Session:   Stress:   . Feeling of Stress :   Social Connections:   . Frequency of Communication with Friends and Family:   . Frequency of Social Gatherings with Friends and Family:   . Attends Religious Services:   . Active Member of Clubs or Organizations:   . Attends Archivist Meetings:   Marland Kitchen Marital Status:      Family History: The patient's family history includes Autoimmune disease in her daughter; Breast cancer in her maternal grandmother, mother, and sister; Cancer in her father, sister, and  son; Colon cancer in her brother; Diabetes in her son; Heart disease in her brother; Heart failure in her father; Lung cancer in her mother; Pseudochol deficiency in an other family member; Rheum arthritis in her grandchild. There is no history of Esophageal cancer or Rectal cancer.  ROS:   Please see the history of present illness.     All other systems reviewed and are negative.  EKGs/Labs/Other Studies Reviewed:    The following studies were reviewed today: As below  EKG:  EKG is  ordered today.  The ekg ordered today demonstrates sinus bradycardia 58 first-degree AV block 256 ms QTC 426 nonspecific ST-T  wave changes  Recent Labs: 12/13/2019: ALT 15; BUN 21; Creat 0.84; Hemoglobin 13.6; Platelets 187; Potassium 4.5; Sodium 134  Recent Lipid Panel No results found for: CHOL, TRIG, HDL, CHOLHDL, VLDL, LDLCALC, LDLDIRECT  Physical Exam:    VS:  BP 120/82   Pulse (!) 58   Ht 5' 5.5" (1.664 m)   Wt 147 lb 6.4 oz (66.9 kg)   SpO2 98%   BMI 24.16 kg/m     Wt Readings from Last 3 Encounters:  12/20/19 147 lb 6.4 oz (66.9 kg)  12/13/19 150 lb 9.6 oz (68.3 kg)  09/17/19 153 lb 9.6 oz (69.7 kg)     GEN:  Well nourished, well developed in no acute distress HEENT: Normal NECK: No JVD; No carotid bruits LYMPHATICS: No lymphadenopathy CARDIAC: RRR, no murmurs, rubs, gallops RESPIRATORY:  Clear to auscultation without rales, wheezing or rhonchi  ABDOMEN: Soft, non-tender, non-distended MUSCULOSKELETAL:  No edema; No deformity  SKIN: Warm and dry, somewhat purple hue to toes.  Likely microcircular. NEUROLOGIC:  Alert and oriented x 3 PSYCHIATRIC:  Normal affect   ASSESSMENT:    1. Coronary artery disease involving native coronary artery of native heart without angina pectoris   2. PAF (paroxysmal atrial fibrillation) ([place])   3. Sjogren's syndrome with other organ involvement ([place])    PLAN:    In order of problems listed above:  Paroxysmal atrial fibrillation -Continues with  sotalol 80 mg twice daily as an antiarrhythmic.  Has been stable.  Has not been anticoagulated for years according to Dr. Thurman Coyer previous note.  No changes made.  Sotalol previously also 160 mg but this has been reduced because of bradycardia in the past. --Takes ASA 325 for pain.  She would like to stay on this dose.  Coronary artery disease with myocardial bridging -Stable, cardiac catheterization was performed in 1990 April 6.  Pharmacologic stress test in 2013 showed no ischemia.  Essential hypertension -Has had some hypotension at the past.  Has had a fall in the past.  Being careful.  Lower extremity edema -Fairly mild.  Did you wear compression stockings.  LDL 77 triglycerides 90 hemoglobin 13.6 creatinine 0.8 potassium 4.5 ALT 15 from outside labs. --Working in Programmer, multimedia bed. Better this year with stamina. Mild SOB with activty.   Falls  - has 1st degree AVB, sinking for a second, no frank syncope.  She is on sotalol.  We have to be careful with her conduction.  Overall she has not had any worrisome symptoms that would push me towards monitoring her or pacemaker.  Medication Adjustments/Labs and Tests Ordered: Current medicines are reviewed at length with the patient today.  Concerns regarding medicines are outlined above.  Orders Placed This Encounter  Procedures  . EKG 12-Lead   Meds ordered this encounter  Medications  . sotalol (BETAPACE) 80 MG tablet    Sig: TAKE 1 TABLET(80 MG) BY MOUTH TWICE DAILY    Dispense:  180 tablet    Refill:  3  . losartan (COZAAR) 100 MG tablet    Sig: Take 1 tablet (100 mg total) by mouth daily.    Dispense:  90 tablet    Refill:  3  . spironolactone (ALDACTONE) 25 MG tablet    Sig: Take 1 tablet (25 mg total) by mouth daily.    Dispense:  90 tablet    Refill:  3    Patient Instructions  Medication Instructions:   Your physician recommends that you continue on your current medications as directed.  Please refer to the Current Medication  list given to you today.  *If you need a refill on your cardiac medications before your next appointment, please call your pharmacy*   Follow-Up: At [facility], you and your health needs are our priority.  As part of our continuing mission to provide you with exceptional heart care, we have created designated Provider Care Teams.  These Care Teams include your primary Cardiologist (physician) and Advanced Practice Providers (APPs -  Physician Assistants and Nurse Practitioners) who all work together to provide you with the care you need, when you need it.  We recommend signing up for the patient portal called "MyChart".  Sign up information is provided on this After Visit Summary.  MyChart is used to connect with patients for Virtual Visits (Telemedicine).  Patients are able to view lab/test results, encounter notes, upcoming appointments, etc.  Non-urgent messages can be sent to your provider as well.   To learn more about what you can do with MyChart, go to [email or web address].    Your next appointment:   12 month(s)  The format for your next appointment:   In Person  Provider:   Candee Furbish, MD       Signed, Candee Furbish, MD  12/20/2019 11:16 AM    [place]

## 2019-12-20 NOTE — Telephone Encounter (Signed)
Refill request received via fax  Last Visit: 12/13/2019 Next Visit: 05/15/2020 Labs: 12/13/2019 WBC count is low-3.5. rest of CBC WNL. Please advise the patient to return to recheck CBC in 1 month.  Sodium borderline low. Rest of CMP WNL.  Eye exam: 06/29/2019 WNL  Current Dose per office note 12/13/2019: PLQ 200 mg 1 tablet by mouth BID Monday through Friday.  Okay to refill per Dr. Estanislado Pandy

## 2019-12-20 NOTE — Patient Instructions (Signed)
Medication Instructions:   Your physician recommends that you continue on your current medications as directed. Please refer to the Current Medication list given to you today.  *If you need a refill on your cardiac medications before your next appointment, please call your pharmacy*   Follow-Up: At [facility], you and your health needs are our priority.  As part of our continuing mission to provide you with exceptional heart care, we have created designated Provider Care Teams.  These Care Teams include your primary Cardiologist (physician) and Advanced Practice Providers (APPs -  Physician Assistants and Nurse Practitioners) who all work together to provide you with the care you need, when you need it.  We recommend signing up for the patient portal called "MyChart".  Sign up information is provided on this After Visit Summary.  MyChart is used to connect with patients for Virtual Visits (Telemedicine).  Patients are able to view lab/test results, encounter notes, upcoming appointments, etc.  Non-urgent messages can be sent to your provider as well.   To learn more about what you can do with MyChart, go to [email or web address].    Your next appointment:   12 month(s)  The format for your next appointment:   In Person  Provider:   Mark Skains, MD    

## 2019-12-21 LAB — CBC WITH DIFFERENTIAL/PLATELET
Absolute Monocytes: 553 cells/uL (ref 200–950)
Basophils Absolute: 21 cells/uL (ref 0–200)
Basophils Relative: 0.6 %
Eosinophils Absolute: 88 cells/uL (ref 15–500)
Eosinophils Relative: 2.5 %
HCT: 41.7 % (ref 35.0–45.0)
Hemoglobin: 13.6 g/dL (ref 11.7–15.5)
Lymphs Abs: 1176 cells/uL (ref 850–3900)
MCH: 30.8 pg (ref 27.0–33.0)
MCHC: 32.6 g/dL (ref 32.0–36.0)
MCV: 94.6 fL (ref 80.0–100.0)
MPV: 10.3 fL (ref 7.5–12.5)
Monocytes Relative: 15.8 %
Neutro Abs: 1663 cells/uL (ref 1500–7800)
Neutrophils Relative %: 47.5 %
Platelets: 187 10*3/uL (ref 140–400)
RBC: 4.41 10*6/uL (ref 3.80–5.10)
RDW: 12.2 % (ref 11.0–15.0)
Total Lymphocyte: 33.6 %
WBC: 3.5 10*3/uL — ABNORMAL LOW (ref 3.8–10.8)

## 2019-12-21 LAB — COMPLETE METABOLIC PANEL WITH GFR
AG Ratio: 1.2 (calc) (ref 1.0–2.5)
ALT: 15 U/L (ref 6–29)
AST: 28 U/L (ref 10–35)
Albumin: 3.8 g/dL (ref 3.6–5.1)
Alkaline phosphatase (APISO): 67 U/L (ref 37–153)
BUN: 21 mg/dL (ref 7–25)
CO2: 25 mmol/L (ref 20–32)
Calcium: 9.9 mg/dL (ref 8.6–10.4)
Chloride: 102 mmol/L (ref 98–110)
Creat: 0.84 mg/dL (ref 0.60–0.93)
GFR, Est African American: 77 mL/min/{1.73_m2} (ref >=60)
GFR, Est Non African American: 66 mL/min/{1.73_m2} (ref >=60)
Globulin: 3.2 g/dL (calc) (ref 1.9–3.7)
Glucose, Bld: 98 mg/dL (ref 65–99)
Potassium: 4.5 mmol/L (ref 3.5–5.3)
Sodium: 134 mmol/L — ABNORMAL LOW (ref 135–146)
Total Bilirubin: 0.4 mg/dL (ref 0.2–1.2)
Total Protein: 7 g/dL (ref 6.1–8.1)

## 2019-12-21 LAB — C3 AND C4
C3 Complement: 122 mg/dL (ref 83–193)
C4 Complement: 25 mg/dL (ref 15–57)

## 2019-12-21 LAB — TEST AUTHORIZATION

## 2019-12-21 LAB — PROTEIN ELECTROPHORESIS, SERUM, WITH REFLEX
Albumin ELP: 3.6 g/dL — ABNORMAL LOW (ref 3.8–4.8)
Alpha 1: 0.3 g/dL (ref 0.2–0.3)
Alpha 2: 0.7 g/dL (ref 0.5–0.9)
Beta 2: 0.5 g/dL (ref 0.2–0.5)
Beta Globulin: 0.5 g/dL (ref 0.4–0.6)
Gamma Globulin: 1.5 g/dL (ref 0.8–1.7)
Total Protein: 7.2 g/dL (ref 6.1–8.1)

## 2019-12-21 LAB — URINALYSIS, ROUTINE W REFLEX MICROSCOPIC
Bilirubin Urine: NEGATIVE
Glucose, UA: NEGATIVE
Hgb urine dipstick: NEGATIVE
Ketones, ur: NEGATIVE
Leukocytes,Ua: NEGATIVE
Nitrite: NEGATIVE
Protein, ur: NEGATIVE
Specific Gravity, Urine: 1.013 (ref 1.001–1.03)
pH: 5.5 (ref 5.0–8.0)

## 2019-12-21 LAB — SEDIMENTATION RATE: Sed Rate: 28 mm/h (ref 0–30)

## 2019-12-21 LAB — SJOGRENS SYNDROME-A EXTRACTABLE NUCLEAR ANTIBODY: SSA (Ro) (ENA) Antibody, IgG: >8 AI — AB

## 2019-12-21 LAB — SJOGRENS SYNDROME-B EXTRACTABLE NUCLEAR ANTIBODY: SSB (La) (ENA) Antibody, IgG: 1.4 AI — AB

## 2019-12-21 LAB — ANTI-DNA ANTIBODY, DOUBLE-STRANDED: ds DNA Ab: 1 IU/mL

## 2019-12-21 LAB — RHEUMATOID FACTOR: Rheumatoid fact SerPl-aCnc: <14 IU/mL (ref <14)

## 2019-12-21 LAB — PREALBUMIN: Prealbumin: 26 mg/dL (ref 17–34)

## 2019-12-21 NOTE — Progress Notes (Signed)
Prealbumin WNL.

## 2020-01-08 ENCOUNTER — Ambulatory Visit: Payer: PPO | Admitting: Podiatry

## 2020-01-16 ENCOUNTER — Other Ambulatory Visit: Payer: Self-pay | Admitting: *Deleted

## 2020-01-16 DIAGNOSIS — Z79899 Other long term (current) drug therapy: Secondary | ICD-10-CM

## 2020-01-16 LAB — CBC WITH DIFFERENTIAL/PLATELET
Absolute Monocytes: 435 cells/uL (ref 200–950)
Basophils Absolute: 10 cells/uL (ref 0–200)
Basophils Relative: 0.3 %
Eosinophils Absolute: 82 cells/uL (ref 15–500)
Eosinophils Relative: 2.4 %
HCT: 40.2 % (ref 35.0–45.0)
Hemoglobin: 13.2 g/dL (ref 11.7–15.5)
Lymphs Abs: 1448 cells/uL (ref 850–3900)
MCH: 31.3 pg (ref 27.0–33.0)
MCHC: 32.8 g/dL (ref 32.0–36.0)
MCV: 95.3 fL (ref 80.0–100.0)
MPV: 9.7 fL (ref 7.5–12.5)
Monocytes Relative: 12.8 %
Neutro Abs: 1425 cells/uL — ABNORMAL LOW (ref 1500–7800)
Neutrophils Relative %: 41.9 %
Platelets: 194 10*3/uL (ref 140–400)
RBC: 4.22 10*6/uL (ref 3.80–5.10)
RDW: 12.6 % (ref 11.0–15.0)
Total Lymphocyte: 42.6 %
WBC: 3.4 10*3/uL — ABNORMAL LOW (ref 3.8–10.8)

## 2020-01-17 ENCOUNTER — Ambulatory Visit (INDEPENDENT_AMBULATORY_CARE_PROVIDER_SITE_OTHER): Payer: PPO

## 2020-01-17 ENCOUNTER — Other Ambulatory Visit: Payer: Self-pay

## 2020-01-17 ENCOUNTER — Ambulatory Visit: Payer: PPO | Admitting: Podiatry

## 2020-01-17 DIAGNOSIS — M79672 Pain in left foot: Secondary | ICD-10-CM

## 2020-01-17 DIAGNOSIS — M2170 Unequal limb length (acquired), unspecified site: Secondary | ICD-10-CM

## 2020-01-17 DIAGNOSIS — M79671 Pain in right foot: Secondary | ICD-10-CM

## 2020-01-17 NOTE — Progress Notes (Signed)
Low WBC count noted which is a stable.  We will continue to monitor.  No change in therapy advised.

## 2020-01-18 ENCOUNTER — Encounter: Payer: Self-pay | Admitting: Podiatry

## 2020-01-18 NOTE — Progress Notes (Signed)
Subjective:  Patient ID: Alice Medina, female    DOB: 19-Oct-1940,  MRN: [id number]  Chief Complaint  Patient presents with  . Foot Pain    pt is here to talk about foot pain bil, possible arthritis of both feet, pt states that pain is elevated to the touch.    79 y.o. female presents with the above complaint.  Patient presents with complaint of low ligament discrepancy on the right side.  Patient states he been on for 10+ years.  She started developing osteoarthritic changes in the other joints because of the limited discrepancy.  Patient would like to get some sort of the shoes to help give her a little bit of her left.  She denies any other acute complaints.  She would like to discuss orthotics management.  She has not seen anyone else prior to seeing me.  She had to get a hip surgery done because of the osteoarthritic changes due to limb length discrepancy on the right side being longer than the left side.  Her pain is dull achy in nature   Review of Systems: Negative except as noted in the HPI. Denies N/V/F/Ch.  Past Medical History:  Diagnosis Date  . ADENOCARCINOMA, BREAST 05/02/2009   Qualifier: Diagnosis of  By: Deatra Ina MD, Sandy Salaam   . Anxiety   . Arthritis   . Atrial fibrillation ([place])   . Autoimmune disorder ([place])    SJORGREN'S  . Breast cancer ([place])    right breast  . Cardiac arrhythmia due to congenital heart disease   . Chronic headaches   . CORONARY ARTERY DISEASE 05/02/2009   Qualifier: Diagnosis of  By: Deatra Ina MD, Sandy Salaam   . Depression   . Diarrhea 05/28/2013  . DYSPHAGIA UNSPECIFIED 05/02/2009   Qualifier: Diagnosis of  By: Deatra Ina MD, Sandy Salaam   . Family history of malignant neoplasm of gastrointestinal tract 06/22/2011   Brother with colon cancer   . GERD (gastroesophageal reflux disease)   . Hemorrhage of rectum and anus 06/22/2011  . History of hypothyroidism 01/13/2017  . History of MRSA infection 01/13/2017  . Hypertension   . Multiple closed fractures  of metacarpal bone 07/25/2017  . Myocardial infarction ([place])    1990  . Osteopenia of multiple sites 01/13/2017  . Osteoporosis   . Photosensitivity 01/13/2017  . Primary osteoarthritis of both hands 01/13/2017  . Raynaud's disease without gangrene 01/13/2017  . Recurrent oral ulcers and nasal ulcers  01/13/2017  . Sjogren's syndrome with keratoconjunctivitis sicca ([place]) 01/13/2017  . Status post dilation of esophageal narrowing   . Thyroid disease   . Ulcer     Current Outpatient Medications:  .  amLODipine (NORVASC) 2.5 MG tablet, TAKE 1 TABLET BY MOUTH EVERY DAY, Disp: 90 tablet, Rfl: 3 .  aspirin 325 MG tablet, Take 325 mg by mouth daily., Disp: , Rfl:  .  Budesonide-Formoterol Fumarate (SYMBICORT IN), Inhale into the lungs as needed. , Disp: , Rfl:  .  cholecalciferol (VITAMIN D) 1000 UNITS tablet, Take 1,000 Units by mouth daily. , Disp: , Rfl:  .  cyclobenzaprine (FLEXERIL) 10 MG tablet, Take 10 mg by mouth at bedtime as needed for muscle spasms. , Disp: , Rfl:  .  cycloSPORINE (RESTASIS) 0.05 % ophthalmic emulsion, Place 1 drop into both eyes 2 (two) times daily. , Disp: , Rfl:  .  diclofenac Sodium (VOLTAREN) 1 % GEL, Apply 2-4 grams to affected joint 4 times daily as needed., Disp: 400 g, Rfl: 2 .  DULoxetine (CYMBALTA) 60 MG capsule, Take 60 mg by mouth daily., Disp: , Rfl:  .  hydroxychloroquine (PLAQUENIL) 200 MG tablet, Take 1 tablet (200 mg) po BID Monday through Friday only, Disp: 120 tablet, Rfl: 0 .  levothyroxine (SYNTHROID, LEVOTHROID) 50 MCG tablet, Take 50 mcg by mouth daily.  , Disp: , Rfl:  .  LORazepam (ATIVAN) 2 MG tablet, Take 2 mg by mouth at bedtime. , Disp: , Rfl:  .  losartan (COZAAR) 100 MG tablet, Take 1 tablet (100 mg total) by mouth daily., Disp: 90 tablet, Rfl: 3 .  Multiple Vitamins-Minerals (MULTIVITAMIN WITH MINERALS) tablet, Take 1 tablet by mouth daily.  , Disp: , Rfl:  .  raloxifene (EVISTA) 60 MG tablet, Take 60 mg by mouth daily. three times a week,  Disp: , Rfl:  .  sotalol (BETAPACE) 80 MG tablet, TAKE 1 TABLET(80 MG) BY MOUTH TWICE DAILY, Disp: 180 tablet, Rfl: 3 .  spironolactone (ALDACTONE) 25 MG tablet, Take 1 tablet (25 mg total) by mouth daily., Disp: 90 tablet, Rfl: 3  Social History   Tobacco Use  Smoking Status Never Smoker  Smokeless Tobacco Never Used    Allergies  Allergen Reactions  . Codeine Other (See Comments)    Made stomach spasm  . Robinul [Glycopyrrolate] Other (See Comments)    AFIB  . Sulfamethoxazole-Trimethoprim Other (See Comments)    Peripheral neuropathy in hands   Objective:  There were no vitals filed for this visit. There is no height or weight on file to calculate BMI. Constitutional Well developed. Well nourished.  Vascular Dorsalis pedis pulses palpable bilaterally. Posterior tibial pulses palpable bilaterally. Capillary refill normal to all digits.  No cyanosis or clubbing noted. Pedal hair growth normal.  Neurologic Normal speech. Oriented to person, place, and time. Epicritic sensation to light touch grossly present bilaterally.  Dermatologic Nails well groomed and normal in appearance. No open wounds. No skin lesions.  Orthopedic:  Clinical evaluation of limb length shows the right side about half a centimeter to 0.75 cm shorter than the left side.  No pain on palpation to the Achilles tendon, peroneal tendon, posterior tibial tendon, ATFL.  No arch pain or heel pain noted.   Radiographs: 3 views of skeletally mature adult bilateral feet: Osteoarthritic changes noted at the midfoot.  Mild osteoarthritic changes noted the first metatarsophalangeal joint as well as ankle joint.  Mild plantar heel spurring noted.  No other bony abnormalities identified.  Assessment:   1. Foot pain, bilateral   2. Acquired unequal limb length    Plan:  Patient was evaluated and treated and all questions answered.  Limb length discrepancy with short limb right -I explained patient the etiology of  limb length discrepancy and various treatment options were discussed.  Clinically patient has about 0.75 cm of midline discrepancy which does not tend to make any adjustments clinically however patient does have history of osteoarthritic changes in the hip and the knees due to the shorter limb on the right side.  Patient would like to discuss orthotics management and a heel lift to cannot take the stress off of the right foot.   -She will be scheduled see Rick for custom orthosis with heel left on the right side  Return for See Liliane Channel for orthotics ASAP.

## 2020-01-31 ENCOUNTER — Other Ambulatory Visit: Payer: Self-pay | Admitting: Podiatry

## 2020-01-31 DIAGNOSIS — M2170 Unequal limb length (acquired), unspecified site: Secondary | ICD-10-CM

## 2020-02-04 ENCOUNTER — Other Ambulatory Visit: Payer: Self-pay

## 2020-02-04 ENCOUNTER — Ambulatory Visit: Payer: PPO | Admitting: Orthotics

## 2020-02-04 DIAGNOSIS — M2170 Unequal limb length (acquired), unspecified site: Secondary | ICD-10-CM

## 2020-02-04 DIAGNOSIS — M79672 Pain in left foot: Secondary | ICD-10-CM

## 2020-02-04 NOTE — Progress Notes (Signed)
Gave patient lift for RIGHT foot; will then decide to spend the money of foot orthotics.

## 2020-02-05 ENCOUNTER — Other Ambulatory Visit: Payer: Self-pay | Admitting: Cardiology

## 2020-02-18 DIAGNOSIS — E039 Hypothyroidism, unspecified: Secondary | ICD-10-CM | POA: Diagnosis not present

## 2020-02-18 DIAGNOSIS — M797 Fibromyalgia: Secondary | ICD-10-CM | POA: Diagnosis not present

## 2020-02-18 DIAGNOSIS — I130 Hypertensive heart and chronic kidney disease with heart failure and stage 1 through stage 4 chronic kidney disease, or unspecified chronic kidney disease: Secondary | ICD-10-CM | POA: Diagnosis not present

## 2020-02-18 DIAGNOSIS — M3210 Systemic lupus erythematosus, organ or system involvement unspecified: Secondary | ICD-10-CM | POA: Diagnosis not present

## 2020-02-18 DIAGNOSIS — F418 Other specified anxiety disorders: Secondary | ICD-10-CM | POA: Diagnosis not present

## 2020-02-25 DIAGNOSIS — I10 Essential (primary) hypertension: Secondary | ICD-10-CM | POA: Diagnosis not present

## 2020-03-03 DIAGNOSIS — I10 Essential (primary) hypertension: Secondary | ICD-10-CM | POA: Diagnosis not present

## 2020-03-16 ENCOUNTER — Other Ambulatory Visit: Payer: Self-pay | Admitting: Rheumatology

## 2020-03-16 DIAGNOSIS — Z79899 Other long term (current) drug therapy: Secondary | ICD-10-CM

## 2020-03-16 DIAGNOSIS — M359 Systemic involvement of connective tissue, unspecified: Secondary | ICD-10-CM

## 2020-03-17 NOTE — Telephone Encounter (Signed)
Last Visit: 12/13/2019 Next Visit: 05/15/2020 Labs: 12/13/2019 WBC count is low-3.5. rest of CBC WNL. Please advise the patient to return to recheck CBC in 1 month.  Sodium borderline low. Rest of CMP WNL.  Eye exam: 06/29/2019 WNL  Current Dose per office note 12/13/2019: PLQ 200 mg 1 tablet by mouth BID Monday through Friday.  Okay to refill PLQ?

## 2020-04-07 ENCOUNTER — Other Ambulatory Visit: Payer: Self-pay

## 2020-04-07 ENCOUNTER — Ambulatory Visit (HOSPITAL_COMMUNITY)
Admission: RE | Admit: 2020-04-07 | Discharge: 2020-04-07 | Disposition: A | Discharge status: Home / Self Care | Payer: PPO | Source: Ambulatory Visit | Attending: Physician Assistant | Admitting: Physician Assistant

## 2020-04-07 ENCOUNTER — Telehealth: Payer: Self-pay | Admitting: Cardiology

## 2020-04-07 VITALS — BP 176/90 | HR 56 | Ht 65.5 in | Wt 146.4 lb

## 2020-04-07 DIAGNOSIS — D6869 Other thrombophilia: Secondary | ICD-10-CM | POA: Diagnosis not present

## 2020-04-07 DIAGNOSIS — I10 Essential (primary) hypertension: Secondary | ICD-10-CM | POA: Insufficient documentation

## 2020-04-07 DIAGNOSIS — I491 Atrial premature depolarization: Secondary | ICD-10-CM | POA: Diagnosis not present

## 2020-04-07 DIAGNOSIS — Z7982 Long term (current) use of aspirin: Secondary | ICD-10-CM | POA: Insufficient documentation

## 2020-04-07 DIAGNOSIS — M3500 Sicca syndrome, unspecified: Secondary | ICD-10-CM | POA: Insufficient documentation

## 2020-04-07 DIAGNOSIS — M810 Age-related osteoporosis without current pathological fracture: Secondary | ICD-10-CM | POA: Insufficient documentation

## 2020-04-07 DIAGNOSIS — M8580 Other specified disorders of bone density and structure, unspecified site: Secondary | ICD-10-CM | POA: Diagnosis not present

## 2020-04-07 DIAGNOSIS — K219 Gastro-esophageal reflux disease without esophagitis: Secondary | ICD-10-CM | POA: Insufficient documentation

## 2020-04-07 DIAGNOSIS — Z79899 Other long term (current) drug therapy: Secondary | ICD-10-CM | POA: Diagnosis not present

## 2020-04-07 DIAGNOSIS — E039 Hypothyroidism, unspecified: Secondary | ICD-10-CM | POA: Insufficient documentation

## 2020-04-07 DIAGNOSIS — Z853 Personal history of malignant neoplasm of breast: Secondary | ICD-10-CM | POA: Insufficient documentation

## 2020-04-07 DIAGNOSIS — I480 Paroxysmal atrial fibrillation: Secondary | ICD-10-CM | POA: Diagnosis not present

## 2020-04-07 DIAGNOSIS — Z79890 Hormone replacement therapy (postmenopausal): Secondary | ICD-10-CM | POA: Insufficient documentation

## 2020-04-07 DIAGNOSIS — I2510 Atherosclerotic heart disease of native coronary artery without angina pectoris: Secondary | ICD-10-CM | POA: Diagnosis not present

## 2020-04-07 DIAGNOSIS — E785 Hyperlipidemia, unspecified: Secondary | ICD-10-CM | POA: Insufficient documentation

## 2020-04-07 DIAGNOSIS — F329 Major depressive disorder, single episode, unspecified: Secondary | ICD-10-CM | POA: Insufficient documentation

## 2020-04-07 HISTORY — DX: Atrial premature depolarization: I49.1

## 2020-04-07 NOTE — Telephone Encounter (Signed)
I spoke with patient. She has history of PAF.  She was sitting in recliner this past Saturday around midnight.  Start feeling weak. BP was elevated and machine showed possible afib.  Had brief episode of left jaw pain.  She thinks she has remained in afib since this time.  No shortness of breath or chest pain now.  Feels weak.  Last BP and heart rate noted below. She would like appointment today.  Will check with afib clinic.  Reviewed with afib clinic and they can see patient today at 2:30.  I spoke with patient and she would like this appointment.  Location and parking information provided to patient

## 2020-04-07 NOTE — Progress Notes (Signed)
Primary Care Physician: Shon Baton, MD Primary Cardiologist: Dr Marlou Porch Primary Electrophysiologist: none Referring Physician: [facility]/Dr Marlou Porch   Alice Medina is a 79 y.o. female with a history of CAD with myocardial bridging, Sjogren's syndrome, HTN, HLD, and atrial fibrillation who presents for consultation in the [facility]. The patient was initially diagnosed with atrial fibrillation remotely and has been maintained on sotalol. Patient is not on anticoagulation for a CHADS2VASC score of 5. She reports that she had two episodes of sudden onset fatigue on 9/8 and 9/11 which both lasted several hours. Her BP machine at home showed an irregular pulse with controlled heart rates. She denies dizziness or presyncope. The first episode occurred while she was working out in the yard.   Today, she denies symptoms of palpitations, chest pain, shortness of breath, orthopnea, PND, lower extremity edema, dizziness, presyncope, syncope, snoring, daytime somnolence, bleeding, or neurologic sequela. The patient is tolerating medications without difficulties and is otherwise without complaint today.    Atrial Fibrillation Risk Factors:  she does not have symptoms or diagnosis of sleep apnea. she does not have a history of rheumatic fever.   she has a BMI of Body mass index is 23.99 kg/m.Marland Kitchen Filed Weights   04/07/20 1434  Weight: 66.4 kg    Family History  Problem Relation Age of Onset   Breast cancer Mother    Lung cancer Mother    Cancer Father        larynx   Heart failure Father    Heart disease Brother    Colon cancer Brother    Pseudochol deficiency Other    Breast cancer Sister    Cancer Sister        thyroid    Breast cancer Maternal Grandmother    Autoimmune disease Daughter    Cancer Son        prostate    Diabetes Son    Rheum arthritis Grandchild    Esophageal cancer Neg Hx    Rectal cancer Neg Hx      Atrial  Fibrillation Management history:  Previous antiarrhythmic drugs: sotalol Previous cardioversions: none Previous ablations: none CHADS2VASC score: 5 Anticoagulation history: none   Past Medical History:  Diagnosis Date   ADENOCARCINOMA, BREAST 05/02/2009   Qualifier: Diagnosis of  By: Deatra Ina MD, Sandy Salaam    Anxiety    Arthritis    Atrial fibrillation ([place])    Autoimmune disorder ([place])    SJORGREN'S   Breast cancer ([place])    right breast   Cardiac arrhythmia due to congenital heart disease    Chronic headaches    CORONARY ARTERY DISEASE 05/02/2009   Qualifier: Diagnosis of  By: Deatra Ina MD, Sandy Salaam    Depression    Diarrhea 05/28/2013   DYSPHAGIA UNSPECIFIED 05/02/2009   Qualifier: Diagnosis of  By: Deatra Ina MD, Sandy Salaam    Family history of malignant neoplasm of gastrointestinal tract 06/22/2011   Brother with colon cancer    GERD (gastroesophageal reflux disease)    Hemorrhage of rectum and anus 06/22/2011   History of hypothyroidism 01/13/2017   History of MRSA infection 01/13/2017   Hypertension    Multiple closed fractures of metacarpal bone 07/25/2017   Myocardial infarction ([place])    1990   Osteopenia of multiple sites 01/13/2017   Osteoporosis    Photosensitivity 01/13/2017   Primary osteoarthritis of both hands 01/13/2017   Raynaud's disease without gangrene 01/13/2017   Recurrent oral ulcers and nasal ulcers  01/13/2017  Sjogren's syndrome with keratoconjunctivitis sicca ([place]) 01/13/2017   Status post dilation of esophageal narrowing    Thyroid disease    Ulcer    Past Surgical History:  Procedure Laterality Date   APPENDECTOMY     breast reconstuction     x 2   CHOLECYSTECTOMY     COLONOSCOPY     double mastectomy     PARTIAL HYSTERECTOMY  1990   TIBIA FRACTURE SURGERY Right    with subsequent hardware removal   UPPER GASTROINTESTINAL ENDOSCOPY     WRIST FRACTURE SURGERY Left     Current Outpatient Medications    Medication Sig Dispense Refill   amLODipine (NORVASC) 2.5 MG tablet TAKE 1 TABLET BY MOUTH EVERY DAY 90 tablet 3   aspirin 325 MG tablet Take 325 mg by mouth daily.     Budesonide-Formoterol Fumarate (SYMBICORT IN) Inhale into the lungs as needed.      cholecalciferol (VITAMIN D) 1000 UNITS tablet Take 1,000 Units by mouth daily.      cyclobenzaprine (FLEXERIL) 10 MG tablet Take 10 mg by mouth at bedtime as needed for muscle spasms.      cycloSPORINE (RESTASIS) 0.05 % ophthalmic emulsion Place 1 drop into both eyes 2 (two) times daily.      diclofenac Sodium (VOLTAREN) 1 % GEL Apply 2-4 grams to affected joint 4 times daily as needed. 400 g 2   DULoxetine (CYMBALTA) 60 MG capsule Take 60 mg by mouth daily.     hydroxychloroquine (PLAQUENIL) 200 MG tablet TAKE 1 TABLET BY MOUTH TWICE DAILY ON MONDAY THROUGH FRIDAY ONLY 120 tablet 0   levothyroxine (SYNTHROID, LEVOTHROID) 50 MCG tablet Take 50 mcg by mouth daily.       LORazepam (ATIVAN) 2 MG tablet Take 2 mg by mouth at bedtime.      losartan (COZAAR) 100 MG tablet TAKE 1 TABLET(100 MG) BY MOUTH DAILY 90 tablet 3   Multiple Vitamins-Minerals (MULTIVITAMIN WITH MINERALS) tablet Take 1 tablet by mouth daily.       raloxifene (EVISTA) 60 MG tablet Take 60 mg by mouth daily. three times a week     sotalol (BETAPACE) 80 MG tablet TAKE 1 TABLET(80 MG) BY MOUTH TWICE DAILY 180 tablet 3   spironolactone (ALDACTONE) 25 MG tablet Take 1 tablet (25 mg total) by mouth daily. (Patient not taking: Reported on 04/07/2020) 90 tablet 3   No current facility-administered medications for this encounter.    Allergies  Allergen Reactions   Codeine Other (See Comments)    Made stomach spasm   Robinul [Glycopyrrolate] Other (See Comments)    AFIB   Sulfamethoxazole-Trimethoprim Other (See Comments)    Peripheral neuropathy in hands    Social History   Socioeconomic History   Marital status: Widowed    Spouse name: Not on file    Number of children: 2   Years of education: Not on file   Highest education level: Not on file  Occupational History   Occupation: retired LPN  Tobacco Use   Smoking status: Never Smoker   Smokeless tobacco: Never Used  Scientific laboratory technician Use: Never used  Substance and Sexual Activity   Alcohol use: Not Currently   Drug use: Never   Sexual activity: Not on file  Other Topics Concern   Not on file  Social History Narrative   1 cup of coffee daily   Social Determinants of Health   Financial Resource Strain:    Difficulty of Paying Living Expenses: Not  on file  Food Insecurity:    Worried About Charity fundraiser in the Last Year: Not on file   [employer] of Food in the Last Year: Not on file  Transportation Needs:    Lack of Transportation (Medical): Not on file   Lack of Transportation (Non-Medical): Not on file  Physical Activity:    Days of Exercise per Week: Not on file   Minutes of Exercise per Session: Not on file  Stress:    Feeling of Stress : Not on file  Social Connections:    Frequency of Communication with Friends and Family: Not on file   Frequency of Social Gatherings with Friends and Family: Not on file   Attends Religious Services: Not on file   Active Member of Clubs or Organizations: Not on file   Attends Archivist Meetings: Not on file   Marital Status: Not on file  Intimate Partner Violence:    Fear of Current or Ex-Partner: Not on file   Emotionally Abused: Not on file   Physically Abused: Not on file   Sexually Abused: Not on file     ROS- All systems are reviewed and negative except as per the HPI above.  Physical Exam: Vitals:   04/07/20 1434  BP: (!) 176/90  Pulse: (!) 56  Weight: 66.4 kg  Height: 5' 5.5" (1.664 m)    GEN- The patient is well appearing elderly female, alert and oriented x 3 today.   Head- normocephalic, atraumatic Eyes-  Sclera clear, conjunctiva pink Ears- hearing  intact Oropharynx- clear Neck- supple  Lungs- Clear to ausculation bilaterally, normal work of breathing Heart- Regular rate and rhythm, occasional ectopic beat, no murmurs, rubs or gallops  GI- soft, NT, ND, + BS Extremities- no clubbing, cyanosis, or edema MS- no significant deformity or atrophy Skin- no rash or lesion Psych- euthymic mood, full affect Neuro- strength and sensation are intact  Wt Readings from Last 3 Encounters:  04/07/20 66.4 kg  12/20/19 66.9 kg  12/13/19 68.3 kg    EKG today demonstrates SB HR 56, PACs, 1st degree AV block, PR 232, QRS 82, QTc 436  Echo 05/08/08 demonstrated  LVEF 65%, no significant valve issues.  Epic records are reviewed at length today  CHA2DS2-VASc Score = 5  The patient's score is based upon: CHF History: 0 HTN History: 1 Age : 2 Diabetes History: 0 Stroke History: 0 Vascular Disease History: 1 Gender: 1      ASSESSMENT AND PLAN: 1. Paroxysmal Atrial Fibrillation (ICD10:  I48.0) The patient's CHA2DS2-VASc score is 5, indicating a 7.2% annual risk of stroke.   Patient back in SR. She has done well with sotalol for many years. Will continue present therapy unless her afib becomes more frequent/persistent.  We discussed her stroke risk and the risks and benefits of anticoagulation. She refused anticoagulation today. She voices understanding if her stroke risk.  Will check echocardiogram.  Continue sotalol 80 mg BID  2. Secondary Hypercoagulable State (ICD10:  D68.69) The patient is at significant risk for stroke/thromboembolism based upon her CHA2DS2-VASc Score of 5.  Patient is not on anticoagulaiton. Patient refused.    3. HTN Elevated today. She brings in home BP readings with show good control. No change today.  4. CAD No anginal symptoms.   Follow up in the AF clinic in 4 months.    [facility] [address] [place], [place] [address] [phone] 04/07/2020 3:53  PM

## 2020-04-07 NOTE — Telephone Encounter (Signed)
Patient c/o Palpitations:  High priority if patient c/o lightheadedness, shortness of breath, or chest pain  1) How long have you had palpitations/irregular HR/ Afib? Are you having the symptoms now? Yes, irregular heart beatheart   2) Are you currently experiencing lightheadedness, SOB or CP? Not short of breath now, feels real shaky inside of her body  3) Do you have a history of afib (atrial fibrillation) or irregular heart rhythm?  4) Have you checked your BP or HR? (document readings if available): 166/98 and pulse was 58Are you experiencing any other symptoms? Real weak, some shortness of breath, wants to come in for an EKG today

## 2020-04-14 ENCOUNTER — Other Ambulatory Visit: Payer: Self-pay

## 2020-04-14 ENCOUNTER — Ambulatory Visit (HOSPITAL_COMMUNITY)
Admission: RE | Admit: 2020-04-14 | Discharge: 2020-04-14 | Disposition: A | Discharge status: Home / Self Care | Payer: PPO | Source: Ambulatory Visit | Attending: Physician Assistant | Admitting: Physician Assistant

## 2020-04-14 DIAGNOSIS — I252 Old myocardial infarction: Secondary | ICD-10-CM | POA: Diagnosis not present

## 2020-04-14 DIAGNOSIS — I119 Hypertensive heart disease without heart failure: Secondary | ICD-10-CM | POA: Insufficient documentation

## 2020-04-14 DIAGNOSIS — I517 Cardiomegaly: Secondary | ICD-10-CM | POA: Diagnosis not present

## 2020-04-14 DIAGNOSIS — I351 Nonrheumatic aortic (valve) insufficiency: Secondary | ICD-10-CM | POA: Diagnosis not present

## 2020-04-14 DIAGNOSIS — I480 Paroxysmal atrial fibrillation: Secondary | ICD-10-CM | POA: Insufficient documentation

## 2020-04-14 NOTE — Progress Notes (Signed)
Echocardiogram 2D Echocardiogram has been performed.  Fidel Levy 04/14/2020, 3:05 PM

## 2020-04-17 LAB — ECHOCARDIOGRAM COMPLETE
Area-P 1/2: 3.91 cm2
S' Lateral: 2.3 cm

## 2020-04-18 ENCOUNTER — Ambulatory Visit: Payer: PPO | Admitting: Podiatry

## 2020-04-30 DIAGNOSIS — Z6824 Body mass index (BMI) 24.0-24.9, adult: Secondary | ICD-10-CM | POA: Diagnosis not present

## 2020-04-30 DIAGNOSIS — Z01419 Encounter for gynecological examination (general) (routine) without abnormal findings: Secondary | ICD-10-CM | POA: Diagnosis not present

## 2020-05-02 NOTE — Progress Notes (Signed)
Office Visit Note  Patient: Alice Medina             Date of Birth: 06-Sep-1940           MRN: [id number]             PCP: Shon Baton, MD Referring: Shon Baton, MD Visit Date: 05/15/2020 Occupation: @GUAROCC @  Subjective:  Sicca symptoms   History of Present Illness: Alice Medina is a 79 y.o. female with history of autoimmune disease, Sjogren's syndrome, osteoarthritis, and osteoporosis.  She is taking Plaquenil 200 mg 1 tablet by mouth twice daily Monday through Friday.  She is tolerating Plaquenil without any side effects.  She continues to have chronic sicca symptoms.  She has been using Restasis eyedrops as well as a lubricating gel at bedtime for symptomatic relief.  She continues to have intermittent symptoms of Raynaud's but denies any digital ulcerations.  She denies any recent rashes but has ongoing photosensitivity.  She experiences intermittent lower back pain especially with overuse activities such as working in her garden.  She denies any symptoms of radiculopathy.  She states that both knee joints are doing well and denies any joint swelling recently.  She denies any oral or nasal ulcerations.  She states she was experiencing episodic PACs and had an Echo and EKG recently, which were unremarkable.  She is scheduled for her annual physical with Dr. Virgina Jock next week.  She is planning on having updated lab work tomorrow. She continues to take Evista prescribed by her gynecologist for management of osteoporosis.  She is taking a calcium and vitamin D supplement.  She denies any recent falls or fractures. She is due to update her next DEXA in May 2022.      Activities of Daily Living:  Patient reports morning stiffness for 15-30 minutes.   Patient Denies nocturnal pain.  Difficulty dressing/grooming: Denies Difficulty climbing stairs: Reports Difficulty getting out of chair: Reports Difficulty using hands for taps, buttons, cutlery, and/or writing: Reports  Review of  Systems  Constitutional: Positive for fatigue.  HENT: Positive for mouth dryness and nose dryness. Negative for mouth sores.   Eyes: Positive for redness and dryness. Negative for pain, itching and visual disturbance.  Respiratory: Positive for shortness of breath. Negative for cough, hemoptysis and difficulty breathing.   Cardiovascular: Negative for chest pain, palpitations, hypertension and swelling in legs/feet.  Gastrointestinal: Positive for blood in stool, constipation and diarrhea.       Scheduled for a colonoscopy on 06/25/2020  Endocrine: Negative for increased urination.  Genitourinary: Negative for difficulty urinating and painful urination.  Musculoskeletal: Positive for arthralgias, joint pain, joint swelling, myalgias, morning stiffness, muscle tenderness and myalgias. Negative for muscle weakness.  Skin: Positive for color change and rash. Negative for pallor, hair loss, nodules/bumps, skin tightness, ulcers and sensitivity to sunlight.  Allergic/Immunologic: Negative for susceptible to infections.  Neurological: Negative for dizziness, numbness and headaches.  Hematological: Positive for bruising/bleeding tendency. Negative for swollen glands.  Psychiatric/Behavioral: Negative for depressed mood and sleep disturbance. The patient is not nervous/anxious.     PMFS History:  Patient Active Problem List   Diagnosis Date Noted  . Paroxysmal atrial fibrillation ([place]) 04/07/2020  . Secondary hypercoagulable state ([place]) 04/07/2020  . Multiple closed fractures of metacarpal bone 07/25/2017  . Autoimmune disease ([place]) +ANA +Ro +La +RF oral ulcers nasal ulcers Raynaud photosensitivity SICCA  01/13/2017  . High risk medication use 01/13/2017  . Photosensitivity 01/13/2017  . Sjogren's syndrome with keratoconjunctivitis sicca ([place])  01/13/2017  . Recurrent oral ulcers and nasal ulcers  01/13/2017  . Raynaud's disease without gangrene 01/13/2017  . Primary osteoarthritis of both hands  01/13/2017  . Primary osteoarthritis of both knees 01/13/2017  . Primary osteoarthritis of both feet 01/13/2017  . Osteopenia of multiple sites 01/13/2017  . History of hypertension 01/13/2017  . History of hypothyroidism 01/13/2017  . History of gastroesophageal reflux (GERD)/ PUD 01/13/2017  . History of MRSA infection 01/13/2017  . Diarrhea 05/28/2013  . Family history of malignant neoplasm of gastrointestinal tract 06/22/2011  . ADENOCARCINOMA, BREAST 05/02/2009  . CORONARY ARTERY DISEASE 05/02/2009  . DYSPHAGIA UNSPECIFIED 05/02/2009    Past Medical History:  Diagnosis Date  . ADENOCARCINOMA, BREAST 05/02/2009   Qualifier: Diagnosis of  By: Deatra Ina MD, Sandy Salaam   . Anxiety   . Arthritis   . Atrial fibrillation ([place])   . Autoimmune disorder ([place])    SJORGREN'S  . Breast cancer ([place])    right breast  . Cardiac arrhythmia due to congenital heart disease   . Chronic headaches   . CORONARY ARTERY DISEASE 05/02/2009   Qualifier: Diagnosis of  By: Deatra Ina MD, Sandy Salaam   . Depression   . Diarrhea 05/28/2013  . DYSPHAGIA UNSPECIFIED 05/02/2009   Qualifier: Diagnosis of  By: Deatra Ina MD, Sandy Salaam   . Family history of malignant neoplasm of gastrointestinal tract 06/22/2011   Brother with colon cancer   . GERD (gastroesophageal reflux disease)   . Hemorrhage of rectum and anus 06/22/2011  . History of hypothyroidism 01/13/2017  . History of MRSA infection 01/13/2017  . Hypertension   . Multiple closed fractures of metacarpal bone 07/25/2017  . Myocardial infarction ([place])    1990  . Osteopenia of multiple sites 01/13/2017  . Osteoporosis   . PAC (premature atrial contraction) 04/07/2020  . Photosensitivity 01/13/2017  . Primary osteoarthritis of both hands 01/13/2017  . Raynaud's disease without gangrene 01/13/2017  . Recurrent oral ulcers and nasal ulcers  01/13/2017  . Sjogren's syndrome with keratoconjunctivitis sicca ([place]) 01/13/2017  . Status post dilation of esophageal narrowing     . Thyroid disease   . Ulcer     Family History  Problem Relation Age of Onset  . Breast cancer Mother   . Lung cancer Mother   . Cancer Father        larynx  . Heart failure Father   . Heart disease Brother   . Colon cancer Brother   . Pseudochol deficiency Other   . Breast cancer Sister   . Cancer Sister        thyroid   . Breast cancer Maternal Grandmother   . Autoimmune disease Daughter   . Cancer Son        prostate   . Diabetes Son   . Rheum arthritis Grandchild   . Esophageal cancer Neg Hx   . Rectal cancer Neg Hx    Past Surgical History:  Procedure Laterality Date  . APPENDECTOMY    . breast reconstuction     x 2  . CHOLECYSTECTOMY    . COLONOSCOPY    . double mastectomy    . PARTIAL HYSTERECTOMY  1990  . TIBIA FRACTURE SURGERY Right    with subsequent hardware removal  . UPPER GASTROINTESTINAL ENDOSCOPY    . WRIST FRACTURE SURGERY Left    Social History   Social History Narrative   1 cup of coffee daily   Immunization History  Administered Date(s) Administered  . PFIZER SARS-COV-2  Vaccination 09/03/2019, 09/28/2019  . Zoster Recombinat (Shingrix) 01/10/2018, 05/11/2018     Objective: Vital Signs: BP 136/86 (BP Location: Left Arm, Patient Position: Sitting, Cuff Size: Small)   Pulse 61   Resp 12   Ht 5\' 5"  (1.651 m)   Wt 146 lb 6.4 oz (66.4 kg)   BMI 24.36 kg/m    Physical Exam Vitals and nursing note reviewed.  Constitutional:      Appearance: She is well-developed.  HENT:     Head: Normocephalic and atraumatic.  Eyes:     Conjunctiva/sclera: Conjunctivae normal.  Pulmonary:     Effort: Pulmonary effort is normal.  Abdominal:     Palpations: Abdomen is soft.  Musculoskeletal:     Cervical back: Normal range of motion.  Skin:    General: Skin is warm and dry.     Capillary Refill: Capillary refill takes less than 2 seconds.  Neurological:     Mental Status: She is alert and oriented to person, place, and time.  Psychiatric:         Behavior: Behavior normal.      Musculoskeletal Exam: C-spine limited ROM.  Thoracic kyphosis noted. Painful ROM of lumbar spine.  Midline spinal tenderness in lumbar region. Shoulder joints, elbow joints, wrist joints, MCPs, PIPs, and DIPs good ROM with no synovitis.  Complete fist formation bilaterally.  PIP and DIP thickening consistent with osteoarthritis of both hands.  [place] joint prominence bilaterally. Hip joints, knee joints, and ankle joints good ROM with no discomfort.  No warmth or effusion of knee joints.  No tenderness or inflammation of both ankle joints.  Tenderness over bilateral trochanteric bursa.   CDAI Exam: CDAI Score: -- Patient Global: --; Provider Global: -- Swollen: --; Tender: -- Joint Exam 05/15/2020   No joint exam has been documented for this visit   There is currently no information documented on the homunculus. Go to the Rheumatology activity and complete the homunculus joint exam.  Investigation: No additional findings.  Imaging: No results found.  Recent Labs: Lab Results  Component Value Date   WBC 3.4 (L) 01/16/2020   HGB 13.2 01/16/2020   PLT 194 01/16/2020   NA 134 (L) 12/13/2019   K 4.5 12/13/2019   CL 102 12/13/2019   CO2 25 12/13/2019   GLUCOSE 98 12/13/2019   BUN 21 12/13/2019   CREATININE 0.84 12/13/2019   BILITOT 0.4 12/13/2019   ALKPHOS 79 12/13/2017   AST 28 12/13/2019   ALT 15 12/13/2019   PROT 7.0 12/13/2019   PROT 7.2 12/13/2019   ALBUMIN 3.6 12/13/2017   CALCIUM 9.9 12/13/2019   GFRAA 77 12/13/2019    Speciality Comments: PLQ Eye Exam: 06/29/2019 WNL @ [facility].  Procedures:  No procedures performed Allergies: Codeine, Robinul [glycopyrrolate], and Sulfamethoxazole-trimethoprim   Assessment / Plan:     Visit Diagnoses: Autoimmune disease ([place]) +ANA +Ro +La +RF oral ulcers nasal ulcers Raynaud photosensitivity SICCA  -She has not had any signs or symptoms of a flare recently. She is clinically  doing well on Plaquenil 200 mg 1 tablet by mouth twice daily M-F.  She has not missed any doses and is tolerating PLQ without any side effects.  She has no synovitis on exam.  She experiencing intermittent joint stiffness and arthralgias but has no inflammation on exam.  She has not had any recent rashes. No malar rash noted on exam.  She was advised to avoid direct sun exposure and to wear sunscreen SPF greater than  50 on a daily basis.  She has been experiencing more frequent symptoms of Raynaud's with the cooler weather temperatures.  We discussed importance of wearing gloves, thick socks, and keeping her core body temperature warm.  No digital ulcerations or signs of gangrene were noted on examination today.  She continues to have chronic sicca symptoms and uses over-the-counter products for symptomatic relief.  She has been using Restasis eyedrops on a daily basis as well as a gel lubricating drop which has been helpful for her eye dryness.  No cervical lymphadenopathy was palpable.  No tenderness or inflammation of the parotid glands were noted on exam.  She has not had any recent oral or nasal ulcerations.  She has had several episodes of PACs and had a routine echocardiogram and EKG which were unremarkable according to the patient.  She is not experiencing any increase shortness of breath at this time.  She has her upcoming annual physical with Dr. Virgina Jock next week.  We will fax lab orders to his office to update her autoimmune lab work with her other routine labs being drawn tomorrow.  She will continue taking Plaquenil as prescribed.  A refill was sent to the pharmacy today.  She was advised to notify us if she develops any new or worsening symptoms.  She will follow-up in the office in 5 months.  Plan: hydroxychloroquine (PLAQUENIL) 200 MG tablet  Sjogren's syndrome with keratoconjunctivitis sicca ([place]): She continues to have chronic sicca symptoms.  She has been using Restasis and gel lubricating drops  for symptomatic relief.  She is also been using lotions to help with mouth dryness.  She has no parotid swelling or tenderness on examination today.  No cervical lymphadenopathy was palpable.  She will continue taking Plaquenil as prescribed.  High risk medication use - PLQ 200 mg 1 tablet by mouth BID Monday through Friday.PLQ Eye Exam: 06/29/2019.  She has an upcoming Plaquenil eye exam scheduled in December 2021.  She was given a Plaquenil eye exam form to take with her to her upcoming appointment.- Plan: hydroxychloroquine (PLAQUENIL) 200 MG tablet  Raynaud's disease without gangrene: She has been experiencing more frequent symptoms of Raynaud's recently.  No digital ulcerations or signs of gangrene were noted on exam.  No skin thickening or tightness was noted.  We discussed the importance of avoiding triggers.  She was advised to wear gloves, thick socks And keep her core body temperature warm.  She was advised to notify us if she develops any new or worsening symptoms.  Primary osteoarthritis of both hands: She has PIP and DIP thickening consistent with osteoarthritis of both hands.  CMC joint prominence noted bilaterally.  She has no joint tenderness or inflammation on examination today.  She is able to make a complete fist bilaterally.  Joint protection and muscle strengthening were discussed.  Primary osteoarthritis of both knees: She has good range of motion of both knee joints.  No warmth or effusion was noted.  Primary osteoarthritis of both feet: She is not experiencing any discomfort in her feet at this time.  She has chronic pain in the right ankle which she previously fractured.  Age-related osteoporosis without current pathological fracture -  12/14/18 DEXA BMD 0.523 with T-score -2.9 1/3 right forearm.  She is prescribed Evista 60 mg 3 times weekly prescribed by her gynecologist.  She continues to take a calcium and vitamin D supplement on a daily basis.  She has not had any recent falls  or fractures.  Other medical conditions are listed as follows:  History of hypothyroidism  History of hypertension  History of gastroesophageal reflux (GERD)/ PUD  History of MRSA infection   Orders: No orders of the defined types were placed in this encounter.  Meds ordered this encounter  Medications  . hydroxychloroquine (PLAQUENIL) 200 MG tablet    Sig: TAKE 1 TABLET BY MOUTH TWICE DAILY ON MONDAY THROUGH FRIDAY ONLY    Dispense:  120 tablet    Refill:  0     Follow-Up Instructions: Return in about 5 months (around 10/13/2020) for Autoimmune Disease, Osteoarthritis, Osteoporosis, Sjogren's syndrome.   Ofilia Neas, PA-C  Note - This record has been created using Dragon software.  Chart creation errors have been sought, but may not always  have been located. Such creation errors do not reflect on  the standard of medical care.

## 2020-05-07 ENCOUNTER — Encounter: Payer: Self-pay | Admitting: Gastroenterology

## 2020-05-07 ENCOUNTER — Ambulatory Visit: Payer: PPO | Admitting: Gastroenterology

## 2020-05-07 VITALS — BP 106/64 | HR 66 | Ht 64.0 in | Wt 145.0 lb

## 2020-05-07 DIAGNOSIS — R1319 Other dysphagia: Secondary | ICD-10-CM | POA: Diagnosis not present

## 2020-05-07 DIAGNOSIS — R194 Change in bowel habit: Secondary | ICD-10-CM | POA: Diagnosis not present

## 2020-05-07 DIAGNOSIS — K5909 Other constipation: Secondary | ICD-10-CM | POA: Diagnosis not present

## 2020-05-07 MED ORDER — PLENVU 140 G PO SOLR: 140.0000 g | ORAL | 0 refills | Status: DC

## 2020-05-07 NOTE — Patient Instructions (Signed)
If you are age 80 or older, your body mass index should be between 23-30. Your Body mass index is 24.89 kg/m. If this is out of the aforementioned range listed, please consider follow up with your Primary Care Provider.  If you are age 61 or younger, your body mass index should be between 19-25. Your Body mass index is 24.89 kg/m. If this is out of the aformentioned range listed, please consider follow up with your Primary Care Provider.   You have been scheduled for an endoscopy and colonoscopy. Please follow the written instructions given to you at your visit today. Please pick up your prep supplies at the pharmacy within the next 1-3 days. If you use inhalers (even only as needed), please bring them with you on the day of your procedure.  It was a pleasure to see you today!  Dr. Loletha Carrow

## 2020-05-07 NOTE — Progress Notes (Signed)
[facility] Consult Note:  History: Alice Medina 05/07/2020  Referring provider: Shon Baton, MD  Reason for consult/chief complaint: Constipation (constipation for about 3 days and then is Alice Medina has a some diarrhea or loose stools )   Subjective  HPI: This is a very pleasant 79 year old woman referred to see [name] for change in bowel habits and dysphagia.  She has tended toward constipation for many years, but over the last 6 to 9 months seems to have more difficulty with less frequent bowel movements and the need to strain.  She has not typically wanted to take any laxatives because she thought it might make her dependent on them.  There is no rectal bleeding or abdominal pain loss of appetite or weight loss. She is also had several episodes of food being completely lodged in the esophagus over this last year, and this is reminiscent of her previous esophageal stricture.  [name] saw Dr. Deatra Ina in 2014 and 2015 for similar symptoms.  There was reportedly some improvement with cholestyramine. Last colonoscopy December 2012, no polyps.  Recommendation was repeat exam in 5 years due to family history of colon cancer in her sister (though and clarifies it to have been colon cancer in her brother). Sigmoidoscopy November 2014 for diarrhea, normal study, biopsies negative for microscopic colitis. EGD for dysphagia November 2012, esophageal stricture dilated with 18 mm bougie, small gastric ulcers were seen, biopsies negative for H. Pylori.  ROS:  Review of Systems  Constitutional: Negative for appetite change and unexpected weight change.  HENT: Negative for mouth sores and voice change.   Eyes: Negative for pain and redness.  Respiratory: Positive for shortness of breath. Negative for cough.   Cardiovascular: Negative for chest pain and palpitations.  Genitourinary: Negative for dysuria and hematuria.       Urinary incontinence  Musculoskeletal: Positive for  arthralgias. Negative for myalgias.  Skin: Negative for pallor and rash.  Neurological: Negative for weakness and headaches.  Hematological: Negative for adenopathy.  Psychiatric/Behavioral: Positive for dysphoric mood.       She reports lately going through some "family stress".      Past Medical History: Past Medical History:  Diagnosis Date  . ADENOCARCINOMA, BREAST 05/02/2009   Qualifier: Diagnosis of  By: Deatra Ina MD, Sandy Salaam   . Anxiety   . Arthritis   . Atrial fibrillation ([place])   . Autoimmune disorder ([place])    SJORGREN'S  . Breast cancer ([place])    right breast  . Cardiac arrhythmia due to congenital heart disease   . Chronic headaches   . CORONARY ARTERY DISEASE 05/02/2009   Qualifier: Diagnosis of  By: Deatra Ina MD, Sandy Salaam   . Depression   . Diarrhea 05/28/2013  . DYSPHAGIA UNSPECIFIED 05/02/2009   Qualifier: Diagnosis of  By: Deatra Ina MD, Sandy Salaam   . Family history of malignant neoplasm of gastrointestinal tract 06/22/2011   Brother with colon cancer   . GERD (gastroesophageal reflux disease)   . Hemorrhage of rectum and anus 06/22/2011  . History of hypothyroidism 01/13/2017  . History of MRSA infection 01/13/2017  . Hypertension   . Multiple closed fractures of metacarpal bone 07/25/2017  . Myocardial infarction ([place])    1990  . Osteopenia of multiple sites 01/13/2017  . Osteoporosis   . Photosensitivity 01/13/2017  . Primary osteoarthritis of both hands 01/13/2017  . Raynaud's disease without gangrene 01/13/2017  . Recurrent oral ulcers and nasal ulcers  01/13/2017  . Sjogren's syndrome with keratoconjunctivitis sicca ([place])  01/13/2017  . Status post dilation of esophageal narrowing   . Thyroid disease   . Ulcer      Past Surgical History: Past Surgical History:  Procedure Laterality Date  . APPENDECTOMY    . breast reconstuction     x 2  . CHOLECYSTECTOMY    . COLONOSCOPY    . double mastectomy    . PARTIAL HYSTERECTOMY  1990  . TIBIA FRACTURE SURGERY Right     with subsequent hardware removal  . UPPER GASTROINTESTINAL ENDOSCOPY    . WRIST FRACTURE SURGERY Left      Family History: Family History  Problem Relation Age of Onset  . Breast cancer Mother   . Lung cancer Mother   . Cancer Father        larynx  . Heart failure Father   . Heart disease Brother   . Colon cancer Brother   . Pseudochol deficiency Other   . Breast cancer Sister   . Cancer Sister        thyroid   . Breast cancer Maternal Grandmother   . Autoimmune disease Daughter   . Cancer Son        prostate   . Diabetes Son   . Rheum arthritis Grandchild   . Esophageal cancer Neg Hx   . Rectal cancer Neg Hx     Social History: Social History   Socioeconomic History  . Marital status: Widowed    Spouse name: Not on file  . Number of children: 2  . Years of education: Not on file  . Highest education level: Not on file  Occupational History  . Occupation: retired Corporate treasurer  Tobacco Use  . Smoking status: Never Smoker  . Smokeless tobacco: Never Used  Vaping Use  . Vaping Use: Never used  Substance and Sexual Activity  . Alcohol use: Not Currently  . Drug use: Never  . Sexual activity: Not on file  Other Topics Concern  . Not on file  Social History Narrative   1 cup of coffee daily   Social Determinants of Health   Financial Resource Strain:   . Difficulty of Paying Living Expenses: Not on file  Food Insecurity:   . Worried About Charity fundraiser in the Last Year: Not on file  . Ran Out of Food in the Last Year: Not on file  Transportation Needs:   . Lack of Transportation (Medical): Not on file  . Lack of Transportation (Non-Medical): Not on file  Physical Activity:   . Days of Exercise per Week: Not on file  . Minutes of Exercise per Session: Not on file  Stress:   . Feeling of Stress : Not on file  Social Connections:   . Frequency of Communication with Friends and Family: Not on file  . Frequency of Social Gatherings with Friends and  Family: Not on file  . Attends Religious Services: Not on file  . Active Member of Clubs or Organizations: Not on file  . Attends Archivist Meetings: Not on file  . Marital Status: Not on file   Widowed many years ago, daughter and son-in-law live next door  Allergies: Allergies  Allergen Reactions  . Codeine Other (See Comments)    Made stomach spasm  . Robinul [Glycopyrrolate] Other (See Comments)    AFIB  . Sulfamethoxazole-Trimethoprim Other (See Comments)    Peripheral neuropathy in hands    Outpatient Meds: Current Outpatient Medications  Medication Sig Dispense Refill  . amLODipine (NORVASC)  2.5 MG tablet TAKE 1 TABLET BY MOUTH EVERY DAY 90 tablet 3  . aspirin 325 MG tablet Take 325 mg by mouth daily.    . Budesonide-Formoterol Fumarate (SYMBICORT IN) Inhale into the lungs as needed.     . cholecalciferol (VITAMIN D) 1000 UNITS tablet Take 1,000 Units by mouth daily.     . cyclobenzaprine (FLEXERIL) 10 MG tablet Take 10 mg by mouth at bedtime as needed for muscle spasms.     . cycloSPORINE (RESTASIS) 0.05 % ophthalmic emulsion Place 1 drop into both eyes 2 (two) times daily.     . diclofenac Sodium (VOLTAREN) 1 % GEL Apply 2-4 grams to affected joint 4 times daily as needed. 400 g 2  . DULoxetine (CYMBALTA) 60 MG capsule Take 60 mg by mouth daily.    . hydroxychloroquine (PLAQUENIL) 200 MG tablet TAKE 1 TABLET BY MOUTH TWICE DAILY ON MONDAY THROUGH FRIDAY ONLY 120 tablet 0  . levothyroxine (SYNTHROID, LEVOTHROID) 50 MCG tablet Take 50 mcg by mouth daily.      Marland Kitchen LORazepam (ATIVAN) 2 MG tablet Take 2 mg by mouth at bedtime.     Marland Kitchen losartan (COZAAR) 100 MG tablet TAKE 1 TABLET(100 MG) BY MOUTH DAILY 90 tablet 3  . Multiple Vitamins-Minerals (MULTIVITAMIN WITH MINERALS) tablet Take 1 tablet by mouth daily.      . raloxifene (EVISTA) 60 MG tablet Take 60 mg by mouth daily. three times a week    . sotalol (BETAPACE) 80 MG tablet TAKE 1 TABLET(80 MG) BY MOUTH TWICE DAILY  180 tablet 3  . spironolactone (ALDACTONE) 25 MG tablet Take 1 tablet (25 mg total) by mouth daily. 90 tablet 3  . PEG-KCl-NaCl-NaSulf-Na Asc-C (PLENVU) 140 g SOLR Take 140 g by mouth as directed. 1 each 0   No current facility-administered medications for this visit.      ___________________________________________________________________ Objective   Exam:  BP 106/64   Pulse 66   Ht 5\' 4"  (1.626 m)   Wt 145 lb (65.8 kg)   BMI 24.89 kg/m    General: Well-appearing  Eyes: sclera anicteric, no redness  ENT: oral mucosa moist without lesions, no cervical or supraclavicular lymphadenopathy  CV: RRR without murmur, S1/S2, no JVD, no peripheral edema  Resp: clear to auscultation bilaterally, normal RR and effort noted  GI: soft, no tenderness, with active bowel sounds. No guarding or palpable organomegaly noted.  Skin; warm and dry, no rash or jaundice noted  Neuro: awake, alert and oriented x 3. Normal gross motor function and fluent speech Osteoarthritic changes in the hands Labs:  No recent data.  Previous endoscopic reports as noted above  Assessment: Encounter Diagnoses  Name Primary?  . Chronic constipation Yes  . Change in bowel habits   . Esophageal dysphagia     Chronic constipation with episodic loose stool, which has been her pattern for many years.  However, she has noticed more difficulty with the constipation over the last 6 to 9 months.  She is long overdue for a screening colonoscopy with family history and a brother.  I wonder about possible side effect of Cymbalta.   Plan: Low dose miralax every day to every other day Upper endoscopy with probable dilation for dysphagia and history of stricture Colonoscopy for lower GI symptoms  She was agreeable after discussion of procedures and risks.  The benefits and risks of the planned procedure were described in detail with the patient or (when appropriate) their health care proxy.  Risks were outlined  as including, but not limited to, bleeding, infection, perforation, adverse medication reaction leading to cardiac or pulmonary decompensation, pancreatitis (if ERCP).  The limitation of incomplete mucosal visualization was also discussed.  No guarantees or warranties were given.     Thank you for the courtesy of this consult.  Please call me with any questions or concerns.  Nelida Meuse III  CC: Referring provider noted above

## 2020-05-10 DIAGNOSIS — Z23 Encounter for immunization: Secondary | ICD-10-CM | POA: Diagnosis not present

## 2020-05-14 IMAGING — CR DG CHEST 2V
2 series · 2 of 2 positions shown · non-contrast
Comparison: 03/28/2016

CLINICAL DATA: Cough, fever, shortness of breath

EXAM:
CHEST - 2 VIEW

[w chest pa]
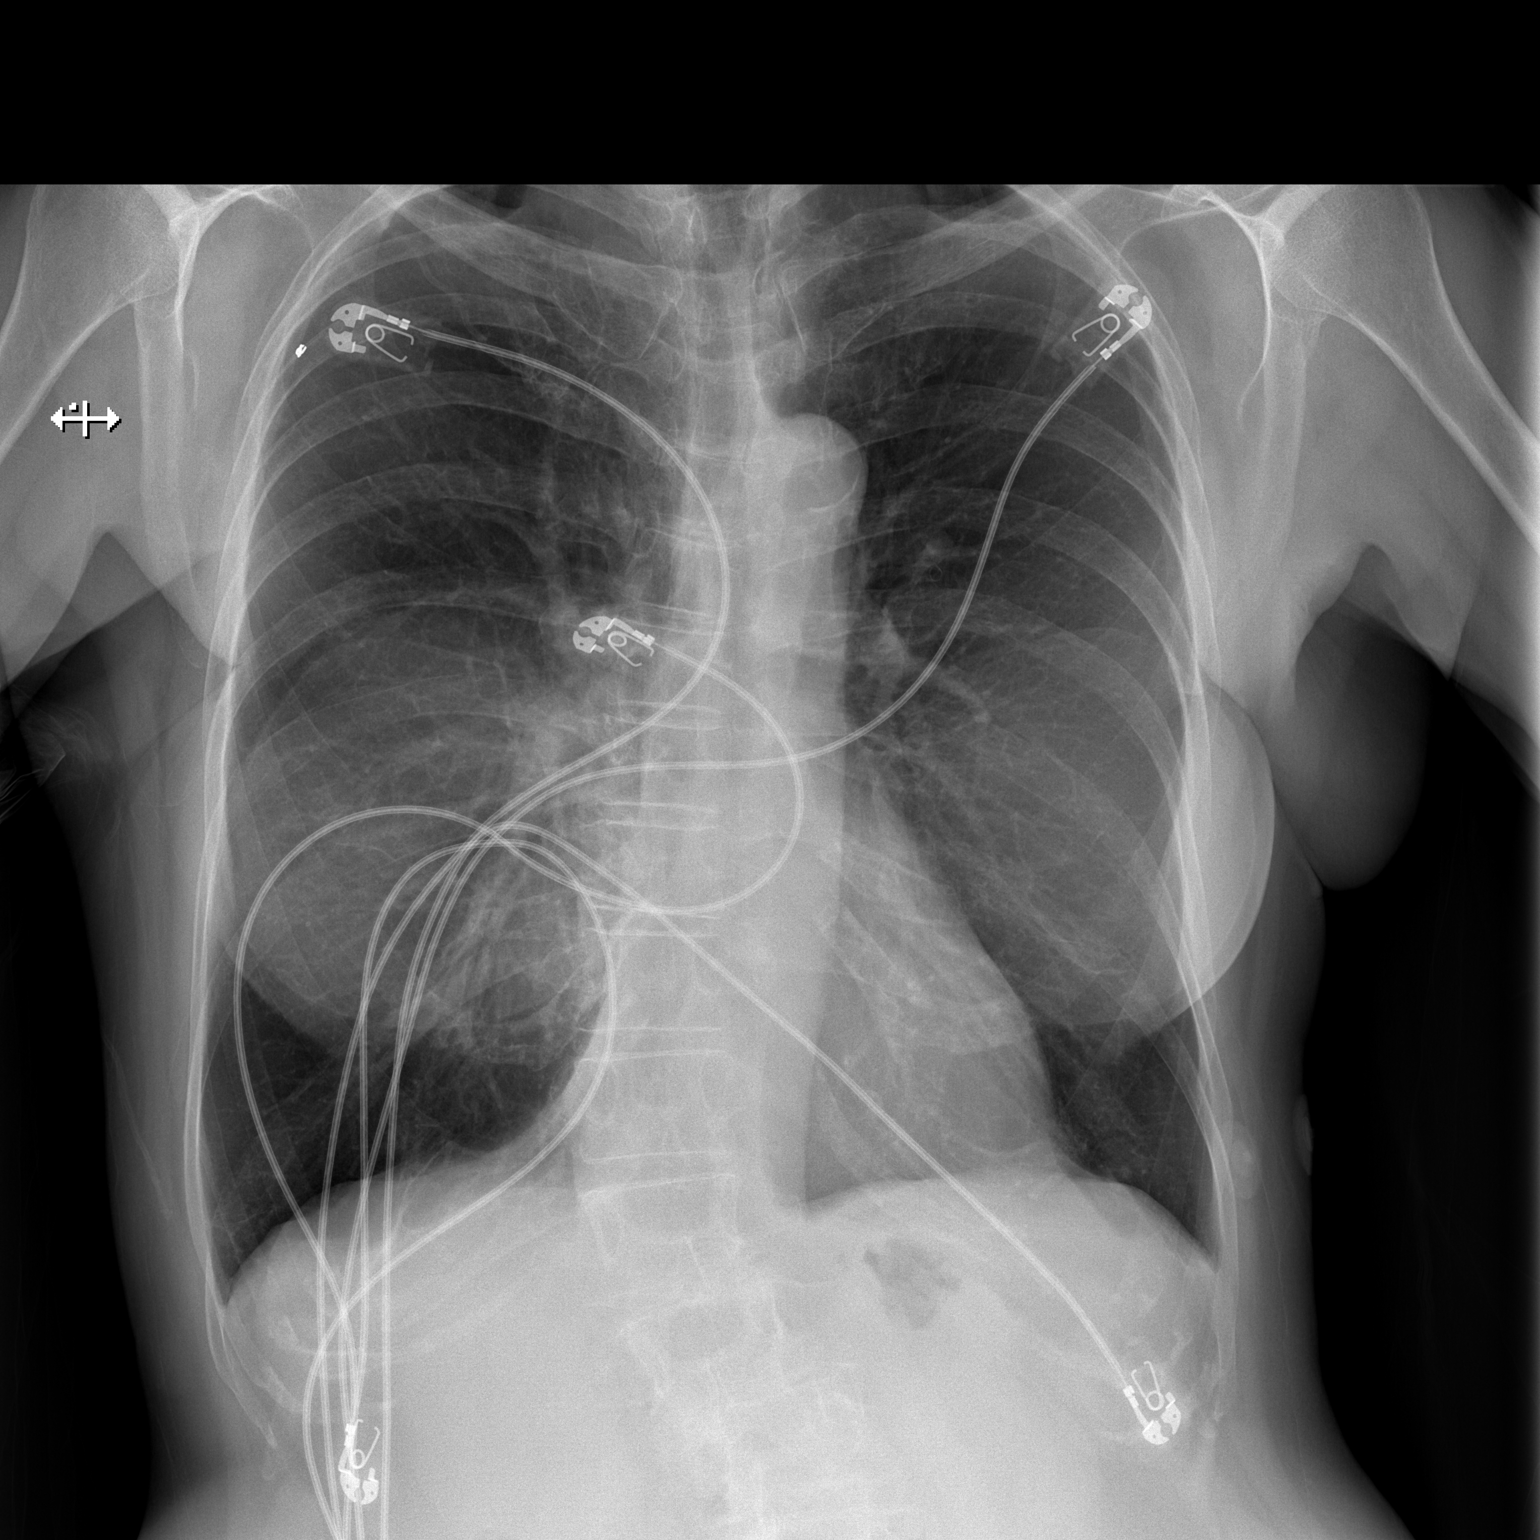

[w chest lat]
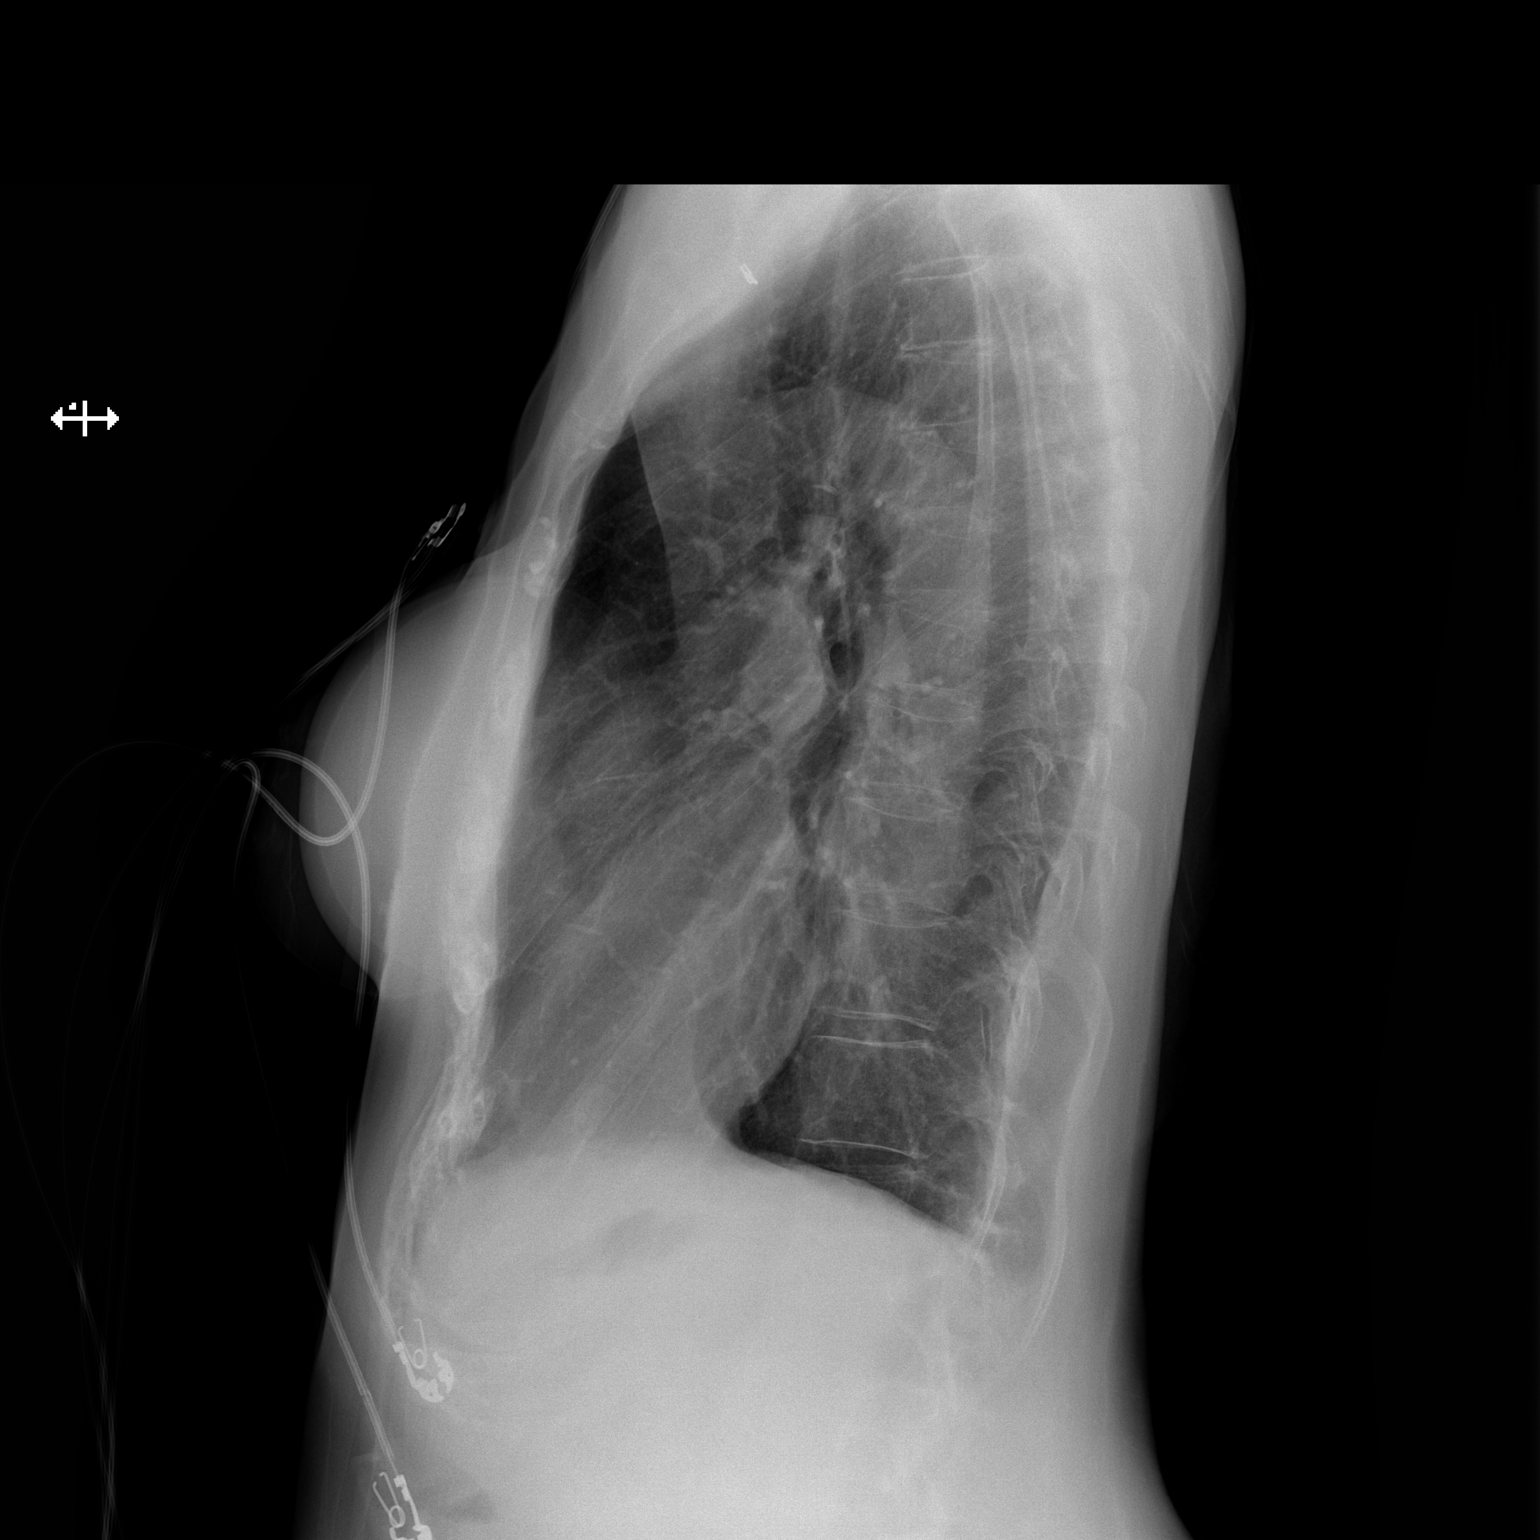

[2 of 2 positions shown; findings below may reference images not displayed]

FINDINGS: There is hyperinflation of the lungs compatible with COPD. Heart and
mediastinal contours are within normal limits. No focal opacities or
effusions. No acute bony abnormality.
IMPRESSION: COPD.  No active disease.

## 2020-05-15 ENCOUNTER — Ambulatory Visit: Payer: PPO | Admitting: Physician Assistant

## 2020-05-15 ENCOUNTER — Encounter: Payer: Self-pay | Admitting: Physician Assistant

## 2020-05-15 ENCOUNTER — Telehealth: Payer: Self-pay

## 2020-05-15 ENCOUNTER — Other Ambulatory Visit: Payer: Self-pay

## 2020-05-15 VITALS — BP 136/86 | HR 61 | Resp 12 | Ht 65.0 in | Wt 146.4 lb

## 2020-05-15 DIAGNOSIS — M3501 Sicca syndrome with keratoconjunctivitis: Secondary | ICD-10-CM

## 2020-05-15 DIAGNOSIS — M19071 Primary osteoarthritis, right ankle and foot: Secondary | ICD-10-CM

## 2020-05-15 DIAGNOSIS — Z8639 Personal history of other endocrine, nutritional and metabolic disease: Secondary | ICD-10-CM | POA: Diagnosis not present

## 2020-05-15 DIAGNOSIS — M170 Bilateral primary osteoarthritis of knee: Secondary | ICD-10-CM

## 2020-05-15 DIAGNOSIS — Z8719 Personal history of other diseases of the digestive system: Secondary | ICD-10-CM | POA: Diagnosis not present

## 2020-05-15 DIAGNOSIS — Z8679 Personal history of other diseases of the circulatory system: Secondary | ICD-10-CM | POA: Diagnosis not present

## 2020-05-15 DIAGNOSIS — M19072 Primary osteoarthritis, left ankle and foot: Secondary | ICD-10-CM

## 2020-05-15 DIAGNOSIS — M19041 Primary osteoarthritis, right hand: Secondary | ICD-10-CM | POA: Diagnosis not present

## 2020-05-15 DIAGNOSIS — M810 Age-related osteoporosis without current pathological fracture: Secondary | ICD-10-CM

## 2020-05-15 DIAGNOSIS — I7300 Raynaud's syndrome without gangrene: Secondary | ICD-10-CM

## 2020-05-15 DIAGNOSIS — Z79899 Other long term (current) drug therapy: Secondary | ICD-10-CM

## 2020-05-15 DIAGNOSIS — M359 Systemic involvement of connective tissue, unspecified: Secondary | ICD-10-CM | POA: Diagnosis not present

## 2020-05-15 DIAGNOSIS — M19042 Primary osteoarthritis, left hand: Secondary | ICD-10-CM

## 2020-05-15 DIAGNOSIS — Z8614 Personal history of Methicillin resistant Staphylococcus aureus infection: Secondary | ICD-10-CM | POA: Diagnosis not present

## 2020-05-15 MED ORDER — HYDROXYCHLOROQUINE SULFATE 200 MG PO TABS: ORAL_TABLET | ORAL | 0 refills | Status: DC

## 2020-05-15 NOTE — Telephone Encounter (Signed)
Lab orders faxed to Dr. Virgina Jock.

## 2020-05-15 NOTE — Patient Instructions (Signed)
Hip Bursitis Rehab Ask your health care provider which exercises are safe for you. Do exercises exactly as told by your health care provider and adjust them as directed. It is normal to feel mild stretching, pulling, tightness, or discomfort as you do these exercises. Stop right away if you feel sudden pain or your pain gets worse. Do not begin these exercises until told by your health care provider. Stretching exercise This exercise warms up your muscles and joints and improves the movement and flexibility of your hip. This exercise also helps to relieve pain and stiffness. Iliotibial band stretch An iliotibial band is a strong band of muscle tissue that runs from the outer side of your hip to the outer side of your thigh and knee. 1. Lie on your side with your left / right leg in the top position. 2. Bend your left / right knee and grab your ankle. Stretch out your bottom arm to help you balance. 3. Slowly bring your knee back so your thigh is behind your body. 4. Slowly lower your knee toward the floor until you feel a gentle stretch on the outside of your left / right thigh. If you do not feel a stretch and your knee will not fall farther, place the heel of your other foot on top of your knee and pull your knee down toward the floor with your foot. 5. Hold this position for __________ seconds. 6. Slowly return to the starting position. Repeat __________ times. Complete this exercise __________ times a day. Strengthening exercises These exercises build strength and endurance in your hip and pelvis. Endurance is the ability to use your muscles for a long time, even after they get tired. Bridge This exercise strengthens the muscles that move your thigh backward (hip extensors). 1. Lie on your back on a firm surface with your knees bent and your feet flat on the floor. 2. Tighten your buttocks muscles and lift your buttocks off the floor until your trunk is level with your thighs. ? Do not arch  your back. ? You should feel the muscles working in your buttocks and the back of your thighs. If you do not feel these muscles, slide your feet 1-2 inches (2.5-5 cm) farther away from your buttocks. ? If this exercise is too easy, try doing it with your arms crossed over your chest. 3. Hold this position for __________ seconds. 4. Slowly lower your hips to the starting position. 5. Let your muscles relax completely after each repetition. Repeat __________ times. Complete this exercise __________ times a day. Squats This exercise strengthens the muscles in front of your thigh and knee (quadriceps). 1. Stand in front of a table, with your feet and knees pointing straight ahead. You may rest your hands on the table for balance but not for support. 2. Slowly bend your knees and lower your hips like you are going to sit in a chair. ? Keep your weight over your heels, not over your toes. ? Keep your lower legs upright so they are parallel with the table legs. ? Do not let your hips go lower than your knees. ? Do not bend lower than told by your health care provider. ? If your hip pain increases, do not bend as low. 3. Hold the squat position for __________ seconds. 4. Slowly push with your legs to return to standing. Do not use your hands to pull yourself to standing. Repeat __________ times. Complete this exercise __________ times a day. Hip hike 1. Stand   sideways on a bottom step. Stand on your left / right leg with your other foot unsupported next to the step. You can hold on to the railing or wall for balance if needed. 2. Keep your knees straight and your torso square. Then lift your left / right hip up toward the ceiling. 3. Hold this position for __________ seconds. 4. Slowly let your left / right hip lower toward the floor, past the starting position. Your foot should get closer to the floor. Do not lean or bend your knees. Repeat __________ times. Complete this exercise __________ times a  day. Single leg stand 1. Without shoes, stand near a railing or in a doorway. You may hold on to the railing or door frame as needed for balance. 2. Squeeze your left / right buttock muscles, then lift up your other foot. ? Do not let your left / right hip push out to the side. ? It is helpful to stand in front of a mirror for this exercise so you can watch your hip. 3. Hold this position for __________ seconds. Repeat __________ times. Complete this exercise __________ times a day. This information is not intended to replace advice given to you by your health care provider. Make sure you discuss any questions you have with your health care provider. Document Revised: 11/06/2018 Document Reviewed: 11/06/2018 Elsevier Patient Education  2020 Elsevier Inc.  

## 2020-05-15 NOTE — Addendum Note (Signed)
Addended by: Earnestine Mealing on: 05/15/2020 01:15 PM   Modules accepted: Orders

## 2020-05-16 DIAGNOSIS — E039 Hypothyroidism, unspecified: Secondary | ICD-10-CM | POA: Diagnosis not present

## 2020-05-16 DIAGNOSIS — M810 Age-related osteoporosis without current pathological fracture: Secondary | ICD-10-CM | POA: Diagnosis not present

## 2020-05-16 DIAGNOSIS — I1310 Hypertensive heart and chronic kidney disease without heart failure, with stage 1 through stage 4 chronic kidney disease, or unspecified chronic kidney disease: Secondary | ICD-10-CM | POA: Diagnosis not present

## 2020-05-16 DIAGNOSIS — E785 Hyperlipidemia, unspecified: Secondary | ICD-10-CM | POA: Diagnosis not present

## 2020-05-23 DIAGNOSIS — M810 Age-related osteoporosis without current pathological fracture: Secondary | ICD-10-CM | POA: Diagnosis not present

## 2020-05-23 DIAGNOSIS — I1310 Hypertensive heart and chronic kidney disease without heart failure, with stage 1 through stage 4 chronic kidney disease, or unspecified chronic kidney disease: Secondary | ICD-10-CM | POA: Diagnosis not present

## 2020-05-23 DIAGNOSIS — I2510 Atherosclerotic heart disease of native coronary artery without angina pectoris: Secondary | ICD-10-CM | POA: Diagnosis not present

## 2020-05-23 DIAGNOSIS — I480 Paroxysmal atrial fibrillation: Secondary | ICD-10-CM | POA: Diagnosis not present

## 2020-05-23 DIAGNOSIS — R413 Other amnesia: Secondary | ICD-10-CM | POA: Diagnosis not present

## 2020-05-23 DIAGNOSIS — D72819 Decreased white blood cell count, unspecified: Secondary | ICD-10-CM | POA: Diagnosis not present

## 2020-05-23 DIAGNOSIS — Z853 Personal history of malignant neoplasm of breast: Secondary | ICD-10-CM | POA: Diagnosis not present

## 2020-05-23 DIAGNOSIS — E039 Hypothyroidism, unspecified: Secondary | ICD-10-CM | POA: Diagnosis not present

## 2020-05-23 DIAGNOSIS — N1831 Chronic kidney disease, stage 3a: Secondary | ICD-10-CM | POA: Diagnosis not present

## 2020-05-23 DIAGNOSIS — M3210 Systemic lupus erythematosus, organ or system involvement unspecified: Secondary | ICD-10-CM | POA: Diagnosis not present

## 2020-05-23 DIAGNOSIS — Z0000 Encounter for general adult medical examination without abnormal findings: Secondary | ICD-10-CM | POA: Diagnosis not present

## 2020-05-23 DIAGNOSIS — R82998 Other abnormal findings in urine: Secondary | ICD-10-CM | POA: Diagnosis not present

## 2020-05-23 DIAGNOSIS — F325 Major depressive disorder, single episode, in full remission: Secondary | ICD-10-CM | POA: Diagnosis not present

## 2020-05-26 ENCOUNTER — Telehealth: Payer: Self-pay

## 2020-05-26 NOTE — Telephone Encounter (Signed)
Patient requested a return call with Dr. Arlean Hopping recommendation on the Covid booster.

## 2020-05-26 NOTE — Telephone Encounter (Signed)
Reviewed the following with patient:   COVID-19 vaccine recommendations:   COVID-19 vaccine is recommended for everyone (unless you are allergic to a vaccine component), even if you are on a medication that suppresses your immune system.   If you are on Methotrexate, Cellcept (mycophenolate), Rinvoq, Morrie Sheldon, and Olumiant- hold the medication for 1 week after each vaccine. Hold Methotrexate for 2 weeks after the single dose COVID-19 vaccine.   If you are on Orencia subcutaneous injection - hold medication one week prior to and one week after the first COVID-19 vaccine dose (only).   If you are on Orencia IV infusions- time vaccination administration so that the first COVID-19 vaccination will occur four weeks after the infusion and postpone the subsequent infusion by one week.   If you are on Cyclophosphamide or Rituxan infusions please contact your doctor prior to receiving the COVID-19 vaccine.   Do not take Tylenol or any anti-inflammatory medications (NSAIDs) 24 hours prior to the COVID-19 vaccination.   There is no direct evidence about the efficacy of the COVID-19 vaccine in individuals who are on medications that suppress the immune system.   Even if you are fully vaccinated, and you are on any medications that suppress your immune system, please continue to wear a mask, maintain at least six feet social distance and practice hand hygiene.   If you develop a COVID-19 infection, please contact your PCP or our office to determine if you need monoclonal antibody infusion.  The booster vaccine is now available for immunocompromised patients.   Please see the following web sites for updated information.   [email or web address]-[email or web address]

## 2020-05-26 NOTE — Telephone Encounter (Signed)
Attempted to contact the patient and left message for patient to call the office.  

## 2020-05-30 ENCOUNTER — Telehealth: Payer: Self-pay | Admitting: *Deleted

## 2020-05-30 NOTE — Telephone Encounter (Signed)
Labs Received from [facility] by Dr. Estanislado Pandy  Drawn on 05/26/2020  WBC 3.49, PRevious WBC 3.5  All other labs WNL.  Patient on PLQ 200 mg BID M-F.

## 2020-06-09 ENCOUNTER — Other Ambulatory Visit: Payer: Self-pay | Admitting: Rheumatology

## 2020-06-09 DIAGNOSIS — M359 Systemic involvement of connective tissue, unspecified: Secondary | ICD-10-CM

## 2020-06-09 DIAGNOSIS — Z79899 Other long term (current) drug therapy: Secondary | ICD-10-CM

## 2020-06-23 ENCOUNTER — Telehealth: Payer: Self-pay

## 2020-06-23 NOTE — Telephone Encounter (Signed)
Spoke with patient and she states she last picked up her prescription up in August 2021. Prescription was sent on 05/16/2020 for a 90 day supply. Contacted pharmacy to follow up on prescription. The pharmacy conformed they have the prescription from 05/16/2020 on file and they are getting it ready for the patient.

## 2020-06-23 NOTE — Telephone Encounter (Signed)
Patient left voicemail requesting prescription refill of Hydroxychloroquine to be sent to [facility] at [address]. [address].  Patient states she is almost out of tablets.  Patient states [facility] told her she needed to contact our office to get approval for refill request.

## 2020-06-25 ENCOUNTER — Ambulatory Visit (AMBULATORY_SURGERY_CENTER): Payer: PPO | Admitting: Gastroenterology

## 2020-06-25 ENCOUNTER — Other Ambulatory Visit: Payer: Self-pay

## 2020-06-25 ENCOUNTER — Encounter: Payer: Self-pay | Admitting: Gastroenterology

## 2020-06-25 VITALS — BP 177/79 | HR 61 | Temp 97.1°F | Resp 17 | Ht 64.0 in | Wt 146.0 lb

## 2020-06-25 DIAGNOSIS — R1319 Other dysphagia: Secondary | ICD-10-CM

## 2020-06-25 DIAGNOSIS — K2950 Unspecified chronic gastritis without bleeding: Secondary | ICD-10-CM | POA: Diagnosis not present

## 2020-06-25 DIAGNOSIS — K2100 Gastro-esophageal reflux disease with esophagitis, without bleeding: Secondary | ICD-10-CM | POA: Diagnosis not present

## 2020-06-25 DIAGNOSIS — R1310 Dysphagia, unspecified: Secondary | ICD-10-CM | POA: Diagnosis not present

## 2020-06-25 DIAGNOSIS — K319 Disease of stomach and duodenum, unspecified: Secondary | ICD-10-CM

## 2020-06-25 DIAGNOSIS — K5730 Diverticulosis of large intestine without perforation or abscess without bleeding: Secondary | ICD-10-CM | POA: Diagnosis not present

## 2020-06-25 DIAGNOSIS — K5909 Other constipation: Secondary | ICD-10-CM

## 2020-06-25 DIAGNOSIS — K449 Diaphragmatic hernia without obstruction or gangrene: Secondary | ICD-10-CM | POA: Diagnosis not present

## 2020-06-25 DIAGNOSIS — K5900 Constipation, unspecified: Secondary | ICD-10-CM | POA: Diagnosis not present

## 2020-06-25 MED ORDER — OMEPRAZOLE 20 MG PO CPDR: 20.0000 mg | DELAYED_RELEASE_CAPSULE | Freq: Every day | ORAL | 1 refills | Status: DC

## 2020-06-25 MED ORDER — SODIUM CHLORIDE 0.9 % IV SOLN: 500.0000 mL | INTRAVENOUS | Status: DC

## 2020-06-25 NOTE — Op Note (Signed)
[facility] Patient Name: Alice Medina Procedure Date: 06/25/2020 1:18 PM MRN: [id number] Endoscopist: Mallie Mussel L. Loletha Carrow , MD Age: 79 Referring MD:  Date of Birth: [birth year] Gender: Female Account #: [id number] Procedure:                Colonoscopy Indications:              Change in bowel habits (worsening of chronic                            constipation starting early this year) Medicines:                Monitored Anesthesia Care Procedure:                Pre-Anesthesia Assessment:                           - Prior to the procedure, a History and Physical                            was performed, and patient medications and                            allergies were reviewed. The patient's tolerance of                            previous anesthesia was also reviewed. The risks                            and benefits of the procedure and the sedation                            options and risks were discussed with the patient.                            All questions were answered, and informed consent                            was obtained. Prior Anticoagulants: The patient has                            taken no previous anticoagulant or antiplatelet                            agents. ASA Grade Assessment: II - A patient with                            mild systemic disease. After reviewing the risks                            and benefits, the patient was deemed in                            satisfactory condition to undergo the procedure.  After obtaining informed consent, the colonoscope                            was passed under direct vision. Throughout the                            procedure, the patient's blood pressure, pulse, and                            oxygen saturations were monitored continuously. The                            Colonoscope was introduced through the anus and                            advanced to the the  cecum, identified by                            appendiceal orifice and ileocecal valve. The                            colonoscopy was performed without difficulty. The                            patient tolerated the procedure well. The quality                            of the bowel preparation was good. The ileocecal                            valve, appendiceal orifice, and rectum were                            photographed. Scope In: 1:40:29 PM Scope Out: 1:52:06 PM Scope Withdrawal Time: 0 hours 6 minutes 25 seconds  Total Procedure Duration: 0 hours 11 minutes 37 seconds  Findings:                 The digital rectal exam findings include decreased                            sphincter tone.                           Many diverticula were found in the left colon with                            associated tortuosity.                           The exam was otherwise without abnormality on                            direct and retroflexion views. Complications:            No immediate complications. Estimated Blood Loss:  Estimated blood loss: none. Impression:               - Decreased sphincter tone found on digital rectal                            exam.                           - Diverticulosis in the left colon.                           - The examination was otherwise normal on direct                            and retroflexion views.                           - No specimens collected.                           Bowel habit change most likely due to                            diverticulosis and perhaps side effect of                            duloxetine. Recommendation:           - Patient has a contact number available for                            emergencies. The signs and symptoms of potential                            delayed complications were discussed with the                            patient. Return to normal activities tomorrow.                             Written discharge instructions were provided to the                            patient.                           - Resume previous diet.                           - Continue present medications, including one half                            to one full cap/packet miralax daily.                           - No repeat routine screening colonoscopy due to  age and current guidelines.                           - See the other procedure note for documentation of                            additional recommendations. Yasmen Cortner L. Loletha Carrow, MD 06/25/2020 2:19:01 PM This report has been signed electronically.

## 2020-06-25 NOTE — Patient Instructions (Signed)
Handouts given for Diverticulosis and Gastritis.  Await pathology results.  Pick up new prescription today at your pharmacy.  YOU HAD AN ENDOSCOPIC PROCEDURE TODAY AT [facility]:   Refer to the procedure report that was given to you for any specific questions about what was found during the examination.  If the procedure report does not answer your questions, please call your gastroenterologist to clarify.  If you requested that your care partner not be given the details of your procedure findings, then the procedure report has been included in a sealed envelope for you to review at your convenience later.  YOU SHOULD EXPECT: Some feelings of bloating in the abdomen. Passage of more gas than usual.  Walking can help get rid of the air that was put into your GI tract during the procedure and reduce the bloating. If you had a lower endoscopy (such as a colonoscopy or flexible sigmoidoscopy) you may notice spotting of blood in your stool or on the toilet paper. If you underwent a bowel prep for your procedure, you may not have a normal bowel movement for a few days.  Please Note:  You might notice some irritation and congestion in your nose or some drainage.  This is from the oxygen used during your procedure.  There is no need for concern and it should clear up in a day or so.  SYMPTOMS TO REPORT IMMEDIATELY:   Following lower endoscopy (colonoscopy or flexible sigmoidoscopy):  Excessive amounts of blood in the stool  Significant tenderness or worsening of abdominal pains  Swelling of the abdomen that is new, acute  Fever of 100F or higher   Following upper endoscopy (EGD)  Vomiting of blood or coffee ground material  New chest pain or pain under the shoulder blades  Painful or persistently difficult swallowing  New shortness of breath  Fever of 100F or higher  Black, tarry-looking stools  For urgent or emergent issues, a gastroenterologist can be reached at any hour  by calling [phone]. Do not use MyChart messaging for urgent concerns.    DIET:  We do recommend a small meal at first, but then you may proceed to your regular diet.  Drink plenty of fluids but you should avoid alcoholic beverages for 24 hours.  ACTIVITY:  You should plan to take it easy for the rest of today and you should NOT DRIVE or use heavy machinery until tomorrow (because of the sedation medicines used during the test).    FOLLOW UP: Our staff will call the number listed on your records 48-72 hours following your procedure to check on you and address any questions or concerns that you may have regarding the information given to you following your procedure. If we do not reach you, we will leave a message.  We will attempt to reach you two times.  During this call, we will ask if you have developed any symptoms of COVID 19. If you develop any symptoms (ie: fever, flu-like symptoms, shortness of breath, cough etc.) before then, please call [phone].  If you test positive for Covid 19 in the 2 weeks post procedure, please call and report this information to [name].    If any biopsies were taken you will be contacted by phone or by letter within the next 1-3 weeks.  Please call us at [phone] if you have not heard about the biopsies in 3 weeks.    SIGNATURES/CONFIDENTIALITY: You and/or your care partner have signed paperwork which will  be entered into your electronic medical record.  These signatures attest to the fact that that the information above on your After Visit Summary has been reviewed and is understood.  Full responsibility of the confidentiality of this discharge information lies with you and/or your care-partner. 

## 2020-06-25 NOTE — Progress Notes (Signed)
VS- Alice Medina  Per pt- pt has been told by her MD that it is ok to put IV into right arm

## 2020-06-25 NOTE — Progress Notes (Signed)
A/ox3, pleased with MAC, report to RN 

## 2020-06-25 NOTE — Progress Notes (Signed)
Called to room to assist during endoscopic procedure.  Patient ID and intended procedure confirmed with present staff. Received instructions for my participation in the procedure from the performing physician.  

## 2020-06-25 NOTE — Op Note (Signed)
[facility] Patient Name: Alice Medina Procedure Date: 06/25/2020 1:17 PM MRN: [id number] Endoscopist: Mallie Mussel L. Loletha Carrow , MD Age: 79 Referring MD:  Date of Birth: 12-13-1940 Gender: Female Account #: [id number] Procedure:                Upper GI endoscopy Indications:              Esophageal dysphagia Medicines:                Monitored Anesthesia Care Procedure:                Pre-Anesthesia Assessment:                           - Prior to the procedure, a History and Physical                            was performed, and patient medications and                            allergies were reviewed. The patient's tolerance of                            previous anesthesia was also reviewed. The risks                            and benefits of the procedure and the sedation                            options and risks were discussed with the patient.                            All questions were answered, and informed consent                            was obtained. Prior Anticoagulants: The patient has                            taken no previous anticoagulant or antiplatelet                            agents. ASA Grade Assessment: II - A patient with                            mild systemic disease. After reviewing the risks                            and benefits, the patient was deemed in                            satisfactory condition to undergo the procedure.                           After obtaining informed consent, the endoscope was  passed under direct vision. Throughout the                            procedure, the patient's blood pressure, pulse, and                            oxygen saturations were monitored continuously. The                            Endoscope was introduced through the mouth, and                            advanced to the second part of duodenum. The upper                            GI endoscopy was accomplished  without difficulty.                            The patient tolerated the procedure well. Scope In: Scope Out: Findings:                 LA Grade A (one or more mucosal breaks less than 5                            mm, not extending between tops of 2 mucosal folds)                            esophagitis was found at the gastroesophageal                            junction.                           A small (1-2cm) hiatal hernia was present.                           A widely patent Schatzki ring was found at the                            gastroesophageal junction. This was biopsied with a                            cold forceps for ring disruption.                           Patchy mild inflammation characterized by erosions                            and erythema was found in the gastric antrum.                            Biopsies were taken with a cold forceps for  histology. ([place] protocol).                           The exam of the stomach was otherwise normal.                           The cardia and gastric fundus were normal on                            retroflexion.                           The examined duodenum was normal. Complications:            No immediate complications. Estimated Blood Loss:     Estimated blood loss was minimal. Impression:               - LA Grade A reflux esophagitis.                           - Small hiatal hernia.                           - Widely patent Schatzki ring. Biopsied.                           - Gastritis. Biopsied.                           - Normal examined duodenum.                           Dysphagia appears to be from a combination of mild                            ring and reflux-related esophageal dysmotility. Recommendation:           - Patient has a contact number available for                            emergencies. The signs and symptoms of potential                            delayed complications  were discussed with the                            patient. Return to normal activities tomorrow.                            Written discharge instructions were provided to the                            patient.                           - Resume previous diet.                           -  Continue present medications.                           - Await pathology results.                           - Use Prilosec (omeprazole) 20 mg PO daily.                            Disp#30, RF 1                           - Return to my office at appointment to be                            scheduled. Charelle Petrakis L. Loletha Carrow, MD 06/25/2020 2:24:43 PM This report has been signed electronically.

## 2020-06-27 ENCOUNTER — Telehealth: Payer: Self-pay | Admitting: *Deleted

## 2020-06-27 ENCOUNTER — Ambulatory Visit: Payer: PPO | Attending: Internal Medicine

## 2020-06-27 DIAGNOSIS — Z23 Encounter for immunization: Secondary | ICD-10-CM

## 2020-06-27 NOTE — Telephone Encounter (Signed)
Follow up call made. Left message

## 2020-06-27 NOTE — Progress Notes (Signed)
   Covid-19 Vaccination Clinic  Name:  Alice Medina    MRN: [id number] DOB: 11-18-1940  06/27/2020  [name] was observed post Covid-19 immunization for 15 minutes without incident. She was provided with Vaccine Information Sheet and instruction to access the V-Safe system.   [name] was instructed to call 911 with any severe reactions post vaccine: Marland Kitchen Difficulty breathing  . Swelling of face and throat  . A fast heartbeat  . A bad rash all over body  . Dizziness and weakness   Immunizations Administered    Name Date Dose VIS Date Route   Pfizer COVID-19 Vaccine 06/27/2020  2:47 PM 0.3 mL 05/14/2020 Intramuscular   Manufacturer: [place]   Lot: [id number]   [place]: [id number]

## 2020-06-27 NOTE — Telephone Encounter (Signed)
  Follow up Call-  Call back number 06/25/2020  Post procedure Call Back phone  # [phone]  Permission to leave phone message Yes  Some recent data might be hidden     Patient questions:  Message left to call if necessary.  Second call.

## 2020-07-01 ENCOUNTER — Encounter: Payer: Self-pay | Admitting: Gastroenterology

## 2020-07-01 ENCOUNTER — Telehealth: Payer: Self-pay | Admitting: Gastroenterology

## 2020-07-01 DIAGNOSIS — H31091 Other chorioretinal scars, right eye: Secondary | ICD-10-CM | POA: Diagnosis not present

## 2020-07-01 DIAGNOSIS — H353131 Nonexudative age-related macular degeneration, bilateral, early dry stage: Secondary | ICD-10-CM | POA: Diagnosis not present

## 2020-07-01 DIAGNOSIS — H35373 Puckering of macula, bilateral: Secondary | ICD-10-CM | POA: Diagnosis not present

## 2020-07-01 DIAGNOSIS — H35433 Paving stone degeneration of retina, bilateral: Secondary | ICD-10-CM | POA: Diagnosis not present

## 2020-07-01 NOTE — Telephone Encounter (Signed)
Patient returning your call in regards to her results.

## 2020-07-01 NOTE — Telephone Encounter (Signed)
Spoke with patient, see 06/25/20 result note for more information.

## 2020-07-07 ENCOUNTER — Ambulatory Visit (HOSPITAL_COMMUNITY): Payer: PPO | Admitting: Physician Assistant

## 2020-08-14 DIAGNOSIS — Z20828 Contact with and (suspected) exposure to other viral communicable diseases: Secondary | ICD-10-CM | POA: Diagnosis not present

## 2020-08-21 ENCOUNTER — Ambulatory Visit: Payer: PPO | Admitting: Gastroenterology

## 2020-08-21 ENCOUNTER — Encounter: Payer: Self-pay | Admitting: Gastroenterology

## 2020-08-21 ENCOUNTER — Other Ambulatory Visit: Payer: Self-pay

## 2020-08-21 VITALS — BP 100/60 | HR 58 | Ht 65.5 in | Wt 143.0 lb

## 2020-08-21 DIAGNOSIS — K5909 Other constipation: Secondary | ICD-10-CM | POA: Diagnosis not present

## 2020-08-21 DIAGNOSIS — K449 Diaphragmatic hernia without obstruction or gangrene: Secondary | ICD-10-CM | POA: Diagnosis not present

## 2020-08-21 DIAGNOSIS — K2100 Gastro-esophageal reflux disease with esophagitis, without bleeding: Secondary | ICD-10-CM | POA: Diagnosis not present

## 2020-08-21 DIAGNOSIS — K5730 Diverticulosis of large intestine without perforation or abscess without bleeding: Secondary | ICD-10-CM

## 2020-08-21 DIAGNOSIS — K222 Esophageal obstruction: Secondary | ICD-10-CM | POA: Diagnosis not present

## 2020-08-21 DIAGNOSIS — R1319 Other dysphagia: Secondary | ICD-10-CM

## 2020-08-21 NOTE — Progress Notes (Signed)
[facility] Progress Note  Chief Complaint: Dysphagia  Subjective  History: On 06/25/2020, colonoscopy for change in bowel habits, significant left-sided diverticulosis noted.  Side effect of duloxetine was also considered as a possible contributing factor. EGD same day for esophageal dysphagia: Mild reflux esophagitis, small hiatal hernia, and widely patent Schatzki ring.  The ring was disrupted with biopsies.  Mild patchy antral erythema, biopsy negative for H. pylori.  Patient was started on omeprazole 20 mg daily, with clinical suspicion of reflux related dysmotility.  Alice Medina is glad to report that she has been feeling well since I last saw her.  She took the omeprazole daily for a few weeks and then stopped because she would prefer not to be on any more long-term meds if possible.  She no longer has dysphagia, she is also being more careful and eating more slowly.  She had an episode of heartburn and regurgitation a few evenings ago when she ate late. Half a capful of MiraLAX 2 or 3 days a week has been relieving her constipation.  ROS: Cardiovascular:  no chest pain Respiratory: no dyspnea  The patient's Past Medical, Family and Social History were reviewed and are on file in the EMR.  Objective:  Med list reviewed  Current Outpatient Medications:  .  amLODipine (NORVASC) 2.5 MG tablet, TAKE 1 TABLET BY MOUTH EVERY DAY, Disp: 90 tablet, Rfl: 3 .  aspirin 325 MG tablet, Take 325 mg by mouth daily., Disp: , Rfl:  .  Budesonide-Formoterol Fumarate (SYMBICORT IN), Inhale into the lungs as needed. , Disp: , Rfl:  .  cefdinir (OMNICEF) 300 MG capsule, , Disp: , Rfl:  .  cholecalciferol (VITAMIN D) 1000 UNITS tablet, Take 1,000 Units by mouth daily., Disp: , Rfl:  .  cyclobenzaprine (FLEXERIL) 10 MG tablet, Take 10 mg by mouth at bedtime as needed for muscle spasms., Disp: , Rfl:  .  cycloSPORINE (RESTASIS) 0.05 % ophthalmic emulsion, Place 1 drop into both eyes 2 (two) times  daily., Disp: , Rfl:  .  diclofenac Sodium (VOLTAREN) 1 % GEL, Apply 2-4 grams to affected joint 4 times daily as needed., Disp: 400 g, Rfl: 2 .  DULoxetine (CYMBALTA) 60 MG capsule, Take 60 mg by mouth daily., Disp: , Rfl:  .  hydroxychloroquine (PLAQUENIL) 200 MG tablet, TAKE 1 TABLET BY MOUTH TWICE DAILY ON MONDAY THROUGH FRIDAY ONLY, Disp: 120 tablet, Rfl: 0 .  levothyroxine (SYNTHROID, LEVOTHROID) 50 MCG tablet, Take 50 mcg by mouth daily., Disp: , Rfl:  .  LORazepam (ATIVAN) 2 MG tablet, Take 2 mg by mouth at bedtime., Disp: , Rfl:  .  losartan (COZAAR) 100 MG tablet, TAKE 1 TABLET(100 MG) BY MOUTH DAILY, Disp: 90 tablet, Rfl: 3 .  Multiple Vitamins-Minerals (MULTIVITAMIN WITH MINERALS) tablet, Take 1 tablet by mouth daily., Disp: , Rfl:  .  omeprazole (PRILOSEC) 20 MG capsule, Take 1 capsule (20 mg total) by mouth daily., Disp: 30 capsule, Rfl: 1 .  raloxifene (EVISTA) 60 MG tablet, Take 60 mg by mouth daily. three times a week, Disp: , Rfl:  .  sotalol (BETAPACE) 80 MG tablet, TAKE 1 TABLET(80 MG) BY MOUTH TWICE DAILY, Disp: 180 tablet, Rfl: 3   Vital signs in last 24 hrs: Vitals:   08/21/20 1041  BP: 100/60  Pulse: (!) 58   Wt Readings from Last 3 Encounters:  08/21/20 143 lb (64.9 kg)  06/25/20 146 lb (66.2 kg)  05/15/20 146 lb 6.4 oz (66.4 kg)  No exam-entire visit spent in record review and discussion.  Labs:   ___________________________________________ Radiologic studies:   ____________________________________________ Other: Biopsy results as noted above  _____________________________________________ Assessment & Plan  Assessment: Encounter Diagnoses  Name Primary?  . Gastroesophageal reflux disease with esophagitis without hemorrhage Yes  . Esophageal dysphagia   . Hiatal hernia   . Schatzki's ring   . Chronic constipation   . Diverticulosis of colon without hemorrhage    I think her dysphagia was largely reflux related dysmotility, lesser degree of  mechanical cause since the Schatzki ring was widely patent.  She does not need to take PPI regularly as long as her reflux symptoms and dysphagia are under control.  If she notices a recurrence of dysphagia, she will take the omeprazole 20 mg daily for about 2 weeks and also call as needed for more advice. We discussed the limitation of acid suppression therapy as well as the breadth of diet and lifestyle changes required for reflux control.  Continue low-dose MiraLAX as needed to relieve constipation from diverticulosis.  I will be glad to see her as the need arises.  23 minutes were spent on this encounter (including chart review, history/exam, counseling/coordination of care, and documentation) > 50% of that time was spent on counseling and coordination of care.  Topics discussed included: GERD, hiatal hernia, constipation as noted above.  Alice Medina

## 2020-08-21 NOTE — Patient Instructions (Signed)
Please follow up as needed.  Thank you for trusting me with your gastrointestinal care!    Wilfrid Lund, MD

## 2020-09-16 IMAGING — DX DG CHEST 2V
2 series · 2 of 2 positions shown · non-contrast
Comparison: Chest radiograph 12/13/2017

CLINICAL DATA: Patient with cough.

EXAM:
CHEST - 2 VIEW

[dg chest 2 view (1 of 2)]
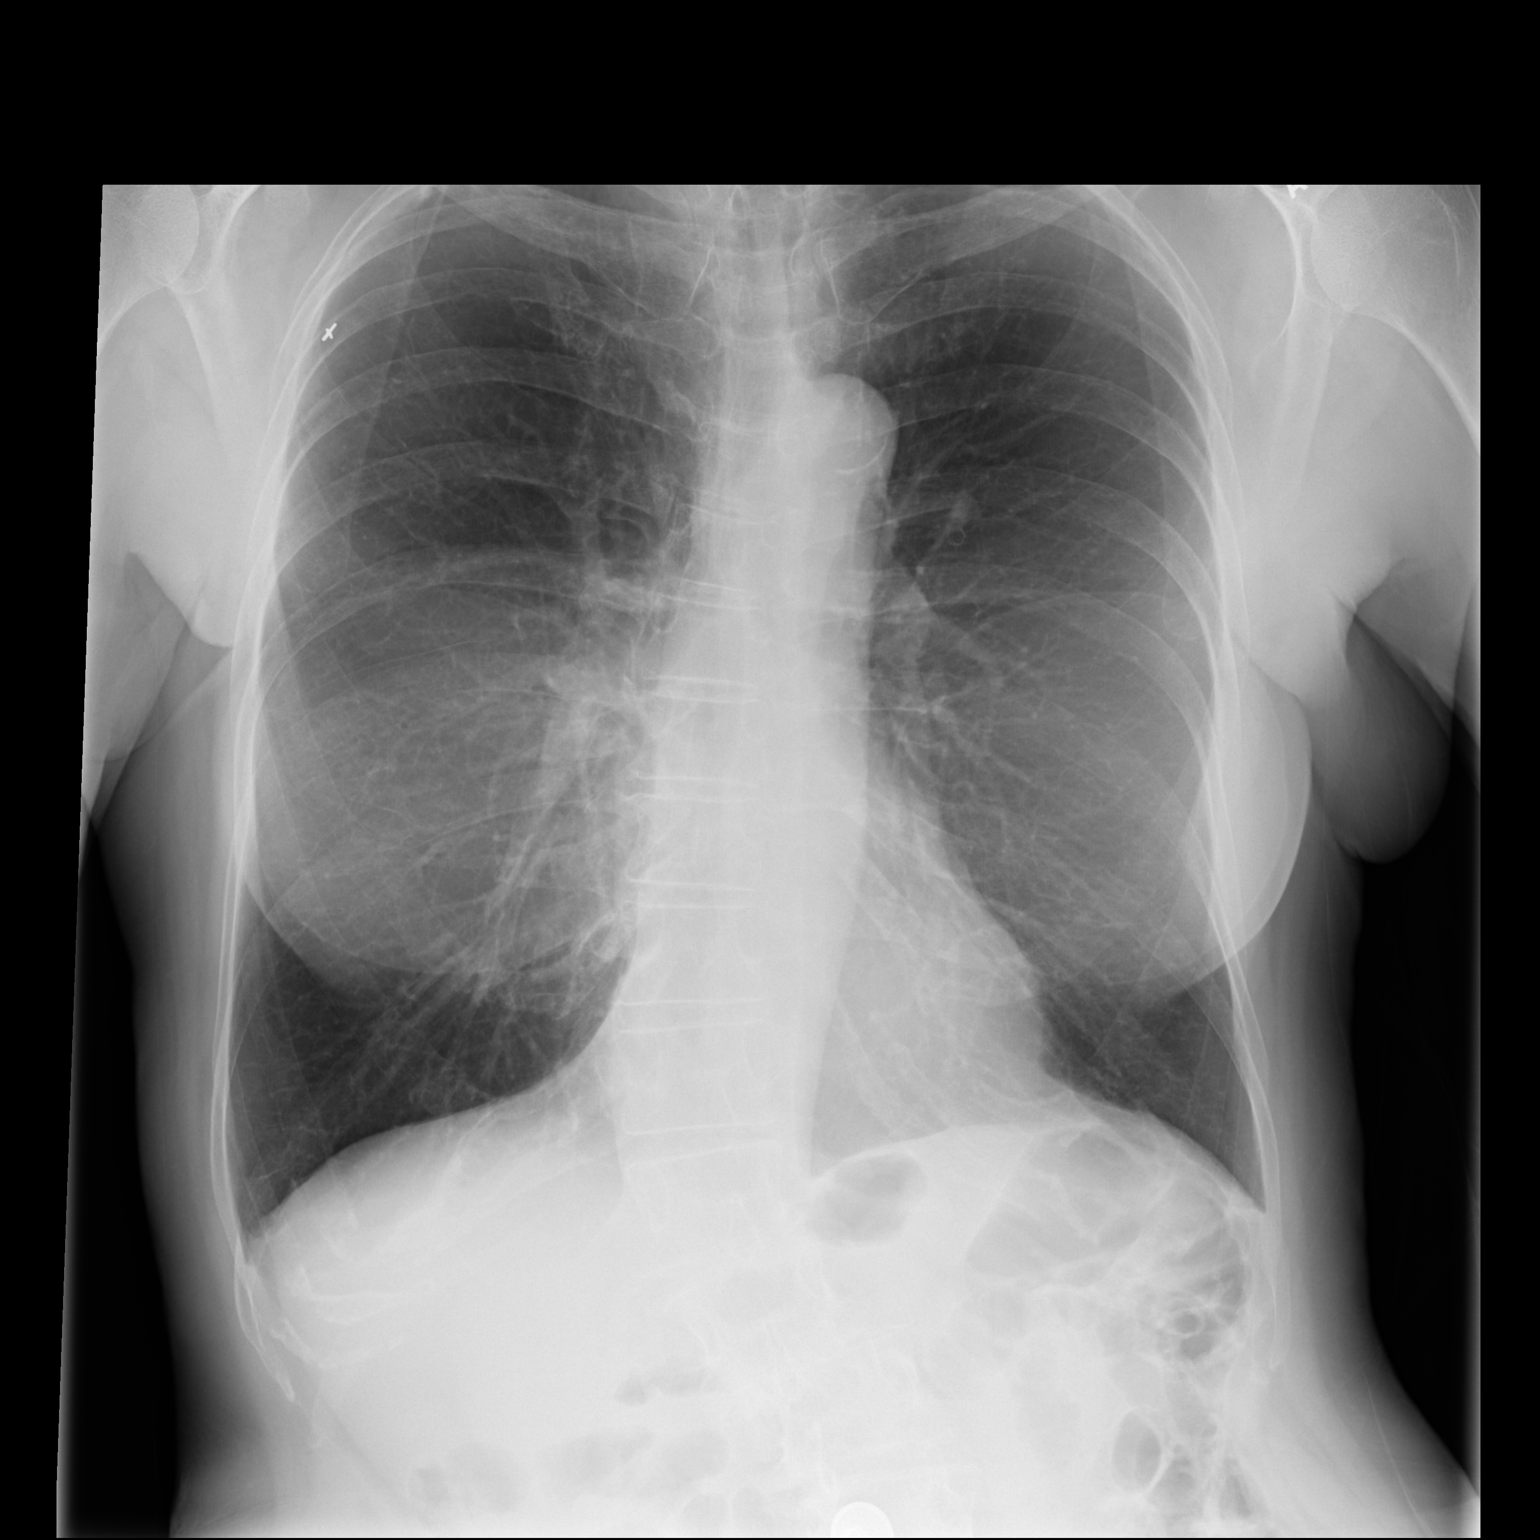

[dg chest 2 view (2 of 2)]
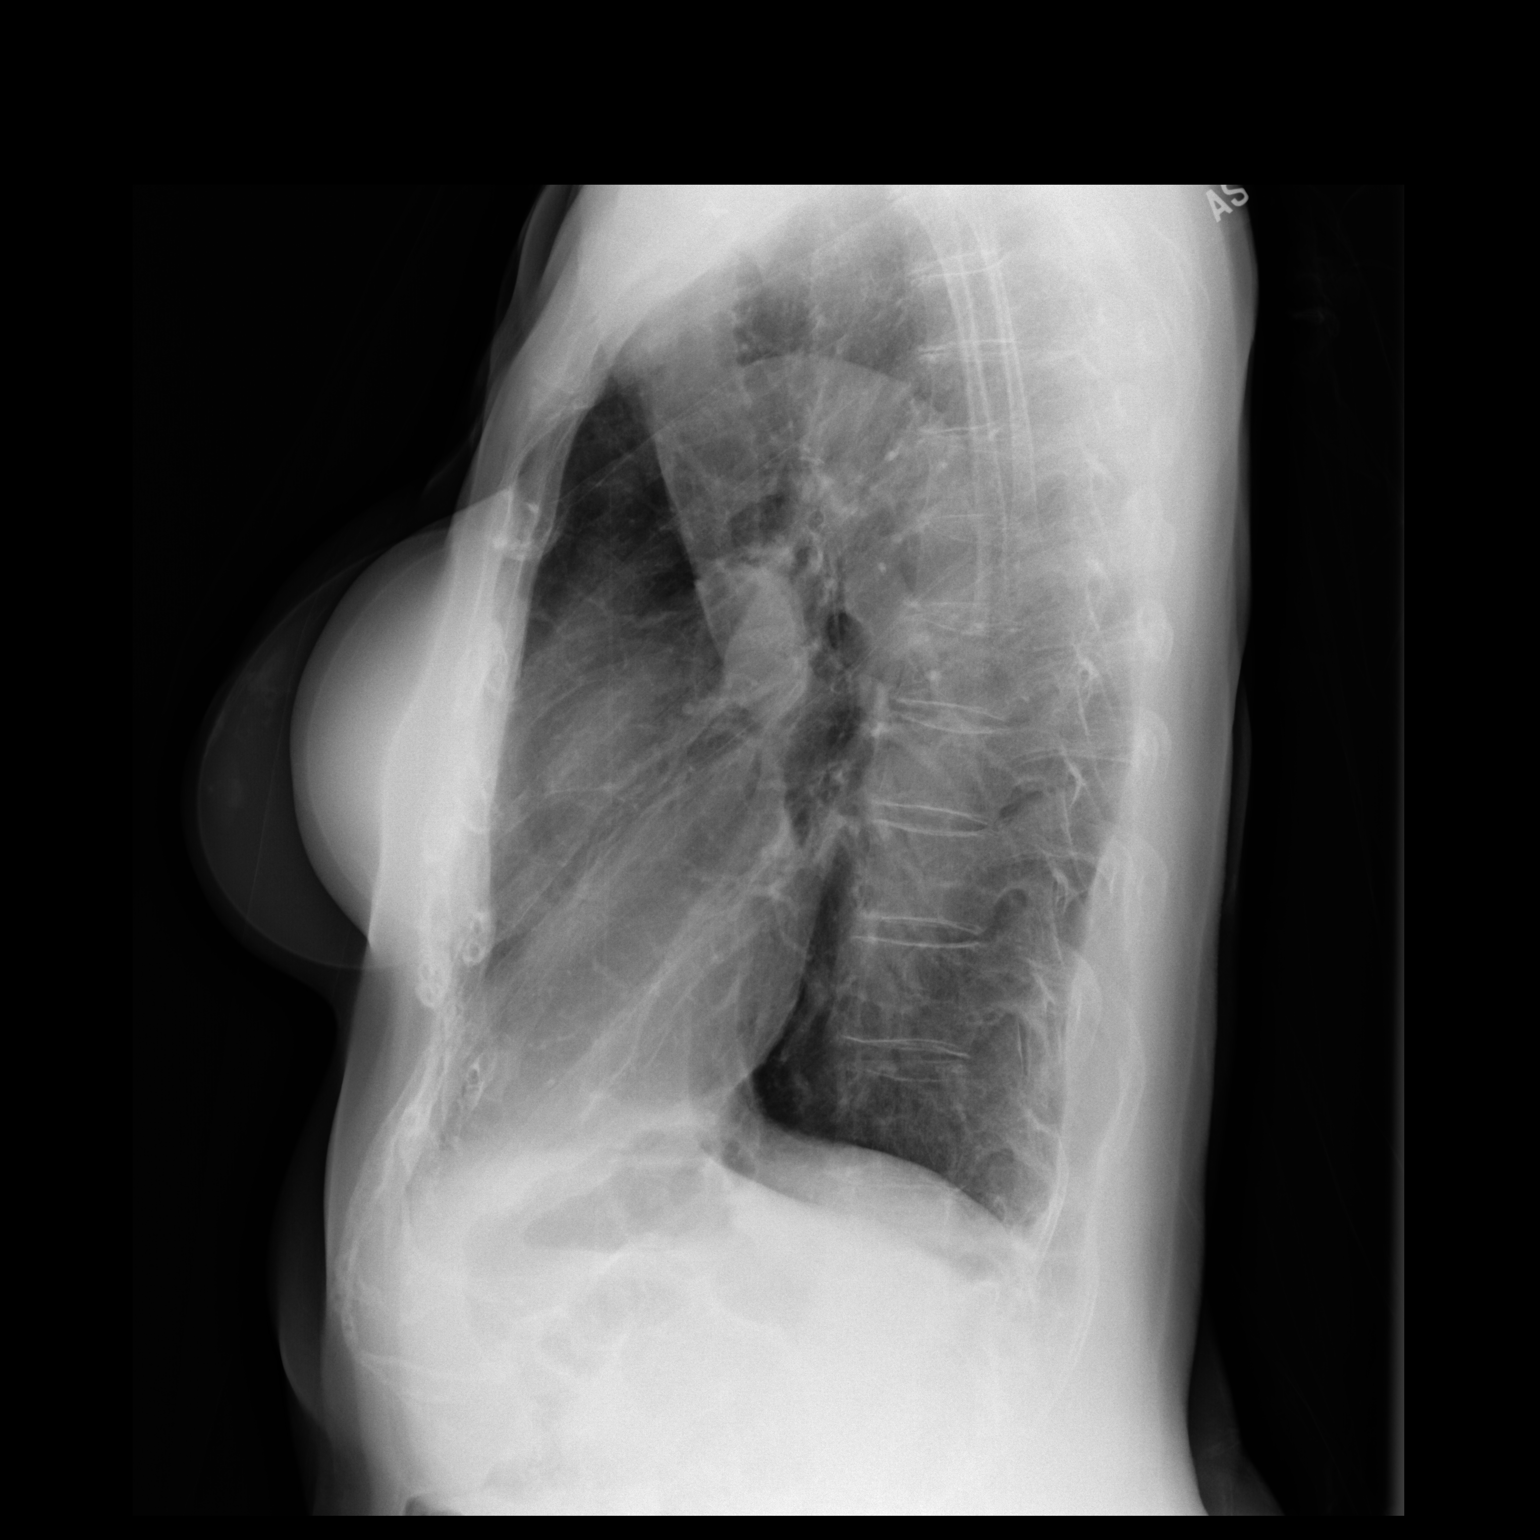

[2 of 2 positions shown; findings below may reference images not displayed]

FINDINGS: Normal cardiac and mediastinal contours. No consolidative pulmonary
opacities. No pleural effusion or pneumothorax. Regional skeleton is
unremarkable.
IMPRESSION: No acute cardiopulmonary process.

## 2020-09-22 ENCOUNTER — Other Ambulatory Visit: Payer: Self-pay | Admitting: *Deleted

## 2020-09-22 DIAGNOSIS — M359 Systemic involvement of connective tissue, unspecified: Secondary | ICD-10-CM

## 2020-09-22 DIAGNOSIS — Z79899 Other long term (current) drug therapy: Secondary | ICD-10-CM

## 2020-09-22 MED ORDER — HYDROXYCHLOROQUINE SULFATE 200 MG PO TABS: ORAL_TABLET | ORAL | 0 refills | Status: DC

## 2020-09-22 NOTE — Telephone Encounter (Addendum)
RX faxed from [facility]  Last Visit: 05/15/2020 Next Visit: 10/16/2020 Labs: 05/26/2020, WBC 3.49, Previous WBC 3.5, All other labs WNL Eye exam: 12/7/2021normal  Current Dose per office note 05/15/2020, Plaquenil 200 mg 1 tablet by mouth twice daily M-F CV:[id number] disease ([place]) +ANA +Ro +La +RF oral ulcers nasal ulcers Raynaud photosensitivity [facility]    Last Fill: 05/15/2020  Okay to refill Plaquenil?

## 2020-10-03 NOTE — Progress Notes (Signed)
Office Visit Note  Patient: Alice Medina             Date of Birth: [birth year]           MRN: [id number]             PCP: Shon Baton, MD Referring: Shon Baton, MD Visit Date: 10/16/2020 Occupation: @GUAROCC @  Subjective:  Medication management.   History of Present Illness: Alice Medina is a 80 y.o. female with a history of autoimmune disease, Sjogren's, Raynauds and osteoarthritis.  She states she continues to have Raynaud's phenomenon.  She feels  cold easily.  She continues to have sores in her mouth and nose infrequently.  She continues to have sicca symptoms.  She complains of discomfort in her hands or feet and knee joints.  She gets her DEXA scan through her PCP.  Activities of Daily Living:  Patient reports morning stiffness for 30 minutes.   Patient Reports nocturnal pain.  Difficulty dressing/grooming: Denies Difficulty climbing stairs: Reports Difficulty getting out of chair: Denies Difficulty using hands for taps, buttons, cutlery, and/or writing: Reports  Review of Systems  Constitutional: Negative for fatigue, night sweats, weight gain and weight loss.  HENT: Positive for mouth sores, mouth dryness and nose dryness. Negative for trouble swallowing and trouble swallowing.   Eyes: Positive for dryness. Negative for pain, redness, itching and visual disturbance.  Respiratory: Negative for cough, hemoptysis, shortness of breath and difficulty breathing.   Cardiovascular: Positive for palpitations and swelling in legs/feet. Negative for chest pain, hypertension and irregular heartbeat.  Gastrointestinal: Negative for abdominal pain, blood in stool, constipation and diarrhea.  Endocrine: Negative for increased urination.  Genitourinary: Negative for painful urination and vaginal dryness.  Musculoskeletal: Positive for arthralgias, joint pain, joint swelling, myalgias, muscle weakness, morning stiffness and myalgias. Negative for muscle tenderness.  Skin: Positive  for color change. Negative for rash, hair loss, redness, skin tightness, ulcers and sensitivity to sunlight.  Allergic/Immunologic: Negative for susceptible to infections.  Neurological: Positive for headaches and memory loss. Negative for dizziness, numbness, night sweats and weakness.  Hematological: Positive for bruising/bleeding tendency and swollen glands.  Psychiatric/Behavioral: Positive for depressed mood. Negative for confusion and sleep disturbance. The patient is nervous/anxious.     PMFS History:  Patient Active Problem List   Diagnosis Date Noted  . Paroxysmal atrial fibrillation ([place]) 04/07/2020  . Secondary hypercoagulable state ([place]) 04/07/2020  . Multiple closed fractures of metacarpal bone 07/25/2017  . Autoimmune disease ([place]) +ANA +Ro +La +RF oral ulcers nasal ulcers Raynaud photosensitivity SICCA  01/13/2017  . High risk medication use 01/13/2017  . Photosensitivity 01/13/2017  . Sjogren's syndrome with keratoconjunctivitis sicca ([place]) 01/13/2017  . Recurrent oral ulcers and nasal ulcers  01/13/2017  . Raynaud's disease without gangrene 01/13/2017  . Primary osteoarthritis of both hands 01/13/2017  . Primary osteoarthritis of both knees 01/13/2017  . Primary osteoarthritis of both feet 01/13/2017  . Osteopenia of multiple sites 01/13/2017  . History of hypertension 01/13/2017  . History of hypothyroidism 01/13/2017  . History of gastroesophageal reflux (GERD)/ PUD 01/13/2017  . History of MRSA infection 01/13/2017  . Diarrhea 05/28/2013  . Family history of malignant neoplasm of gastrointestinal tract 06/22/2011  . ADENOCARCINOMA, BREAST 05/02/2009  . CORONARY ARTERY DISEASE 05/02/2009  . DYSPHAGIA UNSPECIFIED 05/02/2009    Past Medical History:  Diagnosis Date  . ADENOCARCINOMA, BREAST 05/02/2009   Qualifier: Diagnosis of  By: Deatra Ina MD, Sandy Salaam   . Anxiety   . Arthritis   .  Atrial fibrillation ([place])   . Atrial fibrillation ([place])   . Autoimmune disorder  ([place])    SJORGREN'S  . Breast cancer ([place])    right breast  . Cardiac arrhythmia due to congenital heart disease   . Chronic headaches   . CORONARY ARTERY DISEASE 05/02/2009   Qualifier: Diagnosis of  By: Deatra Ina MD, Sandy Salaam   . Depression   . Diarrhea 05/28/2013  . DYSPHAGIA UNSPECIFIED 05/02/2009   Qualifier: Diagnosis of  By: Deatra Ina MD, Sandy Salaam   . Family history of malignant neoplasm of gastrointestinal tract 06/22/2011   Brother with colon cancer   . GERD (gastroesophageal reflux disease)   . Hemorrhage of rectum and anus 06/22/2011  . History of hypothyroidism 01/13/2017  . History of MRSA infection 01/13/2017  . Hypertension   . Multiple closed fractures of metacarpal bone 07/25/2017  . Myocardial infarction ([place])    1990  . Neuromuscular disorder ([place])    LUPUS  . Osteopenia of multiple sites 01/13/2017  . Osteoporosis   . PAC (premature atrial contraction) 04/07/2020  . Photosensitivity 01/13/2017  . Primary osteoarthritis of both hands 01/13/2017  . Raynaud's disease without gangrene 01/13/2017  . Recurrent oral ulcers and nasal ulcers  01/13/2017  . Sjogren's syndrome with keratoconjunctivitis sicca ([place]) 01/13/2017  . Status post dilation of esophageal narrowing   . Thyroid disease   . Ulcer     Family History  Problem Relation Age of Onset  . Breast cancer Mother   . Lung cancer Mother   . Cancer Father        larynx  . Heart failure Father   . Heart disease Brother   . Colon cancer Brother   . Pseudochol deficiency Other   . Breast cancer Sister   . Cancer Sister        thyroid   . Breast cancer Maternal Grandmother   . Autoimmune disease Daughter   . Cancer Son        prostate   . Diabetes Son   . Rheum arthritis Grandchild   . Esophageal cancer Neg Hx   . Rectal cancer Neg Hx   . Stomach cancer Neg Hx    Past Surgical History:  Procedure Laterality Date  . APPENDECTOMY    . breast reconstuction     x 2  . CHOLECYSTECTOMY    . COLONOSCOPY    .  double mastectomy    . PARTIAL HYSTERECTOMY  1990  . TIBIA FRACTURE SURGERY Right    with subsequent hardware removal  . UPPER GASTROINTESTINAL ENDOSCOPY    . WRIST FRACTURE SURGERY Left    Social History   Social History Narrative   1 cup of coffee daily   Immunization History  Administered Date(s) Administered  . PFIZER(Purple Top)SARS-COV-2 Vaccination 09/03/2019, 09/28/2019, 06/27/2020  . Zoster Recombinat (Shingrix) 01/10/2018, 05/11/2018     Objective: Vital Signs: BP 113/67 (BP Location: Left Arm, Patient Position: Sitting, Cuff Size: Normal)   Pulse 60   Ht 5' 5.5" (1.664 m)   Wt 143 lb 12.8 oz (65.2 kg)   BMI 23.57 kg/m    Physical Exam Vitals and nursing note reviewed.  Constitutional:      Appearance: She is well-developed.  HENT:     Head: Normocephalic and atraumatic.  Eyes:     Conjunctiva/sclera: Conjunctivae normal.  Cardiovascular:     Rate and Rhythm: Normal rate and regular rhythm.     Heart sounds: Normal heart sounds.  Pulmonary:  Effort: Pulmonary effort is normal.     Breath sounds: Normal breath sounds.  Abdominal:     General: Bowel sounds are normal.     Palpations: Abdomen is soft.  Musculoskeletal:     Cervical back: Normal range of motion.  Lymphadenopathy:     Cervical: No cervical adenopathy.  Skin:    General: Skin is warm and dry.     Capillary Refill: Capillary refill takes less than 2 seconds.  Neurological:     Mental Status: She is alert and oriented to person, place, and time.  Psychiatric:        Behavior: Behavior normal.      Musculoskeletal Exam: The spine was in good range of motion.  Shoulder joints, elbow joints, wrist joints with good range of motion.  She has PIP and DIP thickening.  Hip joints knee joints, ankles, MTPs and PIPs with good range of motion with no synovitis.  CDAI Exam: CDAI Score: - Patient Global: -; Provider Global: - Swollen: -; Tender: - Joint Exam 10/16/2020   No joint exam has been  documented for this visit   There is currently no information documented on the homunculus. Go to the Rheumatology activity and complete the homunculus joint exam.  Investigation: No additional findings.  Imaging: No results found.  Recent Labs: Lab Results  Component Value Date   WBC 3.4 (L) 01/16/2020   HGB 13.2 01/16/2020   PLT 194 01/16/2020   NA 134 (L) 12/13/2019   K 4.5 12/13/2019   CL 102 12/13/2019   CO2 25 12/13/2019   GLUCOSE 98 12/13/2019   BUN 21 12/13/2019   CREATININE 0.84 12/13/2019   BILITOT 0.4 12/13/2019   ALKPHOS 79 12/13/2017   AST 28 12/13/2019   ALT 15 12/13/2019   PROT 7.0 12/13/2019   PROT 7.2 12/13/2019   ALBUMIN 3.6 12/13/2017   CALCIUM 9.9 12/13/2019   GFRAA 77 12/13/2019    Speciality Comments: PLQ Eye Exam: 07/01/2020 normal @ [facility], [place]. 6 months  Procedures:  No procedures performed Allergies: Codeine, Robinul [glycopyrrolate], and Sulfamethoxazole-trimethoprim   Assessment / Plan:     Visit Diagnoses: Autoimmune disease ([place]) +ANA +Ro +La +RF oral ulcers nasal ulcers Raynaud photosensitivity SICCA  -she gives history of infrequent oral ulcers or nasal ulcers.  She continues to have sicca symptoms or Raynaud's phenomenon.  Her labs have been stable.  We will recheck labs today.  Plan: Urinalysis, Routine w reflex microscopic, Anti-DNA antibody, double-stranded, C3 and C4, Sedimentation rate  Sjogren's syndrome with keratoconjunctivitis sicca ([facility]) -over-the-counter products were discussed.  She has had positive rheumatoid factor in the past.  We will check the levels of rheumatoid factor.  Plan: Rheumatoid factor  High risk medication use - PLQ 200 mg 1 tablet by mouth BID Monday through Friday. PLQ Eye Exam: 07/01/2020 - Plan: CBC with Differential/Platelet, COMPLETE METABOLIC PANEL WITH GFR, Serum protein electrophoresis with reflex today and then every 5 months.  She is fully vaccinated against COVID-19.  She also  received a third dose.  She wants to hold off on the fourth dose.  Raynaud's disease without gangrene-keeping the core temperature warm was discussed.  Primary osteoarthritis of both hands-joint protection muscle strengthening was discussed.  She continues to have some discomfort in her hands due to underlying osteoarthritis.  Primary osteoarthritis of both knees-she has chronic knee pain.  Muscle strength exercises were discussed.  Primary osteoarthritis of both feet-she has overcrowding of the toes and dorsal spurring.  Proper  fitting shoes were discussed.  Age-related osteoporosis without current pathological fracture - 12/14/18 DEXA BMD 0.523 with T-score -2.9 1/3 right forearm.  She is prescribed Evista 60 mg 3 times weekly prescribed by her gynecologist.  History of hypertension-her blood pressure is normal today.  History of hypothyroidism  History of gastroesophageal reflux (GERD)/ PUD  History of MRSA infection  Orders: Orders Placed This Encounter  Procedures  . CBC with Differential/Platelet  . COMPLETE METABOLIC PANEL WITH GFR  . Urinalysis, Routine w reflex microscopic  . Anti-DNA antibody, double-stranded  . C3 and C4  . Sedimentation rate  . Rheumatoid factor  . Serum protein electrophoresis with reflex   No orders of the defined types were placed in this encounter.    Follow-Up Instructions: Return in about 5 months (around 03/18/2021) for Sjogren's, Osteoarthritis.   Bo Merino, MD  Note - This record has been created using Editor, commissioning.  Chart creation errors have been sought, but may not always  have been located. Such creation errors do not reflect on  the standard of medical care.

## 2020-10-16 ENCOUNTER — Encounter: Payer: Self-pay | Admitting: Rheumatology

## 2020-10-16 ENCOUNTER — Ambulatory Visit: Payer: PPO | Admitting: Rheumatology

## 2020-10-16 ENCOUNTER — Other Ambulatory Visit: Payer: Self-pay

## 2020-10-16 VITALS — BP 113/67 | HR 60 | Ht 65.5 in | Wt 143.8 lb

## 2020-10-16 DIAGNOSIS — M19041 Primary osteoarthritis, right hand: Secondary | ICD-10-CM | POA: Diagnosis not present

## 2020-10-16 DIAGNOSIS — M810 Age-related osteoporosis without current pathological fracture: Secondary | ICD-10-CM

## 2020-10-16 DIAGNOSIS — I7300 Raynaud's syndrome without gangrene: Secondary | ICD-10-CM | POA: Diagnosis not present

## 2020-10-16 DIAGNOSIS — Z8679 Personal history of other diseases of the circulatory system: Secondary | ICD-10-CM | POA: Diagnosis not present

## 2020-10-16 DIAGNOSIS — M19042 Primary osteoarthritis, left hand: Secondary | ICD-10-CM

## 2020-10-16 DIAGNOSIS — Z8614 Personal history of Methicillin resistant Staphylococcus aureus infection: Secondary | ICD-10-CM

## 2020-10-16 DIAGNOSIS — M170 Bilateral primary osteoarthritis of knee: Secondary | ICD-10-CM

## 2020-10-16 DIAGNOSIS — Z79899 Other long term (current) drug therapy: Secondary | ICD-10-CM | POA: Diagnosis not present

## 2020-10-16 DIAGNOSIS — M19071 Primary osteoarthritis, right ankle and foot: Secondary | ICD-10-CM | POA: Diagnosis not present

## 2020-10-16 DIAGNOSIS — M19072 Primary osteoarthritis, left ankle and foot: Secondary | ICD-10-CM

## 2020-10-16 DIAGNOSIS — Z8719 Personal history of other diseases of the digestive system: Secondary | ICD-10-CM | POA: Diagnosis not present

## 2020-10-16 DIAGNOSIS — Z8639 Personal history of other endocrine, nutritional and metabolic disease: Secondary | ICD-10-CM

## 2020-10-16 DIAGNOSIS — M359 Systemic involvement of connective tissue, unspecified: Secondary | ICD-10-CM | POA: Diagnosis not present

## 2020-10-16 DIAGNOSIS — M3501 Sicca syndrome with keratoconjunctivitis: Secondary | ICD-10-CM | POA: Diagnosis not present

## 2020-10-16 NOTE — Patient Instructions (Addendum)
Standing Labs We placed an order today for your standing lab work.   Please have your standing labs drawn in  August  If possible, please have your labs drawn 2 weeks prior to your appointment so that the provider can discuss your results at your appointment.  We have open lab daily Monday through Thursday from 1:30-4:30 PM and Friday from 1:30-4:00 PM at the office of Dr. Bo Merino, [facility].   Please be advised, all patients with office appointments requiring lab work will take precedents over walk-in lab work.  If possible, please come for your lab work on Monday and Friday afternoons, as you may experience shorter wait times. The office is located at [address] [place], [place], [place] [address] No appointment is necessary.   Labs are drawn by [facility]. Please bring your co-pay at the time of your lab draw.  You may receive a bill from [facility] for your lab work.  If you wish to have your labs drawn at another location, please call the office 24 hours in advance to send orders.  If you have any questions regarding directions or hours of operation,  please call [phone].   As a reminder, please drink plenty of water prior to coming for your lab work. Thanks!   Vaccines You are taking a medication(s) that can suppress your immune system.  The following immunizations are recommended: . Flu annually . Covid-19  . Pneumonia (Pneumovax 23 and Prevnar 13 spaced at least 1 year apart) . Shingrix (after age 63)  Please check with your PCP to make sure you are up to date.

## 2020-10-20 LAB — CBC WITH DIFFERENTIAL/PLATELET
Absolute Monocytes: 536 cells/uL (ref 200–950)
Basophils Absolute: 19 cells/uL (ref 0–200)
Basophils Relative: 0.5 %
Eosinophils Absolute: 141 cells/uL (ref 15–500)
Eosinophils Relative: 3.7 %
HCT: 41 % (ref 35.0–45.0)
Hemoglobin: 13.1 g/dL (ref 11.7–15.5)
Lymphs Abs: 1338 cells/uL (ref 850–3900)
MCH: 29.7 pg (ref 27.0–33.0)
MCHC: 32 g/dL (ref 32.0–36.0)
MCV: 93 fL (ref 80.0–100.0)
MPV: 11 fL (ref 7.5–12.5)
Monocytes Relative: 14.1 %
Neutro Abs: 1767 cells/uL (ref 1500–7800)
Neutrophils Relative %: 46.5 %
Platelets: 174 10*3/uL (ref 140–400)
RBC: 4.41 10*6/uL (ref 3.80–5.10)
RDW: 12.4 % (ref 11.0–15.0)
Total Lymphocyte: 35.2 %
WBC: 3.8 10*3/uL (ref 3.8–10.8)

## 2020-10-20 LAB — COMPLETE METABOLIC PANEL WITH GFR
AG Ratio: 1.1 (calc) (ref 1.0–2.5)
ALT: 13 U/L (ref 6–29)
AST: 29 U/L (ref 10–35)
Albumin: 3.6 g/dL (ref 3.6–5.1)
Alkaline phosphatase (APISO): 73 U/L (ref 37–153)
BUN: 19 mg/dL (ref 7–25)
CO2: 29 mmol/L (ref 20–32)
Calcium: 9.8 mg/dL (ref 8.6–10.4)
Chloride: 105 mmol/L (ref 98–110)
Creat: 0.8 mg/dL (ref 0.60–0.88)
GFR, Est African American: 81 mL/min/{1.73_m2} (ref >=60)
GFR, Est Non African American: 70 mL/min/{1.73_m2} (ref >=60)
Globulin: 3.2 g/dL (calc) (ref 1.9–3.7)
Glucose, Bld: 79 mg/dL (ref 65–99)
Potassium: 4.2 mmol/L (ref 3.5–5.3)
Sodium: 140 mmol/L (ref 135–146)
Total Bilirubin: 0.4 mg/dL (ref 0.2–1.2)
Total Protein: 6.8 g/dL (ref 6.1–8.1)

## 2020-10-20 LAB — URINALYSIS, ROUTINE W REFLEX MICROSCOPIC
Bacteria, UA: NONE SEEN /HPF
Bilirubin Urine: NEGATIVE
Glucose, UA: NEGATIVE
Hgb urine dipstick: NEGATIVE
Hyaline Cast: NONE SEEN /LPF
Ketones, ur: NEGATIVE
Nitrite: NEGATIVE
RBC / HPF: NONE SEEN /HPF (ref 0–2)
Specific Gravity, Urine: 1.016 (ref 1.001–1.03)
pH: 6 (ref 5.0–8.0)

## 2020-10-20 LAB — PROTEIN ELECTROPHORESIS, SERUM, WITH REFLEX
Albumin ELP: 3.6 g/dL — ABNORMAL LOW (ref 3.8–4.8)
Alpha 1: 0.3 g/dL (ref 0.2–0.3)
Alpha 2: 0.7 g/dL (ref 0.5–0.9)
Beta 2: 0.5 g/dL (ref 0.2–0.5)
Beta Globulin: 0.5 g/dL (ref 0.4–0.6)
Gamma Globulin: 1.5 g/dL (ref 0.8–1.7)
Total Protein: 7.1 g/dL (ref 6.1–8.1)

## 2020-10-20 LAB — ANTI-DNA ANTIBODY, DOUBLE-STRANDED: ds DNA Ab: 1 IU/mL

## 2020-10-20 LAB — C3 AND C4
C3 Complement: 121 mg/dL (ref 83–193)
C4 Complement: 23 mg/dL (ref 15–57)

## 2020-10-20 LAB — SEDIMENTATION RATE: Sed Rate: 22 mm/h (ref 0–30)

## 2020-10-20 LAB — RHEUMATOID FACTOR: Rheumatoid fact SerPl-aCnc: 16 IU/mL — ABNORMAL HIGH (ref <14)

## 2020-10-20 NOTE — Progress Notes (Signed)
CBC and CMP WNL.  ESR WNL.  dsDNA is negative.  Complements WNL.  RF is borderline positive.  SPEP did not reveal any abnormal protein bands.  UA trace protein and moderate calcium oxalate crystals.  Please clarify if the patient has a history of kidney stones.  Please forward lab work to PCP.

## 2020-10-28 DIAGNOSIS — H903 Sensorineural hearing loss, bilateral: Secondary | ICD-10-CM | POA: Diagnosis not present

## 2020-11-21 ENCOUNTER — Other Ambulatory Visit: Payer: Self-pay | Admitting: Cardiology

## 2020-11-24 DIAGNOSIS — N390 Urinary tract infection, site not specified: Secondary | ICD-10-CM | POA: Diagnosis not present

## 2020-11-25 ENCOUNTER — Other Ambulatory Visit: Payer: Self-pay | Admitting: Cardiology

## 2020-11-25 DIAGNOSIS — H2512 Age-related nuclear cataract, left eye: Secondary | ICD-10-CM | POA: Diagnosis not present

## 2020-11-25 DIAGNOSIS — H353132 Nonexudative age-related macular degeneration, bilateral, intermediate dry stage: Secondary | ICD-10-CM | POA: Diagnosis not present

## 2020-11-25 DIAGNOSIS — H04123 Dry eye syndrome of bilateral lacrimal glands: Secondary | ICD-10-CM | POA: Diagnosis not present

## 2020-11-25 DIAGNOSIS — H524 Presbyopia: Secondary | ICD-10-CM | POA: Diagnosis not present

## 2020-11-28 DIAGNOSIS — E785 Hyperlipidemia, unspecified: Secondary | ICD-10-CM | POA: Diagnosis not present

## 2020-11-28 DIAGNOSIS — M810 Age-related osteoporosis without current pathological fracture: Secondary | ICD-10-CM | POA: Diagnosis not present

## 2020-11-28 DIAGNOSIS — F418 Other specified anxiety disorders: Secondary | ICD-10-CM | POA: Diagnosis not present

## 2020-11-28 DIAGNOSIS — I1310 Hypertensive heart and chronic kidney disease without heart failure, with stage 1 through stage 4 chronic kidney disease, or unspecified chronic kidney disease: Secondary | ICD-10-CM | POA: Diagnosis not present

## 2020-11-28 DIAGNOSIS — D72819 Decreased white blood cell count, unspecified: Secondary | ICD-10-CM | POA: Diagnosis not present

## 2020-11-28 DIAGNOSIS — N1831 Chronic kidney disease, stage 3a: Secondary | ICD-10-CM | POA: Diagnosis not present

## 2020-11-28 DIAGNOSIS — D899 Disorder involving the immune mechanism, unspecified: Secondary | ICD-10-CM | POA: Diagnosis not present

## 2020-11-28 DIAGNOSIS — I480 Paroxysmal atrial fibrillation: Secondary | ICD-10-CM | POA: Diagnosis not present

## 2020-11-28 DIAGNOSIS — E039 Hypothyroidism, unspecified: Secondary | ICD-10-CM | POA: Diagnosis not present

## 2020-11-28 DIAGNOSIS — M3210 Systemic lupus erythematosus, organ or system involvement unspecified: Secondary | ICD-10-CM | POA: Diagnosis not present

## 2020-11-28 DIAGNOSIS — M797 Fibromyalgia: Secondary | ICD-10-CM | POA: Diagnosis not present

## 2020-11-28 DIAGNOSIS — I2510 Atherosclerotic heart disease of native coronary artery without angina pectoris: Secondary | ICD-10-CM | POA: Diagnosis not present

## 2020-12-15 DIAGNOSIS — M816 Localized osteoporosis [Lequesne]: Secondary | ICD-10-CM | POA: Diagnosis not present

## 2020-12-15 DIAGNOSIS — N958 Other specified menopausal and perimenopausal disorders: Secondary | ICD-10-CM | POA: Diagnosis not present

## 2020-12-19 ENCOUNTER — Other Ambulatory Visit: Payer: Self-pay

## 2020-12-19 MED ORDER — OMEPRAZOLE 20 MG PO CPDR
20.0000 mg | DELAYED_RELEASE_CAPSULE | Freq: Every day | ORAL | 1 refills | Status: DC
Start: 2020-12-19 — End: 2021-04-16

## 2020-12-23 ENCOUNTER — Other Ambulatory Visit: Payer: Self-pay | Admitting: Cardiology

## 2020-12-23 ENCOUNTER — Other Ambulatory Visit: Payer: Self-pay | Admitting: Physician Assistant

## 2020-12-23 DIAGNOSIS — Z79899 Other long term (current) drug therapy: Secondary | ICD-10-CM

## 2020-12-23 DIAGNOSIS — M359 Systemic involvement of connective tissue, unspecified: Secondary | ICD-10-CM

## 2020-12-23 NOTE — Telephone Encounter (Signed)
Last Visit: 10/16/2020 Next Visit: 03/19/2021 Labs: 10/16/2020 CBC and CMP WNL Eye exam: 07/01/2020 normal   Current Dose per office note 10/16/2020: PLQ 200 mg 1 tablet by mouth BID Monday through Friday DX: Autoimmune disease   Last Fill: 09/22/2020  Okay to refill per Dr. Estanislado Pandy

## 2020-12-24 ENCOUNTER — Other Ambulatory Visit: Payer: Self-pay | Admitting: Rheumatology

## 2020-12-24 DIAGNOSIS — Z79899 Other long term (current) drug therapy: Secondary | ICD-10-CM

## 2020-12-24 DIAGNOSIS — M359 Systemic involvement of connective tissue, unspecified: Secondary | ICD-10-CM

## 2020-12-30 ENCOUNTER — Encounter: Payer: Self-pay | Admitting: Rheumatology

## 2020-12-30 DIAGNOSIS — H31091 Other chorioretinal scars, right eye: Secondary | ICD-10-CM | POA: Diagnosis not present

## 2020-12-30 DIAGNOSIS — H353131 Nonexudative age-related macular degeneration, bilateral, early dry stage: Secondary | ICD-10-CM | POA: Diagnosis not present

## 2020-12-30 DIAGNOSIS — H35433 Paving stone degeneration of retina, bilateral: Secondary | ICD-10-CM | POA: Diagnosis not present

## 2020-12-30 DIAGNOSIS — H35373 Puckering of macula, bilateral: Secondary | ICD-10-CM | POA: Diagnosis not present

## 2021-01-13 ENCOUNTER — Encounter: Payer: Self-pay | Admitting: Physician Assistant

## 2021-01-13 NOTE — Progress Notes (Signed)
Cardiology Office Note    Date:  01/14/2021   ID:  Syra Sirmons, DOB [birth year], MRN [id number]  PCP:  Shon Baton, MD  Cardiologist:  Candee Furbish, MD  Electrophysiologist:  None   Chief Complaint: f/u PAF, CAD   History of Present Illness:   Alice Medina is a 80 y.o. female with history of PAF, CAD (myocardial bridging), autoimmune disease followed by rheumatology, HTN, HLD, breast CA, anxiety, dilation of esophageal narrowing who presents for routine follow-up. She is a former patient of Dr. Thurman Coyer then established with Dr. Marlou Porch. Prior notes outline history of CAD with myocardial bridging by cath in 1990 (normal Left main, normal CFX, normal RCA, myocardial bridge in LAD per Dr. Thurman Coyer note). She has been maintained on sotalol for her atrial fibrillation and saw the afib clinic in 03/2020 as well. 2d echo 03/2020 EF 65-70%, grade 1 DD, normal PA pressure.  Historically she has declined anticoagulation in favor of aspirin.  She is seen back for follow-up overall doing well and feels stable from a cardiac standpoint. She enjoys "piddling" in the garden and feels better when she's generally active.  She still gets occasional episodes of palpitations that lasts anywhere from 30 minutes up to several hours.  Usually she can stop what she is doing and if she is able to go to sleep, the palpitations resolve.  There is no specific pattern to these and sometimes can go months in between episodes.  She does not have any chest pain, dyspnea, dizziness, presyncope or syncope.  No recent falls.  We discussed anticoagulation further.  She states her sister has been on Eliquis and has done fine and she is interested in considering this.  She denies any issues with bleeding.  Labwork independently reviewed: 09/2020 CMET wnl, CBC wnl 04/2020 scanned LDL 70, trig 54 (PCP)   Past Medical History:  Diagnosis Date   ADENOCARCINOMA, BREAST 05/02/2009   Qualifier: Diagnosis of  By: Deatra Ina MD,  Sandy Salaam    Anxiety    Arthritis    Autoimmune disorder ([place])    SJORGREN'S   Breast cancer ([place])    right breast   Chronic headaches    CORONARY ARTERY DISEASE    cath in 1990 (normal Left main, normal CFX, normal RCA, myocardial bridge in LAD per Dr. Thurman Coyer note)   Depression    Diarrhea 05/28/2013   DYSPHAGIA UNSPECIFIED 05/02/2009   Qualifier: Diagnosis of  By: Deatra Ina MD, Sandy Salaam    Family history of malignant neoplasm of gastrointestinal tract 06/22/2011   Brother with colon cancer    GERD (gastroesophageal reflux disease)    Hemorrhage of rectum and anus 06/22/2011   History of hypothyroidism 01/13/2017   History of MRSA infection 01/13/2017   Hypertension    Multiple closed fractures of metacarpal bone 07/25/2017   Myocardial bridge    Myocardial infarction [facility])    1990   Neuromuscular disorder ([place])    LUPUS   Osteopenia of multiple sites 01/13/2017   Osteoporosis    PAC (premature atrial contraction) 04/07/2020   PAF (paroxysmal atrial fibrillation) (H[facility])    Photosensitivity 01/13/2017   Primary osteoarthritis of both hands 01/13/2017   Raynaud's disease without gangrene 01/13/2017   Recurrent oral ulcers and nasal ulcers  01/13/2017   Sjogren's syndrome with keratoconjunctivitis sicca ([place]) 01/13/2017   Status post dilation of esophageal narrowing    Thyroid disease    Ulcer     Past Surgical History:  Procedure Laterality Date  APPENDECTOMY     breast reconstuction     x 2   CHOLECYSTECTOMY     COLONOSCOPY     double mastectomy     PARTIAL HYSTERECTOMY  1990   TIBIA FRACTURE SURGERY Right    with subsequent hardware removal   UPPER GASTROINTESTINAL ENDOSCOPY     WRIST FRACTURE SURGERY Left     Current Medications: Current Meds  Medication Sig   amLODipine (NORVASC) 2.5 MG tablet Take 1 tablet (2.5 mg total) by mouth daily. Please schedule appointment for future refills.   aspirin 325 MG tablet Take 325 mg by mouth daily.    Budesonide-Formoterol Fumarate (SYMBICORT IN) Inhale into the lungs as needed.    cholecalciferol (VITAMIN D) 1000 UNITS tablet Take 1,000 Units by mouth daily.   cyclobenzaprine (FLEXERIL) 10 MG tablet Take 10 mg by mouth at bedtime as needed for muscle spasms.   cycloSPORINE (RESTASIS) 0.05 % ophthalmic emulsion Place 1 drop into both eyes 2 (two) times daily.   diclofenac Sodium (VOLTAREN) 1 % GEL Apply 2-4 grams to affected joint 4 times daily as needed.   DULoxetine (CYMBALTA) 60 MG capsule Take 60 mg by mouth daily.   hydroxychloroquine (PLAQUENIL) 200 MG tablet TAKE 1 TABLET BY MOUTH TWICE DAILY MONDAY THROUGH FRIDAY ONLY.   levothyroxine (SYNTHROID, LEVOTHROID) 50 MCG tablet Take 50 mcg by mouth daily.   LORazepam (ATIVAN) 2 MG tablet Take 2 mg by mouth at bedtime.   losartan (COZAAR) 100 MG tablet TAKE 1 TABLET(100 MG) BY MOUTH DAILY   Multiple Vitamins-Minerals (MULTIVITAMIN WITH MINERALS) tablet Take 1 tablet by mouth daily.   omeprazole (PRILOSEC) 20 MG capsule Take 1 capsule (20 mg total) by mouth daily.   raloxifene (EVISTA) 60 MG tablet Take 60 mg by mouth daily. three times a week   sotalol (BETAPACE) 80 MG tablet TAKE 1 TABLET(80 MG) BY MOUTH TWICE DAILY. Please keep upcoming appt in June 2022 before anymore refills. Thank you      Allergies:   Codeine, Robinul [glycopyrrolate], and Sulfamethoxazole-trimethoprim   Social History   Socioeconomic History   Marital status: Widowed    Spouse name: Not on file   Number of children: 2   Years of education: Not on file   Highest education level: Not on file  Occupational History   Occupation: retired Corporate treasurer  Tobacco Use   Smoking status: Never   Smokeless tobacco: Never  Vaping Use   Vaping Use: Never used  Substance and Sexual Activity   Alcohol use: Not Currently   Drug use: Never   Sexual activity: Not on file  Other Topics Concern   Not on file  Social History Narrative   1 cup of coffee daily   Social  Determinants of Health   Financial Resource Strain: Not on file  Food Insecurity: Not on file  Transportation Needs: Not on file  Physical Activity: Not on file  Stress: Not on file  Social Connections: Not on file     Family History:  The patient's family history includes Autoimmune disease in her daughter; Breast cancer in her maternal grandmother, mother, and sister; Cancer in her father, sister, and son; Colon cancer in her brother; Diabetes in her son; Heart disease in her brother; Heart failure in her father; Lung cancer in her mother; Pseudochol deficiency in an other family member; Rheum arthritis in her grandchild. There is no history of Esophageal cancer, Rectal cancer, or Stomach cancer.  ROS:   Please see the history of  present illness. Mild edema reported All other systems are reviewed and otherwise negative.    EKGs/Labs/Other Studies Reviewed:    Studies reviewed are outlined and summarized above. Reports included below if pertinent.  2d echo 03/2020    1. Left ventricular ejection fraction, by estimation, is 65 to 70%. The  left ventricle has normal function. The left ventricle has no regional  wall motion abnormalities. There is moderate concentric left ventricular  hypertrophy. Left ventricular  diastolic parameters are consistent with Grade I diastolic dysfunction  (impaired relaxation).   2. Right ventricular systolic function is normal. The right ventricular  size is normal. There is normal pulmonary artery systolic pressure. The  estimated right ventricular systolic pressure is 81.4 mmHg.   3. The mitral valve is normal in structure. No evidence of mitral valve  regurgitation. No evidence of mitral stenosis.   4. The aortic valve is normal in structure. Aortic valve regurgitation is  mild. No aortic stenosis is present.   5. The inferior vena cava is normal in size with greater than 50%  respiratory variability, suggesting right atrial pressure of 3 mmHg.    Scanned Lexiscan Nuc 2013 Normal    EKG:  EKG is ordered today, personally reviewed, demonstrating SB 57bpm, nonspecific STTW changes with "swooped" appearance in I, II, avF, V4-V6 similar to prior, QTc 424ms  Recent Labs: 10/16/2020: ALT 13; BUN 19; Creat 0.80; Hemoglobin 13.1; Platelets 174; Potassium 4.2; Sodium 140  Recent Lipid Panel No results found for: CHOL, TRIG, HDL, CHOLHDL, VLDL, LDLCALC, LDLDIRECT  PHYSICAL EXAM:    VS:  BP 120/70 (BP Location: Left Arm, Patient Position: Sitting, Cuff Size: Normal)   Pulse (!) 57   Ht 5' 5.5" (1.664 m)   Wt 139 lb (63 kg)   SpO2 94%   BMI 22.78 kg/m   BMI: Body mass index is 22.78 kg/m.  GEN: Well nourished, well developed female in no acute distress HEENT: normocephalic, atraumatic Neck: no JVD, carotid bruits, or masses Cardiac: RRR; no murmurs, rubs, or gallops, no significant edema, varicose veins noted Respiratory:  clear to auscultation bilaterally, normal work of breathing GI: soft, nontender, nondistended, + BS MS: no deformity or atrophy, normal gait Skin: warm and dry, no rash Neuro:  Alert and Oriented x 3, Strength and sensation are intact, follows commands Psych: euthymic mood, full affect  Wt Readings from Last 3 Encounters:  01/14/21 139 lb (63 kg)  10/16/20 143 lb 12.8 oz (65.2 kg)  08/21/20 143 lb (64.9 kg)     ASSESSMENT & PLAN:   Paroxysmal atrial fibrillation - maintaining sinus rhythm by EKG today. She does occasional experience breakthrough palpitations but based on the frequency, she feels that she has been largely well controlled on the current dose of sotalol.  Her CHA2DS2-VASc score is 4-5, for hypertension, age, female, and possible inclusion of her coronary artery disease although this is not historically an atherosclerotic process.  We discussed the rationale for anticoagulation in the setting of paroxysmal atrial fibrillation as it relates to stroke prevention.  Historically she has continued  with aspirin because she has tolerated it well.  She has autoimmune disease but does not feel that the aspirin is something that she absolutely needs for pain management. Her sister has been on Eliquis and done well so it is something she has been thinking about. Per our shared decision making, we will stop aspirin and  start Eliquis 5 mg twice daily (age >80, Cr < 1.5, weight >60kg). Samples given  to start. If Eliquis proves costly, OK to switch to Xarelto. We will also obtain an updated baseline CBC, BMET, Mg and updated TSH today.  We will plan to repeat CBC and BMET in 1 month at which time we will check in with her on how things are going.  Bleeding precautions were reviewed.  Coronary artery disease by way of intramyocardial bridge - she has not had any symptoms related to this.  Continue clinical surveillance.  Essential HTN - controlled on present regimen.  She does report some mild edema at times in her legs which is not significant at this time.  She has varicose veins on exam which may be contributing.  She is on amlodipine which can cause some vasodilation and swelling, although she is also on this for her rainouts disease as well, therefore would continue.  We discussed elevation, use of compression hose, and limiting sodium in the diet.  Hyperlipidemia - lipids are followed by primary care with last assessment as reviewed above in 2021.  Disposition: F/u with Afib clinic in 6 months and 1 year with Dr. Marlou Porch.  Medication Adjustments/Labs and Tests Ordered: Current medicines are reviewed at length with the patient today.  Concerns regarding medicines are outlined above. Medication changes, Labs and Tests ordered today are summarized above and listed in the Patient Instructions accessible in Encounters.   Signed, Charlie Pitter, PA-C  01/14/2021 9:47 AM    [address], [place], [place]  [id number] Phone: [phone]; Fax: [phone]

## 2021-01-14 ENCOUNTER — Other Ambulatory Visit: Payer: Self-pay

## 2021-01-14 ENCOUNTER — Ambulatory Visit: Payer: PPO | Admitting: Physician Assistant

## 2021-01-14 ENCOUNTER — Encounter: Payer: Self-pay | Admitting: Physician Assistant

## 2021-01-14 VITALS — BP 120/70 | HR 57 | Ht 65.5 in | Wt 139.0 lb

## 2021-01-14 DIAGNOSIS — E785 Hyperlipidemia, unspecified: Secondary | ICD-10-CM | POA: Diagnosis not present

## 2021-01-14 DIAGNOSIS — I480 Paroxysmal atrial fibrillation: Secondary | ICD-10-CM

## 2021-01-14 DIAGNOSIS — I10 Essential (primary) hypertension: Secondary | ICD-10-CM | POA: Diagnosis not present

## 2021-01-14 DIAGNOSIS — Q245 Malformation of coronary vessels: Secondary | ICD-10-CM | POA: Diagnosis not present

## 2021-01-14 LAB — BASIC METABOLIC PANEL
BUN/Creatinine Ratio: 25 (ref 12–28)
BUN: 21 mg/dL (ref 8–27)
CO2: 24 mmol/L (ref 20–29)
Calcium: 10.3 mg/dL (ref 8.7–10.3)
Chloride: 102 mmol/L (ref 96–106)
Creatinine, Ser: 0.83 mg/dL (ref 0.57–1.00)
Glucose: 86 mg/dL (ref 65–99)
Potassium: 4.2 mmol/L (ref 3.5–5.2)
Sodium: 140 mmol/L (ref 134–144)
eGFR: 71 mL/min/{1.73_m2} (ref >59)

## 2021-01-14 LAB — CBC
Hematocrit: 38.7 % (ref 34.0–46.6)
Hemoglobin: 13.1 g/dL (ref 11.1–15.9)
MCH: 30 pg (ref 26.6–33.0)
MCHC: 33.9 g/dL (ref 31.5–35.7)
MCV: 89 fL (ref 79–97)
Platelets: 163 10*3/uL (ref 150–450)
RBC: 4.36 x10E6/uL (ref 3.77–5.28)
RDW: 12.1 % (ref 11.7–15.4)
WBC: 3.4 10*3/uL (ref 3.4–10.8)

## 2021-01-14 LAB — MAGNESIUM: Magnesium: 1.9 mg/dL (ref 1.6–2.3)

## 2021-01-14 LAB — TSH: TSH: 1.81 u[IU]/mL (ref 0.450–4.500)

## 2021-01-14 MED ORDER — APIXABAN 5 MG PO TABS: 5.0000 mg | ORAL_TABLET | Freq: Two times a day (BID) | ORAL | 11 refills | Status: DC

## 2021-01-14 NOTE — Patient Instructions (Signed)
Medication Instructions:  Your physician has recommended you make the following change in your medication:   STOP Aspirin  START Eliquis 5 mg taking 1 tablet twice a day  *If you need a refill on your cardiac medications before your next appointment, please call your pharmacy*   Lab Work: TODAY:  BMET, CBC, & TSH  1 MONTH:  BMET & CBC  If you have labs (blood work) drawn today and your tests are completely normal, you will receive your results only by: [place] (if you have MyChart) OR A paper copy in the mail If you have any lab test that is abnormal or we need to change your treatment, we will call you to review the results.   Testing/Procedures: None ordered   Follow-Up: At [facility], you and your health needs are our priority.  As part of our continuing mission to provide you with exceptional heart care, we have created designated Provider Care Teams.  These Care Teams include your primary Cardiologist (physician) and Advanced Practice Providers (APPs -  Physician Assistants and Nurse Practitioners) who all work together to provide you with the care you need, when you need it.  We recommend signing up for the patient portal called "MyChart".  Sign up information is provided on this After Visit Summary.  MyChart is used to connect with patients for Virtual Visits (Telemedicine).  Patients are able to view lab/test results, encounter notes, upcoming appointments, etc.  Non-urgent messages can be sent to your provider as well.   To learn more about what you can do with MyChart, go to [email or web address].    Your next appointment:   6 MONTHS IN THE AFIB CLINIC   1 YEAR WITH DR. Marlou Porch  The format for your next appointment:   In Person        Other Instructions  If you notice any bleeding such as blood in stool, black tarry stools, blood in urine, nosebleeds or any other unusual bleeding, call your doctor immediately. It is not normal to have this kind of  bleeding while on a blood thinner and usually indicates there is an underlying problem with one of your body systems that needs to be checked out.

## 2021-01-15 ENCOUNTER — Telehealth: Payer: Self-pay

## 2021-01-15 DIAGNOSIS — R790 Abnormal level of blood mineral: Secondary | ICD-10-CM

## 2021-01-15 MED ORDER — MAGNESIUM OXIDE 400 MG PO TABS: 400.0000 mg | ORAL_TABLET | Freq: Every day | ORAL | 3 refills | Status: DC

## 2021-01-15 NOTE — Telephone Encounter (Signed)
Called patient with results. Placed orders for labs and medication. Patient verbalized understanding.

## 2021-01-15 NOTE — Telephone Encounter (Signed)
-----   Message from Charlie Pitter, [place] sent at 01/15/2021  7:52 AM EDT ----- Please let patient know labs look great. Her magnesium level is just slightly below goal of 2.0. Would add MagOx 400mg  daily and add Magnesium to the labs we are rechecking in a month. Please increase dietary intake of healthy sources of magnesium including leafy greens, nuts, seeds, fish, beans, whole grains, avocados, yogurt, and bananas. Otherwise continue plan as discussed

## 2021-01-28 ENCOUNTER — Other Ambulatory Visit: Payer: Self-pay | Admitting: Cardiology

## 2021-02-13 ENCOUNTER — Other Ambulatory Visit: Payer: Self-pay

## 2021-02-13 ENCOUNTER — Other Ambulatory Visit: Payer: PPO | Admitting: *Deleted

## 2021-02-13 DIAGNOSIS — I480 Paroxysmal atrial fibrillation: Secondary | ICD-10-CM | POA: Diagnosis not present

## 2021-02-13 DIAGNOSIS — I10 Essential (primary) hypertension: Secondary | ICD-10-CM

## 2021-02-13 DIAGNOSIS — E785 Hyperlipidemia, unspecified: Secondary | ICD-10-CM | POA: Diagnosis not present

## 2021-02-13 DIAGNOSIS — Q245 Malformation of coronary vessels: Secondary | ICD-10-CM | POA: Diagnosis not present

## 2021-02-13 DIAGNOSIS — R790 Abnormal level of blood mineral: Secondary | ICD-10-CM | POA: Diagnosis not present

## 2021-02-13 LAB — CBC
Hematocrit: 41.3 % (ref 34.0–46.6)
Hemoglobin: 13.1 g/dL (ref 11.1–15.9)
MCH: 29.4 pg (ref 26.6–33.0)
MCHC: 31.7 g/dL (ref 31.5–35.7)
MCV: 93 fL (ref 79–97)
Platelets: 176 10*3/uL (ref 150–450)
RBC: 4.45 x10E6/uL (ref 3.77–5.28)
RDW: 12.2 % (ref 11.7–15.4)
WBC: 4.2 10*3/uL (ref 3.4–10.8)

## 2021-02-13 LAB — MAGNESIUM: Magnesium: 1.9 mg/dL (ref 1.6–2.3)

## 2021-02-16 ENCOUNTER — Telehealth: Payer: Self-pay | Admitting: Physician Assistant

## 2021-02-16 DIAGNOSIS — Z79899 Other long term (current) drug therapy: Secondary | ICD-10-CM

## 2021-02-16 NOTE — Telephone Encounter (Signed)
Returned the call back to pt.  She just wanted to inform Melina Copa, PA-C, that she is also on Omeprazole and that she was reading the information sheet and it said it could cause low Magnesium.  I did advise the pt that is why Dayna was monitoring her Magnesium levels and to increase it to twice a day.  She was very appreciative for the call back and said she just wanted [name] to know.  She is aware I will send to Melina Copa, PA-C to make her aware.

## 2021-02-16 NOTE — Telephone Encounter (Signed)
Follow Up:      Patient said she talked to Jeanann Lewandowsky earlier today. She says she need to talk to her again, she have somr information for her.He s

## 2021-02-17 NOTE — Telephone Encounter (Signed)
Noted, agree. She may talk to the prescriber of her omeprazole to find out the long term plan for this medicine - if she ever goes off this, she should let us know and we can adjust/follow magnesium levels. Would go ahead and repeat her level in about 2 weeks on the higher dose. Please increase dietary intake of healthy sources of magnesium including leafy greens, nuts, seeds, fish, beans, whole grains, avocados, yogurt, and bananas.  Kealey Kemmer PA-C

## 2021-02-18 NOTE — Addendum Note (Signed)
Addended by: Gaetano Net on: 02/18/2021 08:29 AM   Modules accepted: Orders

## 2021-02-18 NOTE — Telephone Encounter (Signed)
Pt has been made aware to talk to the prescriber of her omeprazole to find out the long term plan for this medicine - if she ever goes off this, she should let us know and we can adjust/follow magnesium levels.  She will come in 2 weeks for repeat Magnesium level.

## 2021-03-02 ENCOUNTER — Other Ambulatory Visit: Payer: Self-pay

## 2021-03-02 ENCOUNTER — Other Ambulatory Visit: Payer: PPO | Admitting: *Deleted

## 2021-03-02 DIAGNOSIS — Z79899 Other long term (current) drug therapy: Secondary | ICD-10-CM

## 2021-03-02 LAB — MAGNESIUM: Magnesium: 2 mg/dL (ref 1.6–2.3)

## 2021-03-03 ENCOUNTER — Telehealth: Payer: Self-pay | Admitting: *Deleted

## 2021-03-03 MED ORDER — MAGNESIUM OXIDE 400 MG PO TABS: 400.0000 mg | ORAL_TABLET | Freq: Two times a day (BID) | ORAL | 11 refills | Status: DC

## 2021-03-03 NOTE — Progress Notes (Signed)
Pt has been made aware of normal result and verbalized understanding.  jw

## 2021-03-03 NOTE — Telephone Encounter (Signed)
-----   Message from Charlie Pitter, [place] sent at 03/03/2021  7:57 AM EDT ----- Please let patient know recheck magnesium level is perfect, would continue current magnesium supplement.

## 2021-03-06 ENCOUNTER — Other Ambulatory Visit: Payer: Self-pay | Admitting: *Deleted

## 2021-03-06 DIAGNOSIS — M359 Systemic involvement of connective tissue, unspecified: Secondary | ICD-10-CM

## 2021-03-06 DIAGNOSIS — I7300 Raynaud's syndrome without gangrene: Secondary | ICD-10-CM | POA: Diagnosis not present

## 2021-03-06 DIAGNOSIS — M3501 Sicca syndrome with keratoconjunctivitis: Secondary | ICD-10-CM

## 2021-03-06 DIAGNOSIS — Z79899 Other long term (current) drug therapy: Secondary | ICD-10-CM | POA: Diagnosis not present

## 2021-03-06 NOTE — Progress Notes (Signed)
 Office Visit Note  Patient: Alice Medina             Date of Birth: 02/17/1941           MRN: [id number]             PCP: Russo, John, MD Referring: Russo, John, MD Visit Date: 03/19/2021 Occupation: @GUAROCC@  Subjective:  Medication monitoring   History of Present Illness: Alice Medina is a 79 y.o. female with history of autoimmune disease, Sjogren's syndrome, and osteoarthritis.  She is taking Plaquenil 200 mg 1 tablet by mouth twice daily Monday through Friday.  She has not missed any doses of PLQ.  She denies any signs or symptoms of a flare.  She continues to have chronic sicca symptoms.  She uses restasis for dry eye symptoms.  She is seeing her ophthalmologist every 6 months.  She uses Biotene products on occasion and tries to keep her water intake up throughout the day.  She continues to see the dentist every 6 months.  She denies any oral or nasal ulcerations recently.  She had a recent upper respiratory tract infection but her symptoms have completely resolved.  She denies any recent facial rash or skin lesions.  She tries to wear UV protective clothing if she knows she will be working in the yard.  She has been working in her yard on a regular basis which she feels has exacerbated some of her right-sided lower back pain.  She is not having any symptoms of radiculopathy at this time.  Last night she took Flexeril 5 mg half tablet at bedtime which alleviated her discomfort.  She had some stiffness in her lower back first thing this morning.  She continues to have occasional pain in both knee joints especially her left knee.  She denies any joint swelling at this time.  She has ongoing pain and stiffness in her right index finger but does not feel as though her symptoms have been worsening.  She denies any symptoms of raynaud's recently.  She takes amlodipine 2.5 mg daily.  She denies any recent falls or fractures. She remains on evista 60 mg three days a week for management of  osteoporosis.  She is taking vitamin D 1,000 units daily.  She had an updated DEXA earlier this spring.     Activities of Daily Living:  Patient reports morning stiffness for 30 minutes.   Patient Denies nocturnal pain.  Difficulty dressing/grooming: Denies Difficulty climbing stairs: Reports Difficulty getting out of chair: Reports Difficulty using hands for taps, buttons, cutlery, and/or writing: Reports  Review of Systems  Constitutional:  Positive for fatigue.  HENT:  Positive for mouth dryness. Negative for mouth sores and nose dryness.   Eyes:  Positive for dryness. Negative for pain and visual disturbance.  Respiratory:  Positive for shortness of breath. Negative for cough, hemoptysis and difficulty breathing.   Cardiovascular:  Positive for swelling in legs/feet. Negative for chest pain, palpitations and hypertension.  Gastrointestinal:  Negative for blood in stool, constipation and diarrhea.  Endocrine: Positive for increased urination.  Genitourinary:  Negative for difficulty urinating and painful urination.  Musculoskeletal:  Positive for joint pain, gait problem, joint pain, joint swelling, muscle weakness, morning stiffness and muscle tenderness. Negative for myalgias and myalgias.  Skin:  Negative for color change, pallor, rash, hair loss, nodules/bumps, skin tightness, ulcers and sensitivity to sunlight.  Allergic/Immunologic: Negative for susceptible to infections.  Neurological:  Negative for dizziness and headaches.  Hematological:  Negative   for bruising/bleeding tendency and swollen glands.  Psychiatric/Behavioral:  Positive for sleep disturbance. Negative for depressed mood. The patient is not nervous/anxious.    PMFS History:  Patient Active Problem List   Diagnosis Date Noted   Paroxysmal atrial fibrillation ([place]) 04/07/2020   Secondary hypercoagulable state ([place]) 04/07/2020   Multiple closed fractures of metacarpal bone 07/25/2017   Autoimmune disease ([place])  +ANA +Ro +La +RF oral ulcers nasal ulcers Raynaud photosensitivity SICCA  01/13/2017   High risk medication use 01/13/2017   Photosensitivity 01/13/2017   Sjogren's syndrome with keratoconjunctivitis sicca ([place]) 01/13/2017   Recurrent oral ulcers and nasal ulcers  01/13/2017   Raynaud's disease without gangrene 01/13/2017   Primary osteoarthritis of both hands 01/13/2017   Primary osteoarthritis of both knees 01/13/2017   Primary osteoarthritis of both feet 01/13/2017   Osteopenia of multiple sites 01/13/2017   History of hypertension 01/13/2017   History of hypothyroidism 01/13/2017   History of gastroesophageal reflux (GERD)/ PUD 01/13/2017   History of MRSA infection 01/13/2017   Diarrhea 05/28/2013   Family history of malignant neoplasm of gastrointestinal tract 06/22/2011   ADENOCARCINOMA, BREAST 05/02/2009   CORONARY ARTERY DISEASE 05/02/2009   DYSPHAGIA UNSPECIFIED 05/02/2009    Past Medical History:  Diagnosis Date   ADENOCARCINOMA, BREAST 05/02/2009   Qualifier: Diagnosis of  By: Deatra Ina MD, Sandy Salaam    Anxiety    Arthritis    Autoimmune disorder ([place])    SJORGREN'S   Breast cancer ([place])    right breast   Chronic headaches    CORONARY ARTERY DISEASE    cath in 1990 (normal Left main, normal CFX, normal RCA, myocardial bridge in LAD per Dr. Thurman Coyer note)   Depression    Diarrhea 05/28/2013   DYSPHAGIA UNSPECIFIED 05/02/2009   Qualifier: Diagnosis of  By: Deatra Ina MD, Sandy Salaam    Family history of malignant neoplasm of gastrointestinal tract 06/22/2011   Brother with colon cancer    GERD (gastroesophageal reflux disease)    Hemorrhage of rectum and anus 06/22/2011   History of hypothyroidism 01/13/2017   History of MRSA infection 01/13/2017   Hypertension    Multiple closed fractures of metacarpal bone 07/25/2017   Myocardial bridge    Myocardial infarction [facility])    1990   Neuromuscular disorder ([place])    LUPUS   Osteopenia of multiple sites 01/13/2017    Osteoporosis    PAC (premature atrial contraction) 04/07/2020   PAF (paroxysmal atrial fibrillation) ([facility])    Photosensitivity 01/13/2017   Primary osteoarthritis of both hands 01/13/2017   Raynaud's disease without gangrene 01/13/2017   Recurrent oral ulcers and nasal ulcers  01/13/2017   Sjogren's syndrome with keratoconjunctivitis sicca ([place]) 01/13/2017   Status post dilation of esophageal narrowing    Thyroid disease    Ulcer     Family History  Problem Relation Age of Onset   Breast cancer Mother    Lung cancer Mother    Cancer Father        larynx   Heart failure Father    Heart disease Brother    Colon cancer Brother    Pseudochol deficiency Other    Breast cancer Sister    Cancer Sister        thyroid    Breast cancer Maternal Grandmother    Autoimmune disease Daughter    Cancer Son        prostate    Diabetes Son    Rheum arthritis Grandchild    Esophageal cancer Neg  Hx    Rectal cancer Neg Hx    Stomach cancer Neg Hx    Past Surgical History:  Procedure Laterality Date   APPENDECTOMY     breast reconstuction     x 2   CHOLECYSTECTOMY     COLONOSCOPY     double mastectomy     PARTIAL HYSTERECTOMY  1990   TIBIA FRACTURE SURGERY Right    with subsequent hardware removal   UPPER GASTROINTESTINAL ENDOSCOPY     WRIST FRACTURE SURGERY Left    Social History   Social History Narrative   1 cup of coffee daily   Immunization History  Administered Date(s) Administered   PFIZER(Purple Top)SARS-COV-2 Vaccination 09/03/2019, 09/28/2019, 06/27/2020   Zoster Recombinat (Shingrix) 01/10/2018, 05/11/2018     Objective: Vital Signs: BP 118/66 (BP Location: Left Arm, Patient Position: Sitting, Cuff Size: Normal)   Pulse 62   Resp 16   Ht 5' 5.5" (1.664 m)   Wt 145 lb (65.8 kg)   BMI 23.76 kg/m    Physical Exam Vitals and nursing note reviewed.  Constitutional:      Appearance: She is well-developed.  HENT:     Head: Normocephalic and atraumatic.  Eyes:      Conjunctiva/sclera: Conjunctivae normal.  Cardiovascular:     Rate and Rhythm: Normal rate and regular rhythm.     Heart sounds: Normal heart sounds.  Pulmonary:     Effort: Pulmonary effort is normal.     Breath sounds: Normal breath sounds.  Abdominal:     General: Bowel sounds are normal.     Palpations: Abdomen is soft.  Musculoskeletal:     Cervical back: Normal range of motion.  Lymphadenopathy:     Cervical: No cervical adenopathy.  Skin:    General: Skin is warm and dry.     Capillary Refill: Capillary refill takes less than 2 seconds.  Neurological:     Mental Status: She is alert and oriented to person, place, and time.  Psychiatric:        Behavior: Behavior normal.     Musculoskeletal Exam: C-spine slightly limited ROM with lateral rotation.  Some trapezius muscle tension and tenderness bilaterally.  Thoracic and lumbar spine have good ROM.  Tenderness over the right SI joint.  Shoulder joints and elbow joints have good ROM with no discomfort. Limited extension of the left wrist.  No tenderness or synovitis of MCP joints.  Tenderness and inflammation of the right 2nd PIP joint.  PIP and DIP thickening consistent with OA of both hands. Hip joints have good ROM with no discomfort.  Knee joints have good ROM with no warmth or effusion.  Ankle joints have good ROM with no tenderness or joint swelling. No tenderness over MTP joints.   CDAI Exam: CDAI Score: -- Patient Global: --; Provider Global: -- Swollen: --; Tender: -- Joint Exam 03/19/2021   No joint exam has been documented for this visit   There is currently no information documented on the homunculus. Go to the Rheumatology activity and complete the homunculus joint exam.  Investigation: No additional findings.  Imaging: No results found.  Recent Labs: Lab Results  Component Value Date   WBC 4.2 02/13/2021   HGB 13.1 02/13/2021   PLT 176 02/13/2021   NA 135 03/06/2021   K 4.4 03/06/2021   CL 103  03/06/2021   CO2 29 03/06/2021   GLUCOSE 95 03/06/2021   BUN 16 03/06/2021   CREATININE 0.80 03/06/2021   BILITOT 0.5 03/06/2021     ALKPHOS 79 12/13/2017   AST 26 03/06/2021   ALT 12 03/06/2021   PROT 6.6 03/06/2021   ALBUMIN 3.6 12/13/2017   CALCIUM 9.7 03/06/2021   GFRAA 81 10/16/2020    Speciality Comments: PLQ Eye Exam: 07/01/2020 normal @ [facility], [place]. 6 months  Procedures:  No procedures performed Allergies: Codeine, Robinul [glycopyrrolate], and Sulfamethoxazole-trimethoprim      Assessment / Plan:     Visit Diagnoses: Autoimmune disease ([place]) - +ANA +Ro +La +RF oral ulcers nasal ulcers Raynaud photosensitivity, sicca: She has not had any signs or symptoms of a systemic autoimmune disease flare recently.  She has clinically been doing well taking Plaquenil 200 mg 1 tablet by mouth twice daily Monday through Friday.  She has ongoing sicca symptoms which are tolerable with the use of restasis and biotene products.  She has not had any oral or nasal ulcers.  No malar rash was noted on exam today.  Discussed the importance of avoiding direct sun exposure and to wear sunscreen daily.  She has been wearing UV protective clothing while working in her yard this summer.  She has not had any symptoms of raynaud's recently.  No digital ulcerations or signs of gangrene were noted.  She will remain on amlodipine 2.5 mg daily.  She has persistent pain in the right index finger and has occasional pain in both knee joints but overall her arthralgias have been tolerable.  She remains active performing yard work and walking for exercise. She is not experiencing any shortness of breath at this time.  Lungs were clear to auscultation.  No pleuritic chest pain.  She has occasional episodes of afib usually when she is stressed.   Lab work from 03/06/2021 was reviewed today in the office: ANA negative, double-stranded ENA negative, ESR within normal limits, complements within normal limits,  and UA normal.  All questions were addressed. Labs are not consistent with a flare.  She will remain on the current dose of PLQ.  She was advised to notify us if she develops any new or worsening symptoms.  She will follow up in 5 months.  - Plan: Protein / creatinine ratio, urine, COMPLETE METABOLIC PANEL WITH GFR, CBC with Differential/Platelet, Anti-DNA antibody, double-stranded, C3 and C4, Sedimentation rate, Rheumatoid factor, Serum protein electrophoresis with reflex  Sjogren's syndrome with keratoconjunctivitis sicca ([facility]) - She continues to have chronic sicca symptoms.  She uses Biotene products as needed and tries to keep her water intake up throughout the day for symptomatic relief.  She sees the dentist every 6 months as recommended.  She continues to use Restasis eyedrops to alleviate symptoms of dry eye.  She has been seeing her ophthalmologist every 6 months as recommended.  Plan: Protein / creatinine ratio, urine, COMPLETE METABOLIC PANEL WITH GFR, CBC with Differential/Platelet, Rheumatoid factor, Serum protein electrophoresis with reflex  High risk medication use - Plaquenil 200 mg 1 tablet by mouth BID Monday through Friday.  CBC within normal limits on 02/13/2021.  CMP updated on 03/06/2021.  Future orders for CBC and CMP were placed today to have drawn prior to her next appointment in 5 months. - Plan: COMPLETE METABOLIC PANEL WITH GFR, CBC with Differential/Platelet PLQ Eye Exam: 07/01/2020 normal @ [facility], [place].   Raynaud's disease without gangrene: Not currently active.  She has good capillary refill on exam.  No digital ulcerations or signs of sclerodactyly noted.  She remains on norvasc 2.5 mg daily.    Primary osteoarthritis of both hands:  She has PIP and DIP thickening consistent with osteoarthritis of both hands.  She has limited flexion and extension of the right index finger. Tenderness and inflammation of the right 2nd PIP and left DIP of the 5th digit.  Discussed the importance of joint protection and muscle strengthening. Strongly encouraged to perform hand strengthening exercises.   Primary osteoarthritis of both knees: She experiences intermittent discomfort in both knee joints, left greater than right.  She has not had any recent injury or fall.  She has no mechanical symptoms.  She has good range of motion of both knee joints on examination today.  No warmth or effusion was noted.  Primary osteoarthritis of both feet: She has not experiencing any discomfort in her feet at this time.  She has good range of motion of both ankle joints with no tenderness or joint swelling.  No tenderness over MTP joints.  Some pedal edema was noted in both feet.  Age-related osteoporosis without current pathological fracture - 12/14/18 DEXA BMD 0.523 with T-score -2.9 1/3 right forearm.  According to the patient she had an updated bone density spring 2022 so we will try to call to obtain these records.  She is prescribed Evista 60 mg 3 times weekly prescribed by her gynecologist.  She continues to take vitamin D 1000 units daily.  She has not had any recent falls or fractures.  She occasionally feels unsteady on her feet and was strongly encouraged to use a cane or walker to assist with ambulation.  We discussed the importance of staying active and working on lower extremity muscle strengthening.  Other medical conditions are listed as follows:  History of hypertension: Blood pressure was 118/66 today in the office.  History of hypothyroidism: She remains on Synthroid as prescribed.  History of gastroesophageal reflux (GERD)/ PUD  History of MRSA infection  History of atrial fibrillation: She remains on Eliquis and sotaol as prescribed.   Orders: Orders Placed This Encounter  Procedures   Protein / creatinine ratio, urine   COMPLETE METABOLIC PANEL WITH GFR   CBC with Differential/Platelet   Anti-DNA antibody, double-stranded   C3 and C4   Sedimentation  rate   Rheumatoid factor   Serum protein electrophoresis with reflex    No orders of the defined types were placed in this encounter.     Follow-Up Instructions: Return in about 5 months (around 08/19/2021) for Autoimmune Disease, Sjogren's syndrome, Osteoarthritis.   Taylor M Dale, PA-C  Note - This record has been created using Dragon software.  Chart creation errors have been sought, but may not always  have been located. Such creation errors do not reflect on  the standard of medical care.  

## 2021-03-09 LAB — URINALYSIS, ROUTINE W REFLEX MICROSCOPIC
Bilirubin Urine: NEGATIVE
Glucose, UA: NEGATIVE
Hgb urine dipstick: NEGATIVE
Ketones, ur: NEGATIVE
Leukocytes,Ua: NEGATIVE
Nitrite: NEGATIVE
Protein, ur: NEGATIVE
Specific Gravity, Urine: 1.013 (ref 1.001–1.035)
pH: 7 (ref 5.0–8.0)

## 2021-03-09 LAB — C3 AND C4
C3 Complement: 126 mg/dL (ref 83–193)
C4 Complement: 21 mg/dL (ref 15–57)

## 2021-03-09 LAB — COMPLETE METABOLIC PANEL WITH GFR
AG Ratio: 1.1 (calc) (ref 1.0–2.5)
ALT: 12 U/L (ref 6–29)
AST: 26 U/L (ref 10–35)
Albumin: 3.5 g/dL — ABNORMAL LOW (ref 3.6–5.1)
Alkaline phosphatase (APISO): 74 U/L (ref 37–153)
BUN: 16 mg/dL (ref 7–25)
CO2: 29 mmol/L (ref 20–32)
Calcium: 9.7 mg/dL (ref 8.6–10.4)
Chloride: 103 mmol/L (ref 98–110)
Creat: 0.8 mg/dL (ref 0.60–0.95)
Globulin: 3.1 g/dL (calc) (ref 1.9–3.7)
Glucose, Bld: 95 mg/dL (ref 65–99)
Potassium: 4.4 mmol/L (ref 3.5–5.3)
Sodium: 135 mmol/L (ref 135–146)
Total Bilirubin: 0.5 mg/dL (ref 0.2–1.2)
Total Protein: 6.6 g/dL (ref 6.1–8.1)
eGFR: 74 mL/min/{1.73_m2} (ref >=60)

## 2021-03-09 LAB — SEDIMENTATION RATE: Sed Rate: 17 mm/h (ref 0–30)

## 2021-03-09 LAB — ANTI-DNA ANTIBODY, DOUBLE-STRANDED: ds DNA Ab: 1 IU/mL

## 2021-03-09 LAB — ANA: Anti Nuclear Antibody (ANA): NEGATIVE

## 2021-03-09 NOTE — Progress Notes (Signed)
CMP, sed rate, UA, complements, ANA, double-stranded DNA, complements are all within normal limits.

## 2021-03-15 ENCOUNTER — Other Ambulatory Visit: Payer: Self-pay | Admitting: Rheumatology

## 2021-03-15 DIAGNOSIS — Z79899 Other long term (current) drug therapy: Secondary | ICD-10-CM

## 2021-03-15 DIAGNOSIS — M359 Systemic involvement of connective tissue, unspecified: Secondary | ICD-10-CM

## 2021-03-16 NOTE — Telephone Encounter (Signed)
Last Visit: 10/16/2020  Next Visit: 03/19/2021  Labs: 03/06/2021 CMP, sed rate, UA, complements, ANA, double-stranded DNA, complements are all within normal limits 02/13/2021 CBC WNL  Eye exam: 07/01/2020 normal    Current Dose per office note 10/16/2020: PLQ 200 mg 1 tablet by mouth BID Monday through Friday  DX: Autoimmune disease    Last Fill: 12/23/2020   Okay to refill per Dr. Estanislado Pandy

## 2021-03-19 ENCOUNTER — Ambulatory Visit: Payer: PPO | Admitting: Physician Assistant

## 2021-03-19 ENCOUNTER — Other Ambulatory Visit: Payer: Self-pay

## 2021-03-19 ENCOUNTER — Encounter: Payer: Self-pay | Admitting: Physician Assistant

## 2021-03-19 VITALS — BP 118/66 | HR 62 | Resp 16 | Ht 65.5 in | Wt 145.0 lb

## 2021-03-19 DIAGNOSIS — Z8719 Personal history of other diseases of the digestive system: Secondary | ICD-10-CM | POA: Diagnosis not present

## 2021-03-19 DIAGNOSIS — Z8679 Personal history of other diseases of the circulatory system: Secondary | ICD-10-CM | POA: Diagnosis not present

## 2021-03-19 DIAGNOSIS — Z8614 Personal history of Methicillin resistant Staphylococcus aureus infection: Secondary | ICD-10-CM

## 2021-03-19 DIAGNOSIS — M810 Age-related osteoporosis without current pathological fracture: Secondary | ICD-10-CM | POA: Diagnosis not present

## 2021-03-19 DIAGNOSIS — I7300 Raynaud's syndrome without gangrene: Secondary | ICD-10-CM

## 2021-03-19 DIAGNOSIS — Z79899 Other long term (current) drug therapy: Secondary | ICD-10-CM | POA: Diagnosis not present

## 2021-03-19 DIAGNOSIS — M359 Systemic involvement of connective tissue, unspecified: Secondary | ICD-10-CM

## 2021-03-19 DIAGNOSIS — Z8639 Personal history of other endocrine, nutritional and metabolic disease: Secondary | ICD-10-CM | POA: Diagnosis not present

## 2021-03-19 DIAGNOSIS — M19071 Primary osteoarthritis, right ankle and foot: Secondary | ICD-10-CM

## 2021-03-19 DIAGNOSIS — M170 Bilateral primary osteoarthritis of knee: Secondary | ICD-10-CM

## 2021-03-19 DIAGNOSIS — M19042 Primary osteoarthritis, left hand: Secondary | ICD-10-CM

## 2021-03-19 DIAGNOSIS — M19041 Primary osteoarthritis, right hand: Secondary | ICD-10-CM | POA: Diagnosis not present

## 2021-03-19 DIAGNOSIS — M3501 Sicca syndrome with keratoconjunctivitis: Secondary | ICD-10-CM | POA: Diagnosis not present

## 2021-03-19 DIAGNOSIS — M19072 Primary osteoarthritis, left ankle and foot: Secondary | ICD-10-CM

## 2021-03-23 ENCOUNTER — Telehealth: Payer: Self-pay | Admitting: *Deleted

## 2021-03-23 NOTE — Telephone Encounter (Signed)
Received DEXA results from [facility].  Date of Scan: 12/15/2020  Dx: Osteoporosis  Lowest T-score:-3.3  BMD:0.498  Lowest site measured:1/3 Right Forearm   Significant changes in BMD and site measured (5% and above):n/a  Current Regimen:Evista 60 mg three times weekly. Vitamin D  Recommendation:Schedule patient an appointment to discuss treatment option in the morning when [facility] is in the office.  Reviewed by:Dr. Bo Merino   Next Appointment:  08/21/2021  Spoke with patient and advised patient she has an appointment scheduled with [facility] to discuss treatment options in November. Offered patient an earlier appointment in our office to discuss treatment options. Patient states she will call the office if she chooses to schedule.

## 2021-03-29 ENCOUNTER — Other Ambulatory Visit: Payer: Self-pay | Admitting: Cardiology

## 2021-04-16 ENCOUNTER — Other Ambulatory Visit: Payer: Self-pay | Admitting: Gastroenterology

## 2021-04-17 ENCOUNTER — Other Ambulatory Visit: Payer: Self-pay

## 2021-04-17 ENCOUNTER — Telehealth: Payer: Self-pay | Admitting: Gastroenterology

## 2021-04-17 MED ORDER — OMEPRAZOLE 20 MG PO CPDR: 20.0000 mg | DELAYED_RELEASE_CAPSULE | Freq: Every day | ORAL | 0 refills | Status: DC

## 2021-04-17 NOTE — Telephone Encounter (Signed)
Inbound call from patient requesting a refill for omeprazole sent to [facility] on [address]. Patient is requesting 3 months supply to not have to go every month.

## 2021-04-17 NOTE — Telephone Encounter (Signed)
90 days supply given with no refills

## 2021-05-02 DIAGNOSIS — Z23 Encounter for immunization: Secondary | ICD-10-CM | POA: Diagnosis not present

## 2021-05-21 DIAGNOSIS — Z01419 Encounter for gynecological examination (general) (routine) without abnormal findings: Secondary | ICD-10-CM | POA: Diagnosis not present

## 2021-05-21 DIAGNOSIS — Z6824 Body mass index (BMI) 24.0-24.9, adult: Secondary | ICD-10-CM | POA: Diagnosis not present

## 2021-05-25 DIAGNOSIS — I1310 Hypertensive heart and chronic kidney disease without heart failure, with stage 1 through stage 4 chronic kidney disease, or unspecified chronic kidney disease: Secondary | ICD-10-CM | POA: Diagnosis not present

## 2021-05-25 DIAGNOSIS — I2510 Atherosclerotic heart disease of native coronary artery without angina pectoris: Secondary | ICD-10-CM | POA: Diagnosis not present

## 2021-05-25 DIAGNOSIS — N1831 Chronic kidney disease, stage 3a: Secondary | ICD-10-CM | POA: Diagnosis not present

## 2021-05-25 DIAGNOSIS — M810 Age-related osteoporosis without current pathological fracture: Secondary | ICD-10-CM | POA: Diagnosis not present

## 2021-06-03 DIAGNOSIS — H0100A Unspecified blepharitis right eye, upper and lower eyelids: Secondary | ICD-10-CM | POA: Diagnosis not present

## 2021-06-03 DIAGNOSIS — H04123 Dry eye syndrome of bilateral lacrimal glands: Secondary | ICD-10-CM | POA: Diagnosis not present

## 2021-06-03 DIAGNOSIS — H0100B Unspecified blepharitis left eye, upper and lower eyelids: Secondary | ICD-10-CM | POA: Diagnosis not present

## 2021-06-05 DIAGNOSIS — I2510 Atherosclerotic heart disease of native coronary artery without angina pectoris: Secondary | ICD-10-CM | POA: Diagnosis not present

## 2021-06-05 DIAGNOSIS — E559 Vitamin D deficiency, unspecified: Secondary | ICD-10-CM | POA: Diagnosis not present

## 2021-06-05 DIAGNOSIS — E039 Hypothyroidism, unspecified: Secondary | ICD-10-CM | POA: Diagnosis not present

## 2021-06-05 DIAGNOSIS — E785 Hyperlipidemia, unspecified: Secondary | ICD-10-CM | POA: Diagnosis not present

## 2021-06-07 ENCOUNTER — Other Ambulatory Visit: Payer: Self-pay | Admitting: Cardiology

## 2021-06-09 DIAGNOSIS — R82998 Other abnormal findings in urine: Secondary | ICD-10-CM | POA: Diagnosis not present

## 2021-06-10 DIAGNOSIS — D899 Disorder involving the immune mechanism, unspecified: Secondary | ICD-10-CM | POA: Diagnosis not present

## 2021-06-10 DIAGNOSIS — Z0000 Encounter for general adult medical examination without abnormal findings: Secondary | ICD-10-CM | POA: Diagnosis not present

## 2021-06-10 DIAGNOSIS — I2510 Atherosclerotic heart disease of native coronary artery without angina pectoris: Secondary | ICD-10-CM | POA: Diagnosis not present

## 2021-06-10 DIAGNOSIS — E785 Hyperlipidemia, unspecified: Secondary | ICD-10-CM | POA: Diagnosis not present

## 2021-06-10 DIAGNOSIS — E039 Hypothyroidism, unspecified: Secondary | ICD-10-CM | POA: Diagnosis not present

## 2021-06-10 DIAGNOSIS — F325 Major depressive disorder, single episode, in full remission: Secondary | ICD-10-CM | POA: Diagnosis not present

## 2021-06-10 DIAGNOSIS — D696 Thrombocytopenia, unspecified: Secondary | ICD-10-CM | POA: Diagnosis not present

## 2021-06-10 DIAGNOSIS — N1831 Chronic kidney disease, stage 3a: Secondary | ICD-10-CM | POA: Diagnosis not present

## 2021-06-10 DIAGNOSIS — I480 Paroxysmal atrial fibrillation: Secondary | ICD-10-CM | POA: Diagnosis not present

## 2021-06-10 DIAGNOSIS — D6869 Other thrombophilia: Secondary | ICD-10-CM | POA: Diagnosis not present

## 2021-06-10 DIAGNOSIS — I1310 Hypertensive heart and chronic kidney disease without heart failure, with stage 1 through stage 4 chronic kidney disease, or unspecified chronic kidney disease: Secondary | ICD-10-CM | POA: Diagnosis not present

## 2021-06-10 DIAGNOSIS — M3210 Systemic lupus erythematosus, organ or system involvement unspecified: Secondary | ICD-10-CM | POA: Diagnosis not present

## 2021-06-24 DIAGNOSIS — E785 Hyperlipidemia, unspecified: Secondary | ICD-10-CM | POA: Diagnosis not present

## 2021-06-24 DIAGNOSIS — I1310 Hypertensive heart and chronic kidney disease without heart failure, with stage 1 through stage 4 chronic kidney disease, or unspecified chronic kidney disease: Secondary | ICD-10-CM | POA: Diagnosis not present

## 2021-06-24 DIAGNOSIS — N1831 Chronic kidney disease, stage 3a: Secondary | ICD-10-CM | POA: Diagnosis not present

## 2021-06-24 DIAGNOSIS — I2510 Atherosclerotic heart disease of native coronary artery without angina pectoris: Secondary | ICD-10-CM | POA: Diagnosis not present

## 2021-07-13 ENCOUNTER — Other Ambulatory Visit: Payer: Self-pay | Admitting: Gastroenterology

## 2021-07-14 ENCOUNTER — Ambulatory Visit (HOSPITAL_COMMUNITY)
Admission: RE | Admit: 2021-07-14 | Discharge: 2021-07-14 | Disposition: A | Discharge status: Home / Self Care | Payer: PPO | Source: Ambulatory Visit | Attending: Nurse Practitioner | Admitting: Nurse Practitioner

## 2021-07-14 ENCOUNTER — Encounter (HOSPITAL_COMMUNITY): Payer: Self-pay | Admitting: Nurse Practitioner

## 2021-07-14 ENCOUNTER — Other Ambulatory Visit: Payer: Self-pay

## 2021-07-14 VITALS — BP 132/84 | HR 60 | Ht 65.5 in | Wt 145.0 lb

## 2021-07-14 DIAGNOSIS — E785 Hyperlipidemia, unspecified: Secondary | ICD-10-CM | POA: Diagnosis not present

## 2021-07-14 DIAGNOSIS — I2510 Atherosclerotic heart disease of native coronary artery without angina pectoris: Secondary | ICD-10-CM | POA: Diagnosis not present

## 2021-07-14 DIAGNOSIS — Z7901 Long term (current) use of anticoagulants: Secondary | ICD-10-CM | POA: Diagnosis not present

## 2021-07-14 DIAGNOSIS — Z79899 Other long term (current) drug therapy: Secondary | ICD-10-CM | POA: Insufficient documentation

## 2021-07-14 DIAGNOSIS — I10 Essential (primary) hypertension: Secondary | ICD-10-CM | POA: Insufficient documentation

## 2021-07-14 DIAGNOSIS — D6869 Other thrombophilia: Secondary | ICD-10-CM | POA: Diagnosis not present

## 2021-07-14 DIAGNOSIS — I480 Paroxysmal atrial fibrillation: Secondary | ICD-10-CM | POA: Insufficient documentation

## 2021-07-14 DIAGNOSIS — M3500 Sicca syndrome, unspecified: Secondary | ICD-10-CM | POA: Diagnosis not present

## 2021-07-14 LAB — BASIC METABOLIC PANEL
Anion gap: 4 — ABNORMAL LOW (ref 5–15)
BUN: 16 mg/dL (ref 8–23)
CO2: 27 mmol/L (ref 22–32)
Calcium: 10 mg/dL (ref 8.9–10.3)
Chloride: 105 mmol/L (ref 98–111)
Creatinine, Ser: 0.89 mg/dL (ref 0.44–1.00)
GFR, Estimated: >60 mL/min (ref >60)
Glucose, Bld: 87 mg/dL (ref 70–99)
Potassium: 4.2 mmol/L (ref 3.5–5.1)
Sodium: 136 mmol/L (ref 135–145)

## 2021-07-14 LAB — MAGNESIUM: Magnesium: 2.2 mg/dL (ref 1.7–2.4)

## 2021-07-14 NOTE — Progress Notes (Addendum)
Primary Care Physician: Shon Baton, MD Primary Cardiologist: Dr Marlou Porch Primary Electrophysiologist: none Referring Physician: [facility]/Dr Marlou Porch   Alice Medina is a 80 y.o. female with a history of CAD with myocardial bridging, Sjogren's syndrome, HTN, HLD, and atrial fibrillation who presents for f/u  in the [facility]. The patient was initially diagnosed with atrial fibrillation remotely and has been maintained on sotalol. Patient since being seen last, has started  anticoagulation for a CHADS2VASC score of 5. She is now on eliquis 5 mg bid. She has not noted any afib os sotalol.   Today, she denies symptoms of palpitations, chest pain, shortness of breath, orthopnea, PND, lower extremity edema, dizziness, presyncope, syncope, snoring, daytime somnolence, bleeding, or neurologic sequela. The patient is tolerating medications without difficulties and is otherwise without complaint today.    Atrial Fibrillation Risk Factors:  she does not have symptoms or diagnosis of sleep apnea. she does not have a history of rheumatic fever.   she has a BMI of Body mass index is 23.76 kg/m.Marland Kitchen Filed Weights   07/14/21 0938  Weight: 65.8 kg    Family History  Problem Relation Age of Onset   Breast cancer Mother    Lung cancer Mother    Cancer Father        larynx   Heart failure Father    Heart disease Brother    Colon cancer Brother    Pseudochol deficiency Other    Breast cancer Sister    Cancer Sister        thyroid    Breast cancer Maternal Grandmother    Autoimmune disease Daughter    Cancer Son        prostate    Diabetes Son    Rheum arthritis Grandchild    Esophageal cancer Neg Hx    Rectal cancer Neg Hx    Stomach cancer Neg Hx      Atrial Fibrillation Management history:  Previous antiarrhythmic drugs: sotalol Previous cardioversions: none Previous ablations: none CHADS2VASC score: 5 Anticoagulation history: none   Past  Medical History:  Diagnosis Date   ADENOCARCINOMA, BREAST 05/02/2009   Qualifier: Diagnosis of  By: Deatra Ina MD, Sandy Salaam    Anxiety    Arthritis    Autoimmune disorder ([place])    SJORGREN'S   Breast cancer ([place])    right breast   Chronic headaches    CORONARY ARTERY DISEASE    cath in 1990 (normal Left main, normal CFX, normal RCA, myocardial bridge in LAD per Dr. Thurman Coyer note)   Depression    Diarrhea 05/28/2013   DYSPHAGIA UNSPECIFIED 05/02/2009   Qualifier: Diagnosis of  By: Deatra Ina MD, Sandy Salaam    Family history of malignant neoplasm of gastrointestinal tract 06/22/2011   Brother with colon cancer    GERD (gastroesophageal reflux disease)    Hemorrhage of rectum and anus 06/22/2011   History of hypothyroidism 01/13/2017   History of MRSA infection 01/13/2017   Hypertension    Multiple closed fractures of metacarpal bone 07/25/2017   Myocardial bridge    Myocardial infarction [facility])    1990   Neuromuscular disorder ([place])    LUPUS   Osteopenia of multiple sites 01/13/2017   Osteoporosis    PAC (premature atrial contraction) 04/07/2020   PAF (paroxysmal atrial fibrillation) (HCC)    Photosensitivity 01/13/2017   Primary osteoarthritis of both hands 01/13/2017   Raynaud's disease without gangrene 01/13/2017   Recurrent oral ulcers and nasal ulcers  01/13/2017  Sjogren's syndrome with keratoconjunctivitis sicca ([place]) 01/13/2017   Status post dilation of esophageal narrowing    Thyroid disease    Ulcer    Past Surgical History:  Procedure Laterality Date   APPENDECTOMY     breast reconstuction     x 2   CHOLECYSTECTOMY     COLONOSCOPY     double mastectomy     PARTIAL HYSTERECTOMY  1990   TIBIA FRACTURE SURGERY Right    with subsequent hardware removal   UPPER GASTROINTESTINAL ENDOSCOPY     WRIST FRACTURE SURGERY Left     Current Outpatient Medications  Medication Sig Dispense Refill   amLODipine (NORVASC) 2.5 MG tablet TAKE 1 TABLET(2.5 MG) BY MOUTH DAILY 90  tablet 3   apixaban (ELIQUIS) 5 MG TABS tablet Take 1 tablet (5 mg total) by mouth 2 (two) times daily. 60 tablet 11   Budesonide-Formoterol Fumarate (SYMBICORT IN) Inhale into the lungs as needed.      cholecalciferol (VITAMIN D) 1000 UNITS tablet Take 1,000 Units by mouth daily.     cyclobenzaprine (FLEXERIL) 10 MG tablet Take 10 mg by mouth at bedtime as needed for muscle spasms.     cycloSPORINE (RESTASIS) 0.05 % ophthalmic emulsion Place 1 drop into both eyes 2 (two) times daily.     diclofenac Sodium (VOLTAREN) 1 % GEL Apply 2-4 grams to affected joint 4 times daily as needed. 400 g 2   DULoxetine (CYMBALTA) 60 MG capsule Take 60 mg by mouth daily.     hydroxychloroquine (PLAQUENIL) 200 MG tablet TAKE 1 TABLET BY MOUTH TWICE DAILY( EVERY TWELVE HOURS) MONDAY THRU FRIDAY ONLY 120 tablet 0   levothyroxine (SYNTHROID, LEVOTHROID) 50 MCG tablet Take 50 mcg by mouth daily.     LORazepam (ATIVAN) 2 MG tablet Take 2 mg by mouth at bedtime.     losartan (COZAAR) 100 MG tablet TAKE 1 TABLET(100 MG) BY MOUTH DAILY 90 tablet 3   magnesium oxide (MAG-OX) 400 MG tablet Take 1 tablet (400 mg total) by mouth 2 (two) times daily. 60 tablet 11   Multiple Vitamins-Minerals (MULTIVITAMIN WITH MINERALS) tablet Take 1 tablet by mouth daily.     omeprazole (PRILOSEC) 20 MG capsule TAKE 1 CAPSULE(20 MG) BY MOUTH DAILY 90 capsule 0   sotalol (BETAPACE) 80 MG tablet TAKE 1 TABLET BY MOUTH TWICE DAILY. 180 tablet 2   No current facility-administered medications for this encounter.    Allergies  Allergen Reactions   Codeine Other (See Comments)    Made stomach spasm   Robinul [Glycopyrrolate] Other (See Comments)    AFIB, made heart race and BP go up   Sulfamethoxazole-Trimethoprim Other (See Comments)    Peripheral neuropathy in hands    Social History   Socioeconomic History   Marital status: Widowed    Spouse name: Not on file   Number of children: 2   Years of education: Not on file   Highest  education level: Not on file  Occupational History   Occupation: retired LPN  Tobacco Use   Smoking status: Never   Smokeless tobacco: Never  Vaping Use   Vaping Use: Never used  Substance and Sexual Activity   Alcohol use: Not Currently   Drug use: Never   Sexual activity: Not on file  Other Topics Concern   Not on file  Social History Narrative   1 cup of coffee daily   Social Determinants of Health   Financial Resource Strain: Not on file  Food Insecurity: Not  on file  Transportation Needs: Not on file  Physical Activity: Not on file  Stress: Not on file  Social Connections: Not on file  Intimate Partner Violence: Not on file     ROS- All systems are reviewed and negative except as per the HPI above.  Physical Exam: Vitals:   07/14/21 0938  BP: 132/84  Pulse: 60  SpO2: 99%  Weight: 65.8 kg  Height: 5' 5.5" (1.664 m)    GEN- The patient is well appearing elderly female, alert and oriented x 3 today.   Head- normocephalic, atraumatic Eyes-  Sclera clear, conjunctiva pink Ears- hearing intact Oropharynx- clear Neck- supple  Lungs- Clear to ausculation bilaterally, normal work of breathing Heart- Regular rate and rhythm, occasional ectopic beat, no murmurs, rubs or gallops  GI- soft, NT, ND, + BS Extremities- no clubbing, cyanosis, or edema MS- no significant deformity or atrophy Skin- no rash or lesion Psych- euthymic mood, full affect Neuro- strength and sensation are intact  Wt Readings from Last 3 Encounters:  07/14/21 65.8 kg  03/19/21 65.8 kg  01/14/21 63 kg    EKG today demonstrates SR  HR 60,  1st degree AV block, PR 258,  QRS 84, QTc 452 ms   Echo 05/08/08 demonstrated  LVEF 65%, no significant valve issues.  Epic records are reviewed at length today  CHA2DS2-VASc Score =  4   The patient's score is based upon:        ASSESSMENT AND PLAN: 1. Paroxysmal Atrial Fibrillation (ICD10:  I48.0)  Patient back in SR. She has done well with  sotalol for many years. Will continue present therapy unless her afib becomes more frequent/persistent.  Continue sotalol 80 mg BID bmet/mag today   2. Secondary Hypercoagulable State (ICD10:  D68.69) The patient is at significant risk for stroke/thromboembolism based upon her CHA2DS2-VASc Score of 4 .   Continue eliquis 5 mg bid    3. HTN Stable   4. CAD No anginal symptoms.   Follow up in the AF clinic in  6 months   Butch Penny C. Jakai Onofre, [facility] [address] [place], [place] [address] [phone]

## 2021-07-15 ENCOUNTER — Telehealth: Payer: Self-pay | Admitting: Rheumatology

## 2021-07-15 ENCOUNTER — Other Ambulatory Visit: Payer: Self-pay | Admitting: *Deleted

## 2021-07-15 DIAGNOSIS — Z79899 Other long term (current) drug therapy: Secondary | ICD-10-CM

## 2021-07-15 DIAGNOSIS — M359 Systemic involvement of connective tissue, unspecified: Secondary | ICD-10-CM

## 2021-07-15 MED ORDER — HYDROXYCHLOROQUINE SULFATE 200 MG PO TABS: ORAL_TABLET | ORAL | 0 refills | Status: DC

## 2021-07-15 NOTE — Telephone Encounter (Signed)
Patient called the office requesting a refill of her Hydroxychloroquine 200 mg to be sent to [facility] on [address]. Patient stated the pharmacy told her that her prescription needed approval from Dr. Estanislado Pandy.

## 2021-07-15 NOTE — Telephone Encounter (Signed)
Prescription sent to the pharmacy this morning.

## 2021-07-15 NOTE — Telephone Encounter (Signed)
Refill request received via fax  Next Visit: 08/21/2021  Last Visit: 03/19/2021  Labs: 03/06/2021 CMP, sed rate, UA, complements, ANA, double-stranded DNA, complements are all within normal limits.  Eye exam:  07/01/2020 normal   Current Dose per office note 03/19/2021: Plaquenil 200 mg 1 tablet by mouth BID Monday through Friday.  DX: Autoimmune disease   Last Fill: 03/16/2021  Okay to refill Plaquenil?

## 2021-07-24 DIAGNOSIS — I1310 Hypertensive heart and chronic kidney disease without heart failure, with stage 1 through stage 4 chronic kidney disease, or unspecified chronic kidney disease: Secondary | ICD-10-CM | POA: Diagnosis not present

## 2021-07-24 DIAGNOSIS — E785 Hyperlipidemia, unspecified: Secondary | ICD-10-CM | POA: Diagnosis not present

## 2021-07-24 DIAGNOSIS — I2510 Atherosclerotic heart disease of native coronary artery without angina pectoris: Secondary | ICD-10-CM | POA: Diagnosis not present

## 2021-07-24 DIAGNOSIS — N1831 Chronic kidney disease, stage 3a: Secondary | ICD-10-CM | POA: Diagnosis not present

## 2021-08-07 NOTE — Progress Notes (Signed)
Office Visit Note  Patient: Alice Medina             Date of Birth: 07-22-1941           MRN: [id number]             PCP: Shon Baton, MD Referring: Shon Baton, MD Visit Date: 08/21/2021 Occupation: @GUAROCC @  Subjective:  Left heel pain  History of Present Illness: Vinette Crites is a 81 y.o. female with history of autoimmune disease, Sjogren's, osteoarthritis and osteoporosis.  She continues to have dry mouth and dry eyes symptoms.  She also has discomfort in multiple joints including her hands, knee joints and her feet.  She states for the last 2 weeks she has been having pain and discomfort in her left heel.  She is having difficulty walking due to left heel discomfort.  She states she discussed her bone density with her GYN and decided against the treatment for osteoporosis.  Activities of Daily Living:  Patient reports morning stiffness for 30 minutes.   Patient Reports nocturnal pain.  Difficulty dressing/grooming: Denies Difficulty climbing stairs: Reports Difficulty getting out of chair: Reports Difficulty using hands for taps, buttons, cutlery, and/or writing: Reports  Review of Systems  Constitutional:  Positive for fatigue.  HENT:  Positive for mouth dryness and nose dryness. Negative for mouth sores.   Eyes:  Positive for dryness. Negative for pain and itching.  Respiratory:  Positive for shortness of breath. Negative for difficulty breathing.   Cardiovascular:  Positive for palpitations. Negative for chest pain.  Gastrointestinal:  Negative for blood in stool, constipation and diarrhea.  Endocrine: Negative for increased urination.  Genitourinary:  Negative for difficulty urinating.  Musculoskeletal:  Positive for joint pain, joint pain, joint swelling, myalgias, morning stiffness, muscle tenderness and myalgias.  Skin:  Positive for color change. Negative for rash and redness.  Allergic/Immunologic: Positive for susceptible to infections.  Neurological:   Positive for memory loss and weakness. Negative for dizziness, numbness and headaches.  Hematological:  Negative for bruising/bleeding tendency.  Psychiatric/Behavioral:  Negative for confusion.    PMFS History:  Patient Active Problem List   Diagnosis Date Noted   Paroxysmal atrial fibrillation ([place]) 04/07/2020   Secondary hypercoagulable state ([place]) 04/07/2020   Multiple closed fractures of metacarpal bone 07/25/2017   Autoimmune disease ([place]) +ANA +Ro +La +RF oral ulcers nasal ulcers Raynaud photosensitivity SICCA  01/13/2017   High risk medication use 01/13/2017   Photosensitivity 01/13/2017   Sjogren's syndrome with keratoconjunctivitis sicca ([place]) 01/13/2017   Recurrent oral ulcers and nasal ulcers  01/13/2017   Raynaud's disease without gangrene 01/13/2017   Primary osteoarthritis of both hands 01/13/2017   Primary osteoarthritis of both knees 01/13/2017   Primary osteoarthritis of both feet 01/13/2017   Osteopenia of multiple sites 01/13/2017   History of hypertension 01/13/2017   History of hypothyroidism 01/13/2017   History of gastroesophageal reflux (GERD)/ PUD 01/13/2017   History of MRSA infection 01/13/2017   Diarrhea 05/28/2013   Family history of malignant neoplasm of gastrointestinal tract 06/22/2011   ADENOCARCINOMA, BREAST 05/02/2009   CORONARY ARTERY DISEASE 05/02/2009   DYSPHAGIA UNSPECIFIED 05/02/2009    Past Medical History:  Diagnosis Date   ADENOCARCINOMA, BREAST 05/02/2009   Qualifier: Diagnosis of  By: Deatra Ina MD, Sandy Salaam    Anxiety    Arthritis    Autoimmune disorder ([place])    SJORGREN'S   Breast cancer ([place])    right breast   Chronic headaches    CORONARY ARTERY  DISEASE    cath in [place] (normal Left main, normal CFX, normal RCA, myocardial bridge in LAD per Dr. Thurman Coyer note)   Depression    Diarrhea 05/28/2013   DYSPHAGIA UNSPECIFIED 05/02/2009   Qualifier: Diagnosis of  By: Deatra Ina MD, Sandy Salaam    Family history of malignant neoplasm of  gastrointestinal tract 06/22/2011   Brother with colon cancer    GERD (gastroesophageal reflux disease)    Hemorrhage of rectum and anus 06/22/2011   History of hypothyroidism 01/13/2017   History of MRSA infection 01/13/2017   Hypertension    Multiple closed fractures of metacarpal bone 07/25/2017   Myocardial bridge    Myocardial infarction [facility])    1990   Neuromuscular disorder ([place])    LUPUS   Osteopenia of multiple sites 01/13/2017   Osteoporosis    PAC (premature atrial contraction) 04/07/2020   PAF (paroxysmal atrial fibrillation) (HCC)    Photosensitivity 01/13/2017   Primary osteoarthritis of both hands 01/13/2017   Raynaud's disease without gangrene 01/13/2017   Recurrent oral ulcers and nasal ulcers  01/13/2017   Sjogren's syndrome with keratoconjunctivitis sicca ([place]) 01/13/2017   Status post dilation of esophageal narrowing    Thyroid disease    Ulcer     Family History  Problem Relation Age of Onset   Breast cancer Mother    Lung cancer Mother    Cancer Father        larynx   Heart failure Father    Heart disease Brother    Colon cancer Brother    Pseudochol deficiency Other    Breast cancer Sister    Cancer Sister        thyroid    Breast cancer Maternal Grandmother    Autoimmune disease Daughter    Cancer Son        prostate    Diabetes Son    Rheum arthritis Grandchild    Esophageal cancer Neg Hx    Rectal cancer Neg Hx    Stomach cancer Neg Hx    Past Surgical History:  Procedure Laterality Date   APPENDECTOMY     breast reconstuction     x 2   CHOLECYSTECTOMY     COLONOSCOPY     double mastectomy     PARTIAL HYSTERECTOMY  1990   TIBIA FRACTURE SURGERY Right    with subsequent hardware removal   UPPER GASTROINTESTINAL ENDOSCOPY     WRIST FRACTURE SURGERY Left    Social History   Social History Narrative   1 cup of coffee daily   Immunization History  Administered Date(s) Administered   Influenza Split 05/07/2010, 05/14/2011,  05/12/2012, 04/19/2013, 04/15/2014   Influenza, Quadrivalent, Recombinant, Inj, Pf 04/22/2018, 04/07/2019, 05/10/2020   Influenza,inj,Quad PF,6+ Mos 04/15/2014, 05/19/2015   Influenza-Unspecified 04/24/2016, 04/23/2017   PFIZER(Purple Top)SARS-COV-2 Vaccination 09/03/2019, 09/28/2019, 06/27/2020   Pneumococcal Conjugate-13 11/20/2013, 12/24/2014   Pneumococcal Polysaccharide-23 09/08/2007, 01/06/2018   Td,absorbed, Preservative Free, Adult Use, Lf Unspecified 04/13/2011   Tdap 10/29/2009   Unspecified SARS-COV-2 Vaccination 09/03/2019, 09/28/2019   Zoster Recombinat (Shingrix) 01/10/2018, 05/11/2018, 09/19/2018   Zoster, Live 11/20/2013, 01/13/2018, 05/11/2018     Objective: Vital Signs: BP 139/82 (BP Location: Left Arm, Patient Position: Sitting, Cuff Size: Normal)    Pulse (!) 56    Ht 5' 4.5" (1.638 m)    Wt 144 lb 12.8 oz (65.7 kg)    BMI 24.47 kg/m    Physical Exam Vitals and nursing note reviewed.  Constitutional:      Appearance:  She is well-developed.  HENT:     Head: Normocephalic and atraumatic.  Eyes:     Conjunctiva/sclera: Conjunctivae normal.  Cardiovascular:     Rate and Rhythm: Normal rate and regular rhythm.     Heart sounds: Normal heart sounds.  Pulmonary:     Effort: Pulmonary effort is normal.     Breath sounds: Normal breath sounds.  Abdominal:     General: Bowel sounds are normal.     Palpations: Abdomen is soft.  Musculoskeletal:     Cervical back: Normal range of motion.  Lymphadenopathy:     Cervical: No cervical adenopathy.  Skin:    General: Skin is warm and dry.     Capillary Refill: Capillary refill takes less than 2 seconds.  Neurological:     Mental Status: She is alert and oriented to person, place, and time.  Psychiatric:        Behavior: Behavior normal.     Musculoskeletal Exam: She has some limitation with range of motion of her cervical and lumbar spine.  Shoulder joints and elbow joints in good range of motion.  Bilateral PIP and  DIP thickening was noted.  Some inflammation was noted in the right second PIP joint.  Hip joints and knee joints in good range of motion.  She had overcrowding of toes bilaterally.  She had tenderness on palpation over left heel.  No redness or swelling was noted.  CDAI Exam: CDAI Score: -- Patient Global: --; Provider Global: -- Swollen: --; Tender: -- Joint Exam 08/21/2021   No joint exam has been documented for this visit   There is currently no information documented on the homunculus. Go to the Rheumatology activity and complete the homunculus joint exam.  Investigation: No additional findings.  Imaging: No results found.  Recent Labs: Lab Results  Component Value Date   WBC 4.2 02/13/2021   HGB 13.1 02/13/2021   PLT 176 02/13/2021   NA 136 07/14/2021   K 4.2 07/14/2021   CL 105 07/14/2021   CO2 27 07/14/2021   GLUCOSE 87 07/14/2021   BUN 16 07/14/2021   CREATININE 0.89 07/14/2021   BILITOT 0.5 03/06/2021   ALKPHOS 79 12/13/2017   AST 26 03/06/2021   ALT 12 03/06/2021   PROT 6.6 03/06/2021   ALBUMIN 3.6 12/13/2017   CALCIUM 10.0 07/14/2021   GFRAA 81 10/16/2020    Speciality Comments: PLQ Eye Exam: 07/01/2020 normal @ [facility], [place]. 6 months  Procedures:  Foot Inj  Date/Time: 08/21/2021 12:10 PM Performed by: Bo Merino, MD Authorized by: Bo Merino, MD   Consent Given by:  Patient Site marked: the procedure site was marked   Timeout: prior to procedure the correct patient, procedure, and site was verified   Indications:  Fasciitis Condition: Plantar Fasciitis   Location: right plantar fascia muscle   Prep: patient was prepped and draped in usual sterile fashion   Needle Size:  27 G Approach: Plantar. Medications:  0.5 mL lidocaine 1 %; 20 mg triamcinolone acetonide 40 MG/ML Patient Tolerance:  Patient tolerated the procedure well with no immediate complications Allergies: Codeine, Robinul [glycopyrrolate], and  Sulfamethoxazole-trimethoprim   Assessment / Plan:     Visit Diagnoses: Autoimmune disease ([place]) - +ANA +Ro +La +RF oral ulcers nasal ulcers Raynaud photosensitivity, sicca:  -She continues to have sicca symptoms, Raynaud's phenomenon and photosensitivity.  Plan: Urinalysis, Routine w reflex microscopic, C3 and C4, Anti-DNA antibody, double-stranded, Sedimentation rate, Rheumatoid factor, Serum protein electrophoresis with reflex  Sjogren's  syndrome with keratoconjunctivitis sicca (HCC)-she continues to have dry mouth and dry eyes.  Over-the-counter products were discussed.  High risk medication use - Plaquenil 200 mg 1 tablet by mouth BID Monday through Friday. PLQ Eye Exam: 07/01/2020 - Plan: CBC with Differential/Platelet, COMPLETE METABOLIC PANEL WITH GFR today and then every 5 months.  Raynaud's disease without gangrene-keeping core temperature warm and warm clothing was discussed.  Pain of left heel -she has been experiencing pain and discomfort in her left heel over the last 2 weeks.  The clinical findings are consistent with plantar fasciitis.  She is having difficulty walking.  She also had some tenderness over plantar fascial.  Plan: DG Os [place]cis Left.  X-ray of the heel was unremarkable except for inferior calcaneal spur and dorsal spurring.  I discussed the option of cortisone injection for Planter fasciitis.  Patient decided to proceed with a cortisone injection.  Injection was performed as described above.  She tolerated the procedure well.  Postprocedure instructions were given.  I gave her a handout on stretching exercises for plantar fasciitis.  Primary osteoarthritis of both hands-she has severe osteoarthritis involving bilateral hands.  She had some inflammation in the right second PIP joint.  Primary osteoarthritis of both knees-she continues to have some discomfort.  No warmth swelling or effusion was noted.  Primary osteoarthritis of both feet-she severe arthritis with  overcrowding of her toes.  Age-related osteoporosis without current pathological fracture - 12/14/18 DEXA BMD 0.523 with T-score -2.9 1/3 right forearm.  Patient decided against the treatment for osteoporosis.  She had detailed discussion with her GYN per patient.  History of hypertension-blood pressure is normal today.  History of atrial fibrillation - She remains on Eliquis and sotalol as prescribed.   History of gastroesophageal reflux (GERD)/ PUD  History of hypothyroidism  History of MRSA infection  Orders: Orders Placed This Encounter  Procedures   XR Foot 2 Views Left   CBC with Differential/Platelet   COMPLETE METABOLIC PANEL WITH GFR   Urinalysis, Routine w reflex microscopic   C3 and C4   Anti-DNA antibody, double-stranded   Sedimentation rate   Rheumatoid factor   Serum protein electrophoresis with reflex   No orders of the defined types were placed in this encounter.    Follow-Up Instructions: Return in about 5 months (around 01/19/2022) for Sjogren's, Osteoarthritis.   Bo Merino, MD  Note - This record has been created using Editor, commissioning.  Chart creation errors have been sought, but may not always  have been located. Such creation errors do not reflect on  the standard of medical care.

## 2021-08-21 ENCOUNTER — Encounter: Payer: Self-pay | Admitting: Rheumatology

## 2021-08-21 ENCOUNTER — Ambulatory Visit: Payer: PPO | Admitting: Rheumatology

## 2021-08-21 ENCOUNTER — Ambulatory Visit: Payer: Self-pay

## 2021-08-21 ENCOUNTER — Other Ambulatory Visit: Payer: Self-pay

## 2021-08-21 VITALS — BP 139/82 | HR 56 | Ht 64.5 in | Wt 144.8 lb

## 2021-08-21 DIAGNOSIS — M170 Bilateral primary osteoarthritis of knee: Secondary | ICD-10-CM | POA: Diagnosis not present

## 2021-08-21 DIAGNOSIS — M19042 Primary osteoarthritis, left hand: Secondary | ICD-10-CM

## 2021-08-21 DIAGNOSIS — M3501 Sicca syndrome with keratoconjunctivitis: Secondary | ICD-10-CM

## 2021-08-21 DIAGNOSIS — M19072 Primary osteoarthritis, left ankle and foot: Secondary | ICD-10-CM

## 2021-08-21 DIAGNOSIS — Z79899 Other long term (current) drug therapy: Secondary | ICD-10-CM

## 2021-08-21 DIAGNOSIS — I7300 Raynaud's syndrome without gangrene: Secondary | ICD-10-CM | POA: Diagnosis not present

## 2021-08-21 DIAGNOSIS — M19071 Primary osteoarthritis, right ankle and foot: Secondary | ICD-10-CM

## 2021-08-21 DIAGNOSIS — M810 Age-related osteoporosis without current pathological fracture: Secondary | ICD-10-CM | POA: Diagnosis not present

## 2021-08-21 DIAGNOSIS — Z8679 Personal history of other diseases of the circulatory system: Secondary | ICD-10-CM | POA: Diagnosis not present

## 2021-08-21 DIAGNOSIS — Z8719 Personal history of other diseases of the digestive system: Secondary | ICD-10-CM

## 2021-08-21 DIAGNOSIS — Z8614 Personal history of Methicillin resistant Staphylococcus aureus infection: Secondary | ICD-10-CM

## 2021-08-21 DIAGNOSIS — M359 Systemic involvement of connective tissue, unspecified: Secondary | ICD-10-CM

## 2021-08-21 DIAGNOSIS — M19041 Primary osteoarthritis, right hand: Secondary | ICD-10-CM | POA: Diagnosis not present

## 2021-08-21 DIAGNOSIS — Z8639 Personal history of other endocrine, nutritional and metabolic disease: Secondary | ICD-10-CM | POA: Diagnosis not present

## 2021-08-21 DIAGNOSIS — M79672 Pain in left foot: Secondary | ICD-10-CM

## 2021-08-21 MED ORDER — LIDOCAINE HCL 1 % IJ SOLN
0.5000 mL | INTRAMUSCULAR | Status: AC | PRN
  Administered 2021-08-21: .5 mL

## 2021-08-21 MED ORDER — TRIAMCINOLONE ACETONIDE 40 MG/ML IJ SUSP
20.0000 mg | INTRAMUSCULAR | Status: AC | PRN
  Administered 2021-08-21: 20 mg

## 2021-08-21 NOTE — Patient Instructions (Signed)
Plantar Fasciitis Rehab Ask your health care provider which exercises are safe for you. Do exercises exactly as told by your health care provider and adjust them as directed. It is normal to feel mild stretching, pulling, tightness, or discomfort as you do these exercises. Stop right away if you feel sudden pain or your pain gets worse. Do not begin these exercises until told by your health care provider. Stretching and range-of-motion exercises These exercises warm up your muscles and joints and improve the movement and flexibility of your foot. These exercises also help to relieve pain. Plantar fascia stretch  Sit with your left / right leg crossed over your opposite knee. Hold your heel with one hand with that thumb near your arch. With your other hand, hold your toes and gently pull them back toward the top of your foot. You should feel a stretch on the base (bottom) of your toes, or the bottom of your foot (plantar fascia), or both. Hold this stretch for__________ seconds. Slowly release your toes and return to the starting position. Repeat __________ times. Complete this exercise __________ times a day. Gastrocnemius stretch, standing This exercise is also called a calf (gastroc) stretch. It stretches the muscles in the back of the upper calf. Stand with your hands against a wall. Extend your left / right leg behind you, and bend your front knee slightly. Keeping your heels on the floor, your toes facing forward, and your back knee straight, shift your weight toward the wall. Do not arch your back. You should feel a gentle stretch in your upper calf. Hold this position for __________ seconds. Repeat __________ times. Complete this exercise __________ times a day. Soleus stretch, standing This exercise is also called a calf (soleus) stretch. It stretches the muscles in the back of the lower calf. Stand with your hands against a wall. Extend your left / right leg behind you, and bend your  front knee slightly. Keeping your heels on the floor and your toes facing forward, bend your back knee and shift your weight slightly over your back leg. You should feel a gentle stretch deep in your lower calf. Hold this position for __________ seconds. Repeat __________ times. Complete this exercise __________ times a day. Gastroc and soleus stretch, standing step This exercise stretches the muscles in the back of the lower leg. These muscles are in the upper calf (gastrocnemius) and the lower calf (soleus). Stand with the ball of your left / right foot on the front of a step. The ball of your foot is on the walking surface, right under your toes. Keep your other foot firmly on the same step. Hold on to the wall or a railing for balance. Slowly lift your other foot, allowing your body weight to press your heel down over the edge of the front of the step. Keep knee straight and unbent. You should feel a stretch in your calf. Hold this position for __________ seconds. Return both feet to the step. Repeat this exercise with a slight bend in your left / right knee. Repeat __________ times with your left / right knee straight and __________ times with your left / right knee bent. Complete this exercise __________ times a day. Balance exercise This exercise builds your balance and strength control of your arch to help take pressure off your plantar fascia. Single leg stand If this exercise is too easy, you can try it with your eyes closed or while standing on a pillow. Without shoes, stand near a  railing or in a doorway. You may hold on to the railing or door frame as needed. Stand on your left / right foot. Keep your big toe down on the floor and lift the arch of your foot. You should feel a stretch across the bottom of your foot and your arch. Do not let your foot roll inward. Hold this position for __________ seconds. Repeat __________ times. Complete this exercise __________ times a day. This  information is not intended to replace advice given to you by your health care provider. Make sure you discuss any questions you have with your health care provider. Document Revised: 04/24/2020 Document Reviewed: 04/24/2020 Elsevier Patient Education  [place].

## 2021-08-23 DIAGNOSIS — I1310 Hypertensive heart and chronic kidney disease without heart failure, with stage 1 through stage 4 chronic kidney disease, or unspecified chronic kidney disease: Secondary | ICD-10-CM | POA: Diagnosis not present

## 2021-08-23 DIAGNOSIS — N1831 Chronic kidney disease, stage 3a: Secondary | ICD-10-CM | POA: Diagnosis not present

## 2021-08-23 DIAGNOSIS — E785 Hyperlipidemia, unspecified: Secondary | ICD-10-CM | POA: Diagnosis not present

## 2021-08-23 DIAGNOSIS — I2510 Atherosclerotic heart disease of native coronary artery without angina pectoris: Secondary | ICD-10-CM | POA: Diagnosis not present

## 2021-08-31 LAB — URINALYSIS, ROUTINE W REFLEX MICROSCOPIC
Bacteria, UA: NONE SEEN /HPF
Bilirubin Urine: NEGATIVE
Glucose, UA: NEGATIVE
Hgb urine dipstick: NEGATIVE
Hyaline Cast: NONE SEEN /LPF
Ketones, ur: NEGATIVE
Nitrite: NEGATIVE
RBC / HPF: NONE SEEN /HPF (ref 0–2)
Specific Gravity, Urine: 1.015 (ref 1.001–1.035)
pH: 6 (ref 5.0–8.0)

## 2021-08-31 LAB — CBC WITH DIFFERENTIAL/PLATELET
Absolute Monocytes: 533 cells/uL (ref 200–950)
Basophils Absolute: 22 cells/uL (ref 0–200)
Basophils Relative: 0.5 %
Eosinophils Absolute: 60 cells/uL (ref 15–500)
Eosinophils Relative: 1.4 %
HCT: 41 % (ref 35.0–45.0)
Hemoglobin: 13.4 g/dL (ref 11.7–15.5)
Lymphs Abs: 1522 cells/uL (ref 850–3900)
MCH: 30.2 pg (ref 27.0–33.0)
MCHC: 32.7 g/dL (ref 32.0–36.0)
MCV: 92.3 fL (ref 80.0–100.0)
MPV: 11 fL (ref 7.5–12.5)
Monocytes Relative: 12.4 %
Neutro Abs: 2163 cells/uL (ref 1500–7800)
Neutrophils Relative %: 50.3 %
Platelets: 169 10*3/uL (ref 140–400)
RBC: 4.44 10*6/uL (ref 3.80–5.10)
RDW: 12.3 % (ref 11.0–15.0)
Total Lymphocyte: 35.4 %
WBC: 4.3 10*3/uL (ref 3.8–10.8)

## 2021-08-31 LAB — COMPLETE METABOLIC PANEL WITH GFR
AG Ratio: 1 (calc) (ref 1.0–2.5)
ALT: 16 U/L (ref 6–29)
AST: 32 U/L (ref 10–35)
Albumin: 3.5 g/dL — ABNORMAL LOW (ref 3.6–5.1)
Alkaline phosphatase (APISO): 80 U/L (ref 37–153)
BUN: 20 mg/dL (ref 7–25)
CO2: 29 mmol/L (ref 20–32)
Calcium: 10.4 mg/dL (ref 8.6–10.4)
Chloride: 104 mmol/L (ref 98–110)
Creat: 0.83 mg/dL (ref 0.60–0.95)
Globulin: 3.6 g/dL (calc) (ref 1.9–3.7)
Glucose, Bld: 91 mg/dL (ref 65–99)
Potassium: 4.5 mmol/L (ref 3.5–5.3)
Sodium: 138 mmol/L (ref 135–146)
Total Bilirubin: 0.4 mg/dL (ref 0.2–1.2)
Total Protein: 7.1 g/dL (ref 6.1–8.1)
eGFR: 71 mL/min/{1.73_m2} (ref >=60)

## 2021-08-31 LAB — PROTEIN ELECTROPHORESIS, SERUM, WITH REFLEX
Albumin ELP: 3.7 g/dL — ABNORMAL LOW (ref 3.8–4.8)
Alpha 1: 0.3 g/dL (ref 0.2–0.3)
Alpha 2: 0.7 g/dL (ref 0.5–0.9)
Beta 2: 0.6 g/dL — ABNORMAL HIGH (ref 0.2–0.5)
Beta Globulin: 0.5 g/dL (ref 0.4–0.6)
Gamma Globulin: 1.6 g/dL (ref 0.8–1.7)
Total Protein: 7.4 g/dL (ref 6.1–8.1)

## 2021-08-31 LAB — RHEUMATOID FACTOR: Rheumatoid fact SerPl-aCnc: <14 IU/mL (ref <14)

## 2021-08-31 LAB — MICROSCOPIC MESSAGE

## 2021-08-31 LAB — TEST AUTHORIZATION

## 2021-08-31 LAB — PROTEIN / CREATININE RATIO, URINE

## 2021-08-31 LAB — ANTI-DNA ANTIBODY, DOUBLE-STRANDED: ds DNA Ab: 1 IU/mL

## 2021-08-31 LAB — SEDIMENTATION RATE: Sed Rate: 25 mm/h (ref 0–30)

## 2021-08-31 LAB — C3 AND C4
C3 Complement: 128 mg/dL
C4 Complement: 24 mg/dL

## 2021-09-22 DIAGNOSIS — E785 Hyperlipidemia, unspecified: Secondary | ICD-10-CM | POA: Diagnosis not present

## 2021-09-22 DIAGNOSIS — I1310 Hypertensive heart and chronic kidney disease without heart failure, with stage 1 through stage 4 chronic kidney disease, or unspecified chronic kidney disease: Secondary | ICD-10-CM | POA: Diagnosis not present

## 2021-09-22 DIAGNOSIS — N1831 Chronic kidney disease, stage 3a: Secondary | ICD-10-CM | POA: Diagnosis not present

## 2021-09-22 DIAGNOSIS — I2510 Atherosclerotic heart disease of native coronary artery without angina pectoris: Secondary | ICD-10-CM | POA: Diagnosis not present

## 2021-10-09 ENCOUNTER — Other Ambulatory Visit: Payer: Self-pay | Admitting: Gastroenterology

## 2021-10-28 ENCOUNTER — Other Ambulatory Visit: Payer: Self-pay | Admitting: Cardiology

## 2021-10-29 DIAGNOSIS — L739 Follicular disorder, unspecified: Secondary | ICD-10-CM | POA: Diagnosis not present

## 2021-11-18 DIAGNOSIS — M79672 Pain in left foot: Secondary | ICD-10-CM | POA: Diagnosis not present

## 2021-11-22 DIAGNOSIS — N1831 Chronic kidney disease, stage 3a: Secondary | ICD-10-CM | POA: Diagnosis not present

## 2021-11-22 DIAGNOSIS — I1310 Hypertensive heart and chronic kidney disease without heart failure, with stage 1 through stage 4 chronic kidney disease, or unspecified chronic kidney disease: Secondary | ICD-10-CM | POA: Diagnosis not present

## 2021-11-22 DIAGNOSIS — E785 Hyperlipidemia, unspecified: Secondary | ICD-10-CM | POA: Diagnosis not present

## 2021-11-22 DIAGNOSIS — I2510 Atherosclerotic heart disease of native coronary artery without angina pectoris: Secondary | ICD-10-CM | POA: Diagnosis not present

## 2021-11-27 DIAGNOSIS — M25471 Effusion, right ankle: Secondary | ICD-10-CM | POA: Diagnosis not present

## 2021-11-27 DIAGNOSIS — M79672 Pain in left foot: Secondary | ICD-10-CM | POA: Diagnosis not present

## 2021-11-27 DIAGNOSIS — M79671 Pain in right foot: Secondary | ICD-10-CM | POA: Diagnosis not present

## 2021-11-27 DIAGNOSIS — S92012A Displaced fracture of body of left calcaneus, initial encounter for closed fracture: Secondary | ICD-10-CM | POA: Diagnosis not present

## 2021-12-10 DIAGNOSIS — M810 Age-related osteoporosis without current pathological fracture: Secondary | ICD-10-CM | POA: Diagnosis not present

## 2021-12-10 DIAGNOSIS — I7300 Raynaud's syndrome without gangrene: Secondary | ICD-10-CM | POA: Diagnosis not present

## 2021-12-10 DIAGNOSIS — N390 Urinary tract infection, site not specified: Secondary | ICD-10-CM | POA: Diagnosis not present

## 2021-12-10 DIAGNOSIS — I252 Old myocardial infarction: Secondary | ICD-10-CM | POA: Diagnosis not present

## 2021-12-10 DIAGNOSIS — N1831 Chronic kidney disease, stage 3a: Secondary | ICD-10-CM | POA: Diagnosis not present

## 2021-12-10 DIAGNOSIS — D696 Thrombocytopenia, unspecified: Secondary | ICD-10-CM | POA: Diagnosis not present

## 2021-12-10 DIAGNOSIS — E039 Hypothyroidism, unspecified: Secondary | ICD-10-CM | POA: Diagnosis not present

## 2021-12-10 DIAGNOSIS — M3210 Systemic lupus erythematosus, organ or system involvement unspecified: Secondary | ICD-10-CM | POA: Diagnosis not present

## 2021-12-10 DIAGNOSIS — E785 Hyperlipidemia, unspecified: Secondary | ICD-10-CM | POA: Diagnosis not present

## 2021-12-10 DIAGNOSIS — D899 Disorder involving the immune mechanism, unspecified: Secondary | ICD-10-CM | POA: Diagnosis not present

## 2021-12-10 DIAGNOSIS — I480 Paroxysmal atrial fibrillation: Secondary | ICD-10-CM | POA: Diagnosis not present

## 2021-12-10 DIAGNOSIS — I1310 Hypertensive heart and chronic kidney disease without heart failure, with stage 1 through stage 4 chronic kidney disease, or unspecified chronic kidney disease: Secondary | ICD-10-CM | POA: Diagnosis not present

## 2021-12-10 DIAGNOSIS — F418 Other specified anxiety disorders: Secondary | ICD-10-CM | POA: Diagnosis not present

## 2021-12-10 DIAGNOSIS — M797 Fibromyalgia: Secondary | ICD-10-CM | POA: Diagnosis not present

## 2021-12-13 ENCOUNTER — Other Ambulatory Visit: Payer: Self-pay | Admitting: Physician Assistant

## 2021-12-13 DIAGNOSIS — Z79899 Other long term (current) drug therapy: Secondary | ICD-10-CM

## 2021-12-13 DIAGNOSIS — M359 Systemic involvement of connective tissue, unspecified: Secondary | ICD-10-CM

## 2021-12-14 NOTE — Telephone Encounter (Signed)
Next Visit: 01/19/2022  Last Visit: 08/21/2021  Labs: 08/21/2021 CBC WNL. Albumin is borderline low-3.5. rest of CMP WNL.  Eye exam: 12/30/2020 WNL   Current Dose per office note 08/21/2021: Plaquenil 200 mg 1 tablet by mouth BID Monday through Friday  DX: Autoimmune disease   Last Fill: 07/15/2021  Okay to refill Plaquenil?

## 2021-12-25 DIAGNOSIS — S92015D Nondisplaced fracture of body of left calcaneus, subsequent encounter for fracture with routine healing: Secondary | ICD-10-CM | POA: Diagnosis not present

## 2021-12-25 DIAGNOSIS — S92012A Displaced fracture of body of left calcaneus, initial encounter for closed fracture: Secondary | ICD-10-CM | POA: Diagnosis not present

## 2021-12-27 ENCOUNTER — Other Ambulatory Visit: Payer: Self-pay | Admitting: Cardiology

## 2022-01-08 NOTE — Progress Notes (Unsigned)
Office Visit Note  Patient: Alice Medina             Date of Birth: 03-24-1941           MRN: [id number]             PCP: Shon Baton, MD Referring: Shon Baton, MD Visit Date: 01/19/2022 Occupation: '@GUAROCC'$ @  Subjective:  No chief complaint on file.   History of Present Illness: Alice Medina is a 81 y.o. female ***   Activities of Daily Living:  Patient reports morning stiffness for *** {minute/hour:19697}.   Patient {ACTIONS;DENIES/REPORTS:21021675::"Denies"} nocturnal pain.  Difficulty dressing/grooming: {ACTIONS;DENIES/REPORTS:21021675::"Denies"} Difficulty climbing stairs: {ACTIONS;DENIES/REPORTS:21021675::"Denies"} Difficulty getting out of chair: {ACTIONS;DENIES/REPORTS:21021675::"Denies"} Difficulty using hands for taps, buttons, cutlery, and/or writing: {ACTIONS;DENIES/REPORTS:21021675::"Denies"}  No Rheumatology ROS completed.   PMFS History:  Patient Active Problem List   Diagnosis Date Noted   Paroxysmal atrial fibrillation ([place]) 04/07/2020   Secondary hypercoagulable state ([place]) 04/07/2020   Multiple closed fractures of metacarpal bone 07/25/2017   Autoimmune disease ([place]) +ANA +[place] +RF oral ulcers nasal ulcers Raynaud photosensitivity SICCA  01/13/2017   High risk medication use 01/13/2017   Photosensitivity 01/13/2017   Sjogren's syndrome with keratoconjunctivitis sicca ([place]) 01/13/2017   Recurrent oral ulcers and nasal ulcers  01/13/2017   Raynaud's disease without gangrene 01/13/2017   Primary osteoarthritis of both hands 01/13/2017   Primary osteoarthritis of both knees 01/13/2017   Primary osteoarthritis of both feet 01/13/2017   Osteopenia of multiple sites 01/13/2017   History of hypertension 01/13/2017   History of hypothyroidism 01/13/2017   History of gastroesophageal reflux (GERD)/ PUD 01/13/2017   History of MRSA infection 01/13/2017   Diarrhea 05/28/2013   Family history of malignant neoplasm of gastrointestinal tract 06/22/2011    ADENOCARCINOMA, BREAST 05/02/2009   CORONARY ARTERY DISEASE 05/02/2009   DYSPHAGIA UNSPECIFIED 05/02/2009    Past Medical History:  Diagnosis Date   ADENOCARCINOMA, BREAST 05/02/2009   Qualifier: Diagnosis of  By: Deatra Ina MD, Sandy Salaam    Anxiety    Arthritis    Autoimmune disorder ([place])    SJORGREN'S   Breast cancer ([place])    right breast   Chronic headaches    CORONARY ARTERY DISEASE    cath in 1990 (normal Left main, normal CFX, normal RCA, myocardial bridge in LAD per Dr. Thurman Coyer note)   Depression    Diarrhea 05/28/2013   DYSPHAGIA UNSPECIFIED 05/02/2009   Qualifier: Diagnosis of  By: Deatra Ina MD, Sandy Salaam    Family history of malignant neoplasm of gastrointestinal tract 06/22/2011   Brother with colon cancer    GERD (gastroesophageal reflux disease)    Hemorrhage of rectum and anus 06/22/2011   History of hypothyroidism 01/13/2017   History of MRSA infection 01/13/2017   Hypertension    Multiple closed fractures of metacarpal bone 07/25/2017   Myocardial bridge    Myocardial infarction [facility])    1990   Neuromuscular disorder ([place])    LUPUS   Osteopenia of multiple sites 01/13/2017   Osteoporosis    PAC (premature atrial contraction) 04/07/2020   PAF (paroxysmal atrial fibrillation) ([facility])    Photosensitivity 01/13/2017   Primary osteoarthritis of both hands 01/13/2017   Raynaud's disease without gangrene 01/13/2017   Recurrent oral ulcers and nasal ulcers  01/13/2017   Sjogren's syndrome with keratoconjunctivitis sicca ([place]) 01/13/2017   Status post dilation of esophageal narrowing    Thyroid disease    Ulcer     Family History  Problem Relation Age of Onset  Breast cancer Mother    Lung cancer Mother    Cancer Father        larynx   Heart failure Father    Heart disease Brother    Colon cancer Brother    Pseudochol deficiency Other    Breast cancer Sister    Cancer Sister        thyroid    Breast cancer Maternal Grandmother    Autoimmune disease  Daughter    Cancer Son        prostate    Diabetes Son    Rheum arthritis Grandchild    Esophageal cancer Neg Hx    Rectal cancer Neg Hx    Stomach cancer Neg Hx    Past Surgical History:  Procedure Laterality Date   APPENDECTOMY     breast reconstuction     x 2   CHOLECYSTECTOMY     COLONOSCOPY     double mastectomy     PARTIAL HYSTERECTOMY  1990   TIBIA FRACTURE SURGERY Right    with subsequent hardware removal   UPPER GASTROINTESTINAL ENDOSCOPY     WRIST FRACTURE SURGERY Left    Social History   Social History Narrative   1 cup of coffee daily   Immunization History  Administered Date(s) Administered   Influenza Split 05/07/2010, 05/14/2011, 05/12/2012, 04/19/2013, 04/15/2014   Influenza, Quadrivalent, Recombinant, Inj, Pf 04/22/2018, 04/07/2019, 05/10/2020   Influenza,inj,Quad PF,6+ Mos 04/15/2014, 05/19/2015   Influenza-Unspecified 04/24/2016, 04/23/2017   PFIZER(Purple Top)SARS-COV-2 Vaccination 09/03/2019, 09/28/2019, 06/27/2020   Pneumococcal Conjugate-13 11/20/2013, 12/24/2014   Pneumococcal Polysaccharide-23 09/08/2007, 01/06/2018   Td,absorbed, Preservative Free, Adult Use, Lf Unspecified 04/13/2011   Tdap 10/29/2009   Unspecified SARS-COV-2 Vaccination 09/03/2019, 09/28/2019   Zoster Recombinat (Shingrix) 01/10/2018, 05/11/2018, 09/19/2018   Zoster, Live 11/20/2013, 01/13/2018, 05/11/2018     Objective: Vital Signs: There were no vitals taken for this visit.   Physical Exam   Musculoskeletal Exam: ***  CDAI Exam: CDAI Score: -- Patient Global: --; Provider Global: -- Swollen: --; Tender: -- Joint Exam 01/19/2022   No joint exam has been documented for this visit   There is currently no information documented on the homunculus. Go to the Rheumatology activity and complete the homunculus joint exam.  Investigation: No additional findings.  Imaging: No results found.  Recent Labs: Lab Results  Component Value Date   WBC 4.3 08/21/2021    HGB 13.4 08/21/2021   PLT 169 08/21/2021   NA 138 08/21/2021   K 4.5 08/21/2021   CL 104 08/21/2021   CO2 29 08/21/2021   GLUCOSE 91 08/21/2021   BUN 20 08/21/2021   CREATININE 0.83 08/21/2021   BILITOT 0.4 08/21/2021   ALKPHOS 79 12/13/2017   AST 32 08/21/2021   ALT 16 08/21/2021   PROT 7.1 08/21/2021   PROT 7.4 08/21/2021   ALBUMIN 3.6 12/13/2017   CALCIUM 10.4 08/21/2021   GFRAA 81 10/16/2020    Speciality Comments: PLQ Eye Exam: 12/30/2020 WNL @ [facility] [place]. 6 months  Procedures:  No procedures performed Allergies: Codeine, Robinul [glycopyrrolate], and Sulfamethoxazole-trimethoprim   Assessment / Plan:     Visit Diagnoses: Autoimmune disease ([place])  Sjogren's syndrome with keratoconjunctivitis sicca (H[facility])  High risk medication use  Raynaud's disease without gangrene  Primary osteoarthritis of both hands  Primary osteoarthritis of both knees  Primary osteoarthritis of both feet  Age-related osteoporosis without current pathological fracture  History of hypertension  History of atrial fibrillation  History of gastroesophageal reflux (GERD)/ PUD  History of hypothyroidism  History of MRSA infection  Orders: No orders of the defined types were placed in this encounter.  No orders of the defined types were placed in this encounter.   Face-to-face time spent with patient was *** minutes. Greater than 50% of time was spent in counseling and coordination of care.  Follow-Up Instructions: No follow-ups on file.   Ofilia Neas, PA-C  Note - This record has been created using Dragon software.  Chart creation errors have been sought, but may not always  have been located. Such creation errors do not reflect on  the standard of medical care.

## 2022-01-13 ENCOUNTER — Other Ambulatory Visit: Payer: Self-pay | Admitting: Gastroenterology

## 2022-01-19 ENCOUNTER — Ambulatory Visit: Payer: PPO | Admitting: Physician Assistant

## 2022-01-19 ENCOUNTER — Encounter: Payer: Self-pay | Admitting: Physician Assistant

## 2022-01-19 ENCOUNTER — Telehealth: Payer: Self-pay | Admitting: *Deleted

## 2022-01-19 VITALS — BP 133/75 | HR 59 | Ht 64.5 in | Wt 143.0 lb

## 2022-01-19 DIAGNOSIS — Z8614 Personal history of Methicillin resistant Staphylococcus aureus infection: Secondary | ICD-10-CM

## 2022-01-19 DIAGNOSIS — M19042 Primary osteoarthritis, left hand: Secondary | ICD-10-CM

## 2022-01-19 DIAGNOSIS — M170 Bilateral primary osteoarthritis of knee: Secondary | ICD-10-CM

## 2022-01-19 DIAGNOSIS — M3501 Sicca syndrome with keratoconjunctivitis: Secondary | ICD-10-CM

## 2022-01-19 DIAGNOSIS — Z8719 Personal history of other diseases of the digestive system: Secondary | ICD-10-CM

## 2022-01-19 DIAGNOSIS — E559 Vitamin D deficiency, unspecified: Secondary | ICD-10-CM | POA: Diagnosis not present

## 2022-01-19 DIAGNOSIS — Z79899 Other long term (current) drug therapy: Secondary | ICD-10-CM | POA: Diagnosis not present

## 2022-01-19 DIAGNOSIS — Z8781 Personal history of (healed) traumatic fracture: Secondary | ICD-10-CM

## 2022-01-19 DIAGNOSIS — I7300 Raynaud's syndrome without gangrene: Secondary | ICD-10-CM | POA: Diagnosis not present

## 2022-01-19 DIAGNOSIS — M19071 Primary osteoarthritis, right ankle and foot: Secondary | ICD-10-CM

## 2022-01-19 DIAGNOSIS — M19041 Primary osteoarthritis, right hand: Secondary | ICD-10-CM | POA: Diagnosis not present

## 2022-01-19 DIAGNOSIS — Z8639 Personal history of other endocrine, nutritional and metabolic disease: Secondary | ICD-10-CM

## 2022-01-19 DIAGNOSIS — M359 Systemic involvement of connective tissue, unspecified: Secondary | ICD-10-CM

## 2022-01-19 DIAGNOSIS — Z8679 Personal history of other diseases of the circulatory system: Secondary | ICD-10-CM | POA: Diagnosis not present

## 2022-01-19 DIAGNOSIS — Z5181 Encounter for therapeutic drug level monitoring: Secondary | ICD-10-CM | POA: Diagnosis not present

## 2022-01-19 DIAGNOSIS — M810 Age-related osteoporosis without current pathological fracture: Secondary | ICD-10-CM | POA: Diagnosis not present

## 2022-01-19 DIAGNOSIS — M19072 Primary osteoarthritis, left ankle and foot: Secondary | ICD-10-CM

## 2022-01-19 MED ORDER — HYDROXYCHLOROQUINE SULFATE 200 MG PO TABS: ORAL_TABLET | ORAL | 0 refills | Status: DC

## 2022-01-19 NOTE — Telephone Encounter (Signed)
Received DEXA results from [facility].  Date of Scan: 12/15/2020  Lowest T-score:-3.3  BMD:0.498  Lowest site measured:1/3 Right Forearm  DX: Osteoporosis  Significant changes in BMD and site measured (5% and above):n/a  Current Regimen:Vitamin D and Evista 60 mg three times weekly.   Recommendation:Discussed at follow up visit on 01/19/2022  Reviewed by: Dr. Pollyann Savoy

## 2022-01-20 NOTE — Telephone Encounter (Signed)
Inbound call from patient pharmacy is requesting prior authorization for omeprazole medication. Please give patient a call back to advise.  Thank you

## 2022-01-21 LAB — CBC WITH DIFFERENTIAL/PLATELET
Absolute Monocytes: 525 cells/uL (ref 200–950)
Basophils Absolute: 22 cells/uL (ref 0–200)
Basophils Relative: 0.5 %
Eosinophils Absolute: 99 cells/uL (ref 15–500)
Eosinophils Relative: 2.3 %
HCT: 39.3 % (ref 35.0–45.0)
Hemoglobin: 12.7 g/dL (ref 11.7–15.5)
Lymphs Abs: 1479 cells/uL (ref 850–3900)
MCH: 30.6 pg (ref 27.0–33.0)
MCHC: 32.3 g/dL (ref 32.0–36.0)
MCV: 94.7 fL (ref 80.0–100.0)
MPV: 10.5 fL (ref 7.5–12.5)
Monocytes Relative: 12.2 %
Neutro Abs: 2176 cells/uL (ref 1500–7800)
Neutrophils Relative %: 50.6 %
Platelets: 182 10*3/uL (ref 140–400)
RBC: 4.15 10*6/uL (ref 3.80–5.10)
RDW: 12.5 % (ref 11.0–15.0)
Total Lymphocyte: 34.4 %
WBC: 4.3 10*3/uL (ref 3.8–10.8)

## 2022-01-21 LAB — COMPLETE METABOLIC PANEL WITH GFR
AG Ratio: 1.1 (calc) (ref 1.0–2.5)
ALT: 15 U/L (ref 6–29)
AST: 27 U/L (ref 10–35)
Albumin: 3.8 g/dL (ref 3.6–5.1)
Alkaline phosphatase (APISO): 107 U/L (ref 37–153)
BUN: 20 mg/dL (ref 7–25)
CO2: 27 mmol/L (ref 20–32)
Calcium: 10.6 mg/dL — ABNORMAL HIGH (ref 8.6–10.4)
Chloride: 106 mmol/L (ref 98–110)
Creat: 0.76 mg/dL (ref 0.60–0.95)
Globulin: 3.5 g/dL (calc) (ref 1.9–3.7)
Glucose, Bld: 69 mg/dL (ref 65–99)
Potassium: 4.1 mmol/L (ref 3.5–5.3)
Sodium: 140 mmol/L (ref 135–146)
Total Bilirubin: 0.4 mg/dL (ref 0.2–1.2)
Total Protein: 7.3 g/dL (ref 6.1–8.1)
eGFR: 79 mL/min/{1.73_m2} (ref >=60)

## 2022-01-21 LAB — ANTI-NUCLEAR AB-TITER (ANA TITER): ANA Titer 1: 1:640 {titer} — ABNORMAL HIGH

## 2022-01-21 LAB — C3 AND C4
C3 Complement: 135 mg/dL
C4 Complement: 25 mg/dL

## 2022-01-21 LAB — SEDIMENTATION RATE: Sed Rate: 38 mm/h — ABNORMAL HIGH (ref 0–30)

## 2022-01-21 LAB — URINALYSIS, ROUTINE W REFLEX MICROSCOPIC
Bilirubin Urine: NEGATIVE
Glucose, UA: NEGATIVE
Hgb urine dipstick: NEGATIVE
Ketones, ur: NEGATIVE
Leukocytes,Ua: NEGATIVE
Nitrite: NEGATIVE
Protein, ur: NEGATIVE
Specific Gravity, Urine: 1.025 (ref 1.001–1.035)
pH: 5.5 (ref 5.0–8.0)

## 2022-01-21 LAB — ANA: Anti Nuclear Antibody (ANA): POSITIVE — AB

## 2022-01-21 LAB — ANTI-DNA ANTIBODY, DOUBLE-STRANDED: ds DNA Ab: 1 IU/mL

## 2022-01-21 LAB — MAGNESIUM: Magnesium: 1.9 mg/dL (ref 1.5–2.5)

## 2022-01-21 LAB — VITAMIN D 25 HYDROXY (VIT D DEFICIENCY, FRACTURES): Vit D, 25-Hydroxy: 69 ng/mL (ref 30–100)

## 2022-01-21 NOTE — Progress Notes (Signed)
CBC WNL. Calcium is elevated-10.6.  rest of CMP WNL. Patient has a calcaneus fracture.  It is likely her calcium is elevated due to taking more vitamin D after having the fracture.  Please stop calcium supplement.  She can get calcium in diet.   ESR is elevated-38. dsDNA negative. Complements WNL.  Vitamin D WNL.   Magnesium WNL.  UA normal.

## 2022-01-22 DIAGNOSIS — M79672 Pain in left foot: Secondary | ICD-10-CM | POA: Diagnosis not present

## 2022-01-22 DIAGNOSIS — S92015D Nondisplaced fracture of body of left calcaneus, subsequent encounter for fracture with routine healing: Secondary | ICD-10-CM | POA: Diagnosis not present

## 2022-01-22 NOTE — Progress Notes (Signed)
ANA is positive. No medication changes recommended at this time.

## 2022-01-25 ENCOUNTER — Other Ambulatory Visit: Payer: Self-pay

## 2022-01-25 MED ORDER — OMEPRAZOLE 20 MG PO CPDR: DELAYED_RELEASE_CAPSULE | ORAL | 0 refills | Status: DC

## 2022-01-25 NOTE — Telephone Encounter (Signed)
30 days supply only. Patient needs a follow up for additional refills.

## 2022-02-01 NOTE — Progress Notes (Unsigned)
Cardiology Office Note    Date:  02/03/2022   ID:  Alice Medina, DOB Aug 06, 1940, MRN [id number]  PCP:  Shon Baton, MD  Cardiologist:  Candee Furbish, MD  Electrophysiologist:  None   Chief Complaint: f/u afib, HTN  History of Present Illness:   Alice Medina is a 81 y.o. female with history of PAF, CAD (myocardial bridging), autoimmune disease followed by rheumatology, HTN, HLD, breast CA, anxiety, dilation of esophageal narrowing who presents for routine follow-up. She is a former patient of Dr. Thurman Coyer then established with Dr. Marlou Porch. Prior notes outline history of CAD with myocardial bridging by cath in 1990 (normal Left main, normal CFX, normal RCA, myocardial bridge in LAD per Dr. Thurman Coyer note). She has been maintained on sotalol for her atrial fibrillation and has previously followed with AFib clinic as well. 2d echo 03/2020 EF 65-70%, grade 1 DD, normal PA pressure. No clinical hx of CHF. Historically she has declined anticoagulation in favor of aspirin, but was agreeable to starting Eliquis last year since her sister was on it and has done well.  She is seen for follow-up today overall feeling great from a cardiac standpoint. She is tolerating her medicine regimen well without adverse effects. No chest pain or SOB. She has very rare breakthrough palpitations that are short lived - afib overall well controlled. She does note episodic elevated BPs at home. She used to be on spironolactone in the past but it got discontinued when she had a tendency for low BP. She was on this for chronic mild edema before. She brings in a log of pressures ranging from 638-466 systolic range. She had a spontaneous closed fracture of her calcaneus (without any fall) that she is feeling from. She has been released from her boot. She also notes that when she went to fill her magnesium last time, the bottle said once daily instead of twice daily so that's how she's been taking it - though we called the  pharmacy and they confirmed this was an error.   Labwork independently reviewed: 12/2021 Mg 1.9, CBC wnl, K 4.1, Cr 0.76, Ca 10.6, LFTs ok KPN 05/2021 Tchol 131, HDL 48, LDL 69, trig 69 12/2020 TSH Wnl 2021 scan LDL 70, trig 54   Cardiology Studies:   Studies reviewed are outlined and summarized above. Reports included below if pertinent.   2D echo 03/2020    1. Left ventricular ejection fraction, by estimation, is 65 to 70%. The  left ventricle has normal function. The left ventricle has no regional  wall motion abnormalities. There is moderate concentric left ventricular  hypertrophy. Left ventricular  diastolic parameters are consistent with Grade I diastolic dysfunction  (impaired relaxation).   2. Right ventricular systolic function is normal. The right ventricular  size is normal. There is normal pulmonary artery systolic pressure. The  estimated right ventricular systolic pressure is 59.9 mmHg.   3. The mitral valve is normal in structure. No evidence of mitral valve  regurgitation. No evidence of mitral stenosis.   4. The aortic valve is normal in structure. Aortic valve regurgitation is  mild. No aortic stenosis is present.   5. The inferior vena cava is normal in size with greater than 50%  respiratory variability, suggesting right atrial pressure of 3 mmHg.     Past Medical History:  Diagnosis Date   ADENOCARCINOMA, BREAST 05/02/2009   Qualifier: Diagnosis of  By: Deatra Ina MD, Sandy Salaam    Anxiety    Arthritis    Autoimmune  disorder ([place])    SJORGREN'S   Breast cancer ([place])    right breast   Chronic headaches    CORONARY ARTERY DISEASE    cath in 1990 (normal Left main, normal CFX, normal RCA, myocardial bridge in LAD per Dr. Thurman Coyer note)   Depression    Diarrhea 05/28/2013   DYSPHAGIA UNSPECIFIED 05/02/2009   Qualifier: Diagnosis of  By: Deatra Ina MD, Sandy Salaam    Family history of malignant neoplasm of gastrointestinal tract 06/22/2011   Brother with colon  cancer    GERD (gastroesophageal reflux disease)    Hemorrhage of rectum and anus 06/22/2011   History of hypothyroidism 01/13/2017   History of MRSA infection 01/13/2017   Hypertension    Multiple closed fractures of metacarpal bone 07/25/2017   Myocardial bridge    Myocardial infarction [facility])    1990   Neuromuscular disorder ([place])    LUPUS   Osteopenia of multiple sites 01/13/2017   Osteoporosis    PAC (premature atrial contraction) 04/07/2020   PAF (paroxysmal atrial fibrillation) (HCC)    Photosensitivity 01/13/2017   Primary osteoarthritis of both hands 01/13/2017   Raynaud's disease without gangrene 01/13/2017   Recurrent oral ulcers and nasal ulcers  01/13/2017   Sjogren's syndrome with keratoconjunctivitis sicca ([place]) 01/13/2017   Status post dilation of esophageal narrowing    Thyroid disease    Ulcer     Past Surgical History:  Procedure Laterality Date   APPENDECTOMY     breast reconstuction     x 2   CHOLECYSTECTOMY     COLONOSCOPY     double mastectomy     PARTIAL HYSTERECTOMY  1990   TIBIA FRACTURE SURGERY Right    with subsequent hardware removal   UPPER GASTROINTESTINAL ENDOSCOPY     WRIST FRACTURE SURGERY Left     Current Medications: Current Meds  Medication Sig   amLODipine (NORVASC) 2.5 MG tablet TAKE 1 TABLET(2.5 MG) BY MOUTH DAILY   apixaban (ELIQUIS) 5 MG TABS tablet Take 1 tablet (5 mg total) by mouth 2 (two) times daily.   Budesonide-Formoterol Fumarate (SYMBICORT IN) Inhale into the lungs as needed.    Cholecalciferol (VITAMIN D3 PO) Take 4,000 Units by mouth daily at 12 noon.   cyclobenzaprine (FLEXERIL) 10 MG tablet Take 10 mg by mouth at bedtime as needed for muscle spasms.   cycloSPORINE (RESTASIS) 0.05 % ophthalmic emulsion Place 1 drop into both eyes 2 (two) times daily.   diclofenac Sodium (VOLTAREN) 1 % GEL Apply 2-4 grams to affected joint 4 times daily as needed.   DULoxetine (CYMBALTA) 60 MG capsule Take 60 mg by mouth daily.    hydroxychloroquine (PLAQUENIL) 200 MG tablet TAKE 1 TABLET BY MOUTH TWICE DAILY( EVERY 12 HOURS) ON MONDAY THROUGH FRIDAY ONLY.   levothyroxine (SYNTHROID, LEVOTHROID) 50 MCG tablet Take 50 mcg by mouth daily.   LORazepam (ATIVAN) 2 MG tablet Take 2 mg by mouth at bedtime.   losartan (COZAAR) 100 MG tablet TAKE 1 TABLET(100 MG) BY MOUTH DAILY   magnesium oxide (MAG-OX) 400 MG tablet Take 1 tablet (400 mg total) by mouth 2 (two) times daily.   Multiple Vitamins-Minerals (MULTIVITAMIN WITH MINERALS) tablet Take 1 tablet by mouth daily.   omeprazole (PRILOSEC) 20 MG capsule One by mouth daily.  NEEDS A FOLLOW UP FOR ADDITIONAL REFILLS   sotalol (BETAPACE) 80 MG tablet TAKE 1 TABLET BY MOUTH TWICE DAILY.Please call and schedule an appointment for further refills 1st attempt      Allergies:  Codeine, Robinul [glycopyrrolate], and Sulfamethoxazole-trimethoprim   Social History   Socioeconomic History   Marital status: Widowed    Spouse name: Not on file   Number of children: 2   Years of education: Not on file   Highest education level: Not on file  Occupational History   Occupation: retired Corporate treasurer  Tobacco Use   Smoking status: Never   Smokeless tobacco: Never  Vaping Use   Vaping Use: Never used  Substance and Sexual Activity   Alcohol use: Not Currently   Drug use: Never   Sexual activity: Not on file  Other Topics Concern   Not on file  Social History Narrative   1 cup of coffee daily   Social Determinants of Health   Financial Resource Strain: Not on file  Food Insecurity: Not on file  Transportation Needs: Not on file  Physical Activity: Not on file  Stress: Not on file  Social Connections: Not on file     Family History:  The patient's family history includes Autoimmune disease in her daughter; Breast cancer in her maternal grandmother, mother, and sister; Cancer in her father, sister, and son; Colon cancer in her brother; Diabetes in her son; Heart disease in her  brother; Heart failure in her father; Lung cancer in her mother; Pseudochol deficiency in an other family member; Rheum arthritis in her grandchild. There is no history of Esophageal cancer, Rectal cancer, or Stomach cancer.  ROS:   Please see the history of present illness.  All other systems are reviewed and otherwise negative.    EKG(s)/Additional Labs   EKG:  EKG is ordered today, personally reviewed, demonstrating NSR 63bpm, first degree AVB, occaisonal PACs, nonspecific STTW changes   Recent Labs: 01/19/2022: ALT 15; BUN 20; Creat 0.76; Hemoglobin 12.7; Magnesium 1.9; Platelets 182; Potassium 4.1; Sodium 140  Recent Lipid Panel No results found for: "CHOL", "TRIG", "HDL", "CHOLHDL", "VLDL", "LDLCALC", "LDLDIRECT"  PHYSICAL EXAM:    VS:  BP (!) 146/86   Pulse 63   Ht 5' 4.5" (1.638 m)   Wt 144 lb 12.8 oz (65.7 kg)   SpO2 97%   BMI 24.47 kg/m   BMI: Body mass index is 24.47 kg/m.  GEN: Well nourished, well developed female in no acute distress HEENT: normocephalic, atraumatic Neck: no JVD, carotid bruits, or masses Cardiac: RRR; no murmurs, rubs, or gallops, no edema  Respiratory:  clear to auscultation bilaterally, normal work of breathing GI: soft, nontender, nondistended, + BS MS: no deformity or atrophy Skin: warm and dry, no rash Neuro:  Alert and Oriented x 3, Strength and sensation are intact, follows commands Psych: euthymic mood, full affect  Wt Readings from Last 3 Encounters:  02/03/22 144 lb 12.8 oz (65.7 kg)  01/19/22 143 lb (64.9 kg)  08/21/21 144 lb 12.8 oz (65.7 kg)     ASSESSMENT & PLAN:   1. PAF - largely quiescent on sotalol '80mg'$  BID, QTC stable, feeling well. She is tolerating Eliquis and dose remains appropriate. Will continue alternating follow-up between here and the afib clinic as this seems to be working well for her (afib 6 months, [facility] 1 year). Given recent question of magnesium dosing as well as hypercalcemia, recheck BMET/Mg today.  Her pharmacy said they would be able to refill the rx to make the correction to twice daily dosing as previously intended if needed.  2. Coronary artery disease by way of intramyocardial bridge - no recent anginal symptoms. Continue current regimen.  3. Essential HTN - BP  uptrending over several month's time including back to January. Check thyroid with labs above. If calcium improved and BMET otherwise stable, anticipate re-initiation of spironolactone '25mg'$  daily with f/u nurse visit with BMET in 1 week.  4. Hyperlipidemia - lipids followed in primary care, last LDL 69, not presently on any therapy.    Disposition: F/u nurse BP check in 1 week (anticipate adding spironolactone once labs return), afib clinic in 6 months, and me or Dr. Marlou Porch in 1 year.   Medication Adjustments/Labs and Tests Ordered: Current medicines are reviewed at length with the patient today.  Concerns regarding medicines are outlined above. Medication changes, Labs and Tests ordered today are summarized above and listed in the Patient Instructions accessible in Encounters.   Signed, Charlie Pitter, PA-C  02/03/2022 4:10 PM    [place] Phone: [phone]; Fax: [phone]

## 2022-02-03 ENCOUNTER — Ambulatory Visit: Payer: PPO | Admitting: Physician Assistant

## 2022-02-03 ENCOUNTER — Encounter: Payer: Self-pay | Admitting: Physician Assistant

## 2022-02-03 VITALS — BP 145/90 | HR 63 | Ht 64.5 in | Wt 144.8 lb

## 2022-02-03 DIAGNOSIS — E785 Hyperlipidemia, unspecified: Secondary | ICD-10-CM

## 2022-02-03 DIAGNOSIS — Q245 Malformation of coronary vessels: Secondary | ICD-10-CM

## 2022-02-03 DIAGNOSIS — I10 Essential (primary) hypertension: Secondary | ICD-10-CM | POA: Diagnosis not present

## 2022-02-03 DIAGNOSIS — I480 Paroxysmal atrial fibrillation: Secondary | ICD-10-CM | POA: Diagnosis not present

## 2022-02-03 NOTE — Patient Instructions (Signed)
Medication Instructions:  Your physician recommends that you continue on your current medications as directed. Please refer to the Current Medication list given to you today. *If you need a refill on your cardiac medications before your next appointment, please call your pharmacy*   Lab Work: TODAY-BMET, MAG, TSH If you have labs (blood work) drawn today and your tests are completely normal, you will receive your results only by: [place] (if you have MyChart) OR A paper copy in the mail If you have any lab test that is abnormal or we need to change your treatment, we will call you to review the results.   Testing/Procedures: NONE ORDERED   Follow-Up: At [facility], you and your health needs are our priority.  As part of our continuing mission to provide you with exceptional heart care, we have created designated Provider Care Teams.  These Care Teams include your primary Cardiologist (physician) and Advanced Practice Providers (APPs -  Physician Assistants and Nurse Practitioners) who all work together to provide you with the care you need, when you need it.  We recommend signing up for the patient portal called "MyChart".  Sign up information is provided on this After Visit Summary.  MyChart is used to connect with patients for Virtual Visits (Telemedicine).  Patients are able to view lab/test results, encounter notes, upcoming appointments, etc.  Non-urgent messages can be sent to your provider as well.   To learn more about what you can do with MyChart, go to [email or web address].    Your next appointment:   1 year(s)  The format for your next appointment:   In Person  Provider:   Candee Furbish, MD or Melina Copa, PA-C   1 WEEK NURSE VISIT FOR BLOOD PRESSURE CHECK  6 MONTH APPOINTMENT WITH A FIB CLINIC  Other Instructions   Important Information About Sugar

## 2022-02-04 DIAGNOSIS — M79672 Pain in left foot: Secondary | ICD-10-CM | POA: Diagnosis not present

## 2022-02-04 LAB — BASIC METABOLIC PANEL
BUN/Creatinine Ratio: 21 (ref 12–28)
BUN: 17 mg/dL (ref 8–27)
CO2: 22 mmol/L (ref 20–29)
Calcium: 10.8 mg/dL — ABNORMAL HIGH (ref 8.7–10.3)
Chloride: 101 mmol/L (ref 96–106)
Creatinine, Ser: 0.82 mg/dL (ref 0.57–1.00)
Glucose: 97 mg/dL (ref 70–99)
Potassium: 4.5 mmol/L (ref 3.5–5.2)
Sodium: 137 mmol/L (ref 134–144)
eGFR: 72 mL/min/{1.73_m2} (ref >59)

## 2022-02-04 LAB — MAGNESIUM: Magnesium: 2 mg/dL (ref 1.6–2.3)

## 2022-02-04 LAB — TSH: TSH: 1.84 u[IU]/mL (ref 0.450–4.500)

## 2022-02-05 ENCOUNTER — Other Ambulatory Visit: Payer: Self-pay

## 2022-02-05 DIAGNOSIS — Q245 Malformation of coronary vessels: Secondary | ICD-10-CM

## 2022-02-05 DIAGNOSIS — I480 Paroxysmal atrial fibrillation: Secondary | ICD-10-CM

## 2022-02-05 DIAGNOSIS — Z79899 Other long term (current) drug therapy: Secondary | ICD-10-CM

## 2022-02-05 MED ORDER — MAGNESIUM OXIDE 400 MG PO TABS: 400.0000 mg | ORAL_TABLET | Freq: Two times a day (BID) | ORAL | 1 refills | Status: DC

## 2022-02-05 MED ORDER — SPIRONOLACTONE 25 MG PO TABS: 25.0000 mg | ORAL_TABLET | Freq: Every day | ORAL | 1 refills | Status: DC

## 2022-02-10 ENCOUNTER — Ambulatory Visit: Payer: PPO

## 2022-02-10 ENCOUNTER — Other Ambulatory Visit: Payer: PPO

## 2022-02-10 DIAGNOSIS — Z79899 Other long term (current) drug therapy: Secondary | ICD-10-CM | POA: Diagnosis not present

## 2022-02-10 DIAGNOSIS — I10 Essential (primary) hypertension: Secondary | ICD-10-CM

## 2022-02-10 DIAGNOSIS — I480 Paroxysmal atrial fibrillation: Secondary | ICD-10-CM | POA: Diagnosis not present

## 2022-02-10 DIAGNOSIS — Q245 Malformation of coronary vessels: Secondary | ICD-10-CM

## 2022-02-10 NOTE — Patient Instructions (Signed)
Medication Instructions:  Your physician recommends that you continue on your current medications as directed. Please refer to the Current Medication list given to you today.  *If you need a refill on your cardiac medications before your next appointment, please call your pharmacy*   Lab Work: None If you have labs (blood work) drawn today and your tests are completely normal, you will receive your results only by: [place] (if you have MyChart) OR A paper copy in the mail If you have any lab test that is abnormal or we need to change your treatment, we will call you to review the results.   Testing/Procedures: NONE   Follow-Up: As scheduled At [facility], you and your health needs are our priority.  As part of our continuing mission to provide you with exceptional heart care, we have created designated Provider Care Teams.  These Care Teams include your primary Cardiologist (physician) and Advanced Practice Providers (APPs -  Physician Assistants and Nurse Practitioners) who all work together to provide you with the care you need, when you need it.    Other Instructions Please continue to monitor your BP  Important Information About Sugar

## 2022-02-10 NOTE — Progress Notes (Addendum)
   Nurse Visit   Date of Encounter: 02/10/2022 ID: Alice Medina, DOB 21-Dec-1940, MRN [id number]  PCP:  Shon Baton, MD [facility] Providers Cardiologist:  Candee Furbish, MD {    Visit Details   VS:  BP 134/72 (BP Location: Right Arm) Comment: 138/70 left arm  Ht '5\' 4"'$  (1.626 m)   Wt 145 lb (65.8 kg)   BMI 24.89 kg/m  , BMI Body mass index is 24.89 kg/m.  Wt Readings from Last 3 Encounters:  02/10/22 145 lb (65.8 kg)  02/03/22 144 lb 12.8 oz (65.7 kg)  01/19/22 143 lb (64.9 kg)     Reason for visit: BP check Performed today: Vitals BP check  Pt reported BP readings: 7/13 134/82-63  4p     7/15 165/86-58  6p     7/16 155/94-60  5p     7/17 163/92-60  5p     7/18 170/93-56  3:30 p     7/19 159/88-57  1:30 p Changes (medications, testing, etc.) : NO changes, DD will review labs and potentially increase spironolactone Length of Visit: 20 minutes    Medications Adjustments/Labs and Tests Ordered: No orders of the defined types were placed in this encounter.  No orders of the defined types were placed in this encounter.    Signed, Mertie Moores, MD  02/10/2022 4:20 PM

## 2022-02-11 ENCOUNTER — Other Ambulatory Visit: Payer: Self-pay

## 2022-02-11 LAB — BASIC METABOLIC PANEL
BUN/Creatinine Ratio: 18 (ref 12–28)
BUN: 15 mg/dL (ref 8–27)
CO2: 20 mmol/L (ref 20–29)
Calcium: 10.6 mg/dL — ABNORMAL HIGH (ref 8.7–10.3)
Chloride: 102 mmol/L (ref 96–106)
Creatinine, Ser: 0.83 mg/dL (ref 0.57–1.00)
Glucose: 91 mg/dL (ref 70–99)
Potassium: 4.6 mmol/L (ref 3.5–5.2)
Sodium: 138 mmol/L (ref 134–144)
eGFR: 71 mL/min/{1.73_m2} (ref >59)

## 2022-02-11 MED ORDER — AMLODIPINE BESYLATE 5 MG PO TABS: 5.0000 mg | ORAL_TABLET | Freq: Every day | ORAL | 3 refills | Status: DC

## 2022-02-16 ENCOUNTER — Telehealth: Payer: Self-pay | Admitting: Cardiology

## 2022-02-16 NOTE — Telephone Encounter (Signed)
Blood pressure readings that was requested  136/81; 57 137/81; 58 167/97; 55 144/83; 54 127/83; 63

## 2022-02-17 DIAGNOSIS — M79672 Pain in left foot: Secondary | ICD-10-CM | POA: Diagnosis not present

## 2022-02-17 NOTE — Telephone Encounter (Signed)
Patient returned call to office. I reviewed Dayna Dunn's recommendation:   Please make sure pt increased her amlodipine from 2.'5mg'$  to '5mg'$  daily last week and is taking spironolactone daily as prescribed. If so, find out if patient is having any issues with leg swelling on the amlodipine '5mg'$  dose. If no swelling - let's try to go to '10mg'$  daily with reporting back BP readings in 1 week (at least 3 hours after BP meds). If having any beginning of swelling, switch losartan to irbesartan '300mg'$  daily and report back BP in 1 week with BMET.    Patient understands to increase the Amlodipine. Denies any swelling, states the opposite, actually. She will report back to [name] in 1 week.

## 2022-02-22 ENCOUNTER — Encounter: Payer: Self-pay | Admitting: Gastroenterology

## 2022-02-22 ENCOUNTER — Ambulatory Visit: Payer: PPO | Admitting: Gastroenterology

## 2022-02-22 VITALS — BP 120/80 | HR 55 | Ht 64.0 in | Wt 142.2 lb

## 2022-02-22 DIAGNOSIS — K2100 Gastro-esophageal reflux disease with esophagitis, without bleeding: Secondary | ICD-10-CM

## 2022-02-22 DIAGNOSIS — R1319 Other dysphagia: Secondary | ICD-10-CM

## 2022-02-22 MED ORDER — OMEPRAZOLE 20 MG PO CPDR: DELAYED_RELEASE_CAPSULE | ORAL | 1 refills | Status: DC

## 2022-02-22 NOTE — Progress Notes (Signed)
[facility] Progress Note  Chief Complaint: GERD  Subjective  History: Alice Medina was last seen in the office January 2022, at which time heartburn and dysphagia were under good control on regular use of omeprazole.  She did not wish to take medicine long-term, and planned to use it episodically when symptoms returned.  EGD December 2021 showed no erosive esophagitis and a widely patent Schatzki ring disrupted with biopsy forceps.  Alice Medina says she is feeling well from a digestive standpoint.  Taking omeprazole 20 mg daily with no heartburn and only occasional dysphagia.  Appetite generally good and weight stable.  She has had some issues with blood pressure and a heel fracture in recent months.  ROS: Cardiovascular:  no chest pain Respiratory: no dyspnea  The patient's Past Medical, Family and Social History were reviewed and are on file in the EMR.  Objective:  Med list reviewed  Current Outpatient Medications:    acetaminophen (TYLENOL) 500 MG tablet, Take 500 mg by mouth daily as needed for mild pain., Disp: , Rfl:    amLODipine (NORVASC) 5 MG tablet, Take 1 tablet (5 mg total) by mouth daily. (Patient taking differently: Take 5 mg by mouth daily. Can take '10mg'$  if no edema), Disp: 30 tablet, Rfl: 3   apixaban (ELIQUIS) 5 MG TABS tablet, Take 1 tablet (5 mg total) by mouth 2 (two) times daily., Disp: 60 tablet, Rfl: 11   Budesonide-Formoterol Fumarate (SYMBICORT IN), Inhale into the lungs as needed. , Disp: , Rfl:    Cholecalciferol (VITAMIN D3 PO), Take 4,000 Units by mouth daily at 12 noon., Disp: , Rfl:    cyclobenzaprine (FLEXERIL) 10 MG tablet, Take 10 mg by mouth at bedtime as needed for muscle spasms., Disp: , Rfl:    cycloSPORINE (RESTASIS) 0.05 % ophthalmic emulsion, Place 1 drop into both eyes 2 (two) times daily., Disp: , Rfl:    diclofenac Sodium (VOLTAREN) 1 % GEL, Apply 2-4 grams to affected joint 4 times daily as needed., Disp: 400 g, Rfl: 2   DULoxetine (CYMBALTA) 60  MG capsule, Take 60 mg by mouth daily., Disp: , Rfl:    hydroxychloroquine (PLAQUENIL) 200 MG tablet, TAKE 1 TABLET BY MOUTH TWICE DAILY( EVERY 12 HOURS) ON MONDAY THROUGH FRIDAY ONLY., Disp: 120 tablet, Rfl: 0   levothyroxine (SYNTHROID, LEVOTHROID) 50 MCG tablet, Take 50 mcg by mouth daily., Disp: , Rfl:    LORazepam (ATIVAN) 2 MG tablet, Take 2 mg by mouth at bedtime., Disp: , Rfl:    losartan (COZAAR) 100 MG tablet, TAKE 1 TABLET(100 MG) BY MOUTH DAILY, Disp: 90 tablet, Rfl: 0   magnesium oxide (MAG-OX) 400 MG tablet, Take 1 tablet (400 mg total) by mouth 2 (two) times daily., Disp: 180 tablet, Rfl: 1   Multiple Vitamins-Minerals (MULTIVITAMIN WITH MINERALS) tablet, Take 1 tablet by mouth daily., Disp: , Rfl:    sotalol (BETAPACE) 80 MG tablet, TAKE 1 TABLET BY MOUTH TWICE DAILY.Please call and schedule an appointment for further refills 1st attempt, Disp: 180 tablet, Rfl: 0   spironolactone (ALDACTONE) 25 MG tablet, Take 1 tablet (25 mg total) by mouth daily., Disp: 90 tablet, Rfl: 1   omeprazole (PRILOSEC) 20 MG capsule, One by mouth daily.  NEEDS A FOLLOW UP FOR ADDITIONAL REFILLS, Disp: 90 capsule, Rfl: 1   Vital signs in last 24 hrs: Vitals:   02/22/22 1359  BP: 120/80  Pulse: (!) 55   Wt Readings from Last 3 Encounters:  02/22/22 142 lb 4 oz (  64.5 kg)  02/10/22 145 lb (65.8 kg)  02/03/22 144 lb 12.8 oz (65.7 kg)    Physical Exam  Well-appearing -no additional exam, entire visit spent in discussion.  Labs:   ___________________________________________ Radiologic studies:   ____________________________________________ Other:   _____________________________________________ Assessment & Plan  Assessment: Encounter Diagnoses  Name Primary?   Gastroesophageal reflux disease with esophagitis without hemorrhage Yes   Esophageal dysphagia     Nonerosive GERD with esophageal dysmotility, both under good control.  I recommended decreasing the omeprazole to 20 mg every  other day to see if she might still have good symptom control with less medicine.  She was agreeable to that, can always go back up to daily use if needed, and will contact me with any questions or concerns.  Return to clinic 1 year.   20 minutes were spent on this encounter (including chart review, history/exam, counseling/coordination of care, and documentation) > 50% of that time was spent on counseling and coordination of care.   Nelida Meuse III

## 2022-02-22 NOTE — Patient Instructions (Signed)
If you are age 82 or older, your body mass index should be between 23-30. Your Body mass index is 24.42 kg/m. If this is out of the aforementioned range listed, please consider follow up with your Primary Care Provider.  If you are age 16 or younger, your body mass index should be between 19-25. Your Body mass index is 24.42 kg/m. If this is out of the aformentioned range listed, please consider follow up with your Primary Care Provider.   ________________________________________________________  The  GI providers would like to encourage you to use [facility] to communicate with providers for non-urgent requests or questions.  Due to long hold times on the telephone, sending your provider a message by [facility] may be a faster and more efficient way to get a response.  Please allow 48 business hours for a response.  Please remember that this is for non-urgent requests.  _______________________________________________________  It was a pleasure to see you today!  Thank you for trusting me with your gastrointestinal care!

## 2022-02-24 DIAGNOSIS — M79672 Pain in left foot: Secondary | ICD-10-CM | POA: Diagnosis not present

## 2022-03-03 DIAGNOSIS — M79672 Pain in left foot: Secondary | ICD-10-CM | POA: Diagnosis not present

## 2022-03-08 ENCOUNTER — Other Ambulatory Visit: Payer: Self-pay | Admitting: Physician Assistant

## 2022-03-08 DIAGNOSIS — M359 Systemic involvement of connective tissue, unspecified: Secondary | ICD-10-CM

## 2022-03-08 DIAGNOSIS — Z79899 Other long term (current) drug therapy: Secondary | ICD-10-CM

## 2022-03-30 ENCOUNTER — Other Ambulatory Visit: Payer: Self-pay | Admitting: Cardiology

## 2022-04-22 ENCOUNTER — Telehealth: Payer: Self-pay | Admitting: Cardiology

## 2022-04-22 ENCOUNTER — Other Ambulatory Visit: Payer: Self-pay

## 2022-04-22 MED ORDER — AMLODIPINE BESYLATE 10 MG PO TABS: 10.0000 mg | ORAL_TABLET | Freq: Every day | ORAL | 1 refills | Status: DC

## 2022-04-22 MED ORDER — OMEPRAZOLE 20 MG PO CPDR: DELAYED_RELEASE_CAPSULE | ORAL | 1 refills | Status: DC

## 2022-04-22 NOTE — Telephone Encounter (Signed)
Pt is calling to report that she is doing well on the regimen of amlodipine 10 mg po daily, Dayna Dunn PA-C advised for her to take, as tolerated with no swelling experienced.   Pt states she was originally ordered to take amlodipine 5 mg po daily and titrate up to 10 mg po daily if no lower extremity edema experienced.  She has been doing the 10 mg tablets and tolerating well.  She has been in frequent contact with Melina Copa PA-C via mychart with her BP recordings, and Dayna advised her to continue taking the 10 mg strength of this medication.  Pt states being she has been taking a total of 10 mg of amlodipine daily and using the 5 mg tablets to achieve this, she only has a short supply of this left.  She is requesting a refill of amlodipine, but the 10 mg tablets be sent in vs the 5's.    Confirmed the pharmacy of choice with the pt.  Informed the pt that I will send in amlodipine 10 mg po daily tablets to her pharmacy of choice, and she should remember to take only one tablet daily, when she picks this up.   Advised her to keep Melina Copa PA-C posted as needed with her BP recordings, or if swelling experienced.   Pt verbalized understanding and agrees with this plan.  Pt was more than gracious for all the assistance provided.   Will send this message to [facility], as an FYI.

## 2022-04-22 NOTE — Telephone Encounter (Signed)
Pt c/o medication issue:  1. Name of Medication:   amLODipine (NORVASC) 5 MG tablet    2. How are you currently taking this medication (dosage and times per day)? Take 1 tablet (5 mg total) by mouth daily.Patient taking differently: Take 5 mg by mouth daily. Can take '10mg'$  if no edema  3. Are you having a reaction (difficulty breathing--STAT)? No  4. What is your medication issue? Pt would like to know if she is to continue taking medication. Pt would like a callback from nurse. Please advise

## 2022-04-23 DIAGNOSIS — H2512 Age-related nuclear cataract, left eye: Secondary | ICD-10-CM | POA: Diagnosis not present

## 2022-04-23 DIAGNOSIS — Z79899 Other long term (current) drug therapy: Secondary | ICD-10-CM | POA: Diagnosis not present

## 2022-04-23 DIAGNOSIS — H524 Presbyopia: Secondary | ICD-10-CM | POA: Diagnosis not present

## 2022-04-23 DIAGNOSIS — H353132 Nonexudative age-related macular degeneration, bilateral, intermediate dry stage: Secondary | ICD-10-CM | POA: Diagnosis not present

## 2022-04-23 DIAGNOSIS — H04123 Dry eye syndrome of bilateral lacrimal glands: Secondary | ICD-10-CM | POA: Diagnosis not present

## 2022-04-23 DIAGNOSIS — H35373 Puckering of macula, bilateral: Secondary | ICD-10-CM | POA: Diagnosis not present

## 2022-04-23 DIAGNOSIS — H52203 Unspecified astigmatism, bilateral: Secondary | ICD-10-CM | POA: Diagnosis not present

## 2022-04-23 NOTE — Telephone Encounter (Signed)
Noted, agree

## 2022-04-30 DIAGNOSIS — N390 Urinary tract infection, site not specified: Secondary | ICD-10-CM | POA: Diagnosis not present

## 2022-05-02 ENCOUNTER — Other Ambulatory Visit: Payer: Self-pay | Admitting: Cardiology

## 2022-05-02 ENCOUNTER — Other Ambulatory Visit: Payer: Self-pay | Admitting: Physician Assistant

## 2022-05-08 DIAGNOSIS — Z23 Encounter for immunization: Secondary | ICD-10-CM | POA: Diagnosis not present

## 2022-05-21 ENCOUNTER — Other Ambulatory Visit: Payer: Self-pay

## 2022-05-21 MED ORDER — OMEPRAZOLE 20 MG PO CPDR: DELAYED_RELEASE_CAPSULE | ORAL | 1 refills | Status: DC

## 2022-05-31 ENCOUNTER — Other Ambulatory Visit: Payer: Self-pay | Admitting: *Deleted

## 2022-05-31 DIAGNOSIS — Z79899 Other long term (current) drug therapy: Secondary | ICD-10-CM

## 2022-05-31 DIAGNOSIS — M359 Systemic involvement of connective tissue, unspecified: Secondary | ICD-10-CM

## 2022-05-31 MED ORDER — HYDROXYCHLOROQUINE SULFATE 200 MG PO TABS: ORAL_TABLET | ORAL | 0 refills | Status: DC

## 2022-05-31 NOTE — Telephone Encounter (Signed)
Refill request received via fax from [facility] for [facility]  Next Visit: 07/08/2022  Last Visit: 01/19/2022  Labs: 01/19/2022 CBC WNL. Calcium is elevated-10.6.  rest of CMP WNL.   Eye exam: PLQ Eye Exam: 04/23/2022 WNL   Current Dose per office note 01/19/2022: Plaquenil 200 mg 1 tablet by mouth BID Monday through Friday.    DX: Autoimmune disease   Last Fill: 01/19/2022  Okay to refill Plaquenil?

## 2022-06-07 DIAGNOSIS — E039 Hypothyroidism, unspecified: Secondary | ICD-10-CM | POA: Diagnosis not present

## 2022-06-07 DIAGNOSIS — R7989 Other specified abnormal findings of blood chemistry: Secondary | ICD-10-CM | POA: Diagnosis not present

## 2022-06-07 DIAGNOSIS — I517 Cardiomegaly: Secondary | ICD-10-CM | POA: Diagnosis not present

## 2022-06-07 DIAGNOSIS — E559 Vitamin D deficiency, unspecified: Secondary | ICD-10-CM | POA: Diagnosis not present

## 2022-06-07 DIAGNOSIS — E785 Hyperlipidemia, unspecified: Secondary | ICD-10-CM | POA: Diagnosis not present

## 2022-06-14 DIAGNOSIS — Z0000 Encounter for general adult medical examination without abnormal findings: Secondary | ICD-10-CM | POA: Diagnosis not present

## 2022-06-14 DIAGNOSIS — M3210 Systemic lupus erythematosus, organ or system involvement unspecified: Secondary | ICD-10-CM | POA: Diagnosis not present

## 2022-06-14 DIAGNOSIS — N1831 Chronic kidney disease, stage 3a: Secondary | ICD-10-CM | POA: Diagnosis not present

## 2022-06-14 DIAGNOSIS — I2510 Atherosclerotic heart disease of native coronary artery without angina pectoris: Secondary | ICD-10-CM | POA: Diagnosis not present

## 2022-06-14 DIAGNOSIS — I480 Paroxysmal atrial fibrillation: Secondary | ICD-10-CM | POA: Diagnosis not present

## 2022-06-14 DIAGNOSIS — R627 Adult failure to thrive: Secondary | ICD-10-CM | POA: Diagnosis not present

## 2022-06-14 DIAGNOSIS — D6869 Other thrombophilia: Secondary | ICD-10-CM | POA: Diagnosis not present

## 2022-06-14 DIAGNOSIS — I1310 Hypertensive heart and chronic kidney disease without heart failure, with stage 1 through stage 4 chronic kidney disease, or unspecified chronic kidney disease: Secondary | ICD-10-CM | POA: Diagnosis not present

## 2022-06-14 DIAGNOSIS — M797 Fibromyalgia: Secondary | ICD-10-CM | POA: Diagnosis not present

## 2022-06-14 DIAGNOSIS — E785 Hyperlipidemia, unspecified: Secondary | ICD-10-CM | POA: Diagnosis not present

## 2022-06-14 DIAGNOSIS — D899 Disorder involving the immune mechanism, unspecified: Secondary | ICD-10-CM | POA: Diagnosis not present

## 2022-06-14 DIAGNOSIS — G629 Polyneuropathy, unspecified: Secondary | ICD-10-CM | POA: Diagnosis not present

## 2022-06-14 DIAGNOSIS — R82998 Other abnormal findings in urine: Secondary | ICD-10-CM | POA: Diagnosis not present

## 2022-06-25 NOTE — Progress Notes (Signed)
Office Visit Note  Patient: Alice Medina             Date of Birth: 1940-12-12           MRN: [id number]             PCP: Shon Baton, MD Referring: Shon Baton, MD Visit Date: 07/08/2022 Occupation: '@GUAROCC'$ @  Subjective:  Right shoulder and left hip pain.  History of Present Illness: Alice Medina is a 81 y.o. female with history of Sjogren's,osteoarthritis and osteoporosis.  She developed left calcaneal fracture in April 2023.  She gradually recovered from that.  She was using a walker for about 6 weeks which puts extra strain on her right shoulder.  She states since then she has been having discomfort in her right shoulder joint and her left trochanteric region.  She continues to have some discomfort in her hands due to underlying osteoarthritis.  She also has osteoarthritis in her knee joints and her feet.  She continues to take hydroxychloroquine 200 mg p.o. twice daily Monday to Friday without any side effects or interruption.  She states recently her blood pressure has been running low and she has been followed by her cardiologist.  She has not started the treatment for osteoporosis.   Activities of Daily Living:  Patient reports morning stiffness for 30 minutes.   Patient Reports nocturnal pain.  Difficulty dressing/grooming: Denies Difficulty climbing stairs: Reports Difficulty getting out of chair: Reports Difficulty using hands for taps, buttons, cutlery, and/or writing: Reports  Review of Systems  Constitutional:  Positive for fatigue.  HENT:  Positive for mouth dryness.   Eyes:  Positive for dryness.  Respiratory:  Negative for shortness of breath.   Cardiovascular:  Negative for chest pain and palpitations.  Gastrointestinal:  Negative for blood in stool, constipation and diarrhea.  Endocrine: Negative for increased urination.  Genitourinary:  Negative for difficulty urinating.  Musculoskeletal:  Positive for joint pain, gait problem, joint pain, myalgias,  muscle weakness, morning stiffness, muscle tenderness and myalgias. Negative for joint swelling.  Skin:  Positive for sensitivity to sunlight. Negative for color change and rash.  Allergic/Immunologic: Negative for susceptible to infections.  Neurological:  Positive for dizziness. Negative for headaches.  Hematological:  Negative for swollen glands.  Psychiatric/Behavioral:  Positive for depressed mood and sleep disturbance. The patient is nervous/anxious.     PMFS History:  Patient Active Problem List   Diagnosis Date Noted   Paroxysmal atrial fibrillation ([place]) 04/07/2020   Secondary hypercoagulable state ([place]) 04/07/2020   Multiple closed fractures of metacarpal bone 07/25/2017   Autoimmune disease ([place]) +ANA +Ro +La +RF oral ulcers nasal ulcers Raynaud photosensitivity SICCA  01/13/2017   High risk medication use 01/13/2017   Photosensitivity 01/13/2017   Sjogren's syndrome with keratoconjunctivitis sicca ([place]) 01/13/2017   Recurrent oral ulcers and nasal ulcers  01/13/2017   Raynaud's disease without gangrene 01/13/2017   Primary osteoarthritis of both hands 01/13/2017   Primary osteoarthritis of both knees 01/13/2017   Primary osteoarthritis of both feet 01/13/2017   Osteopenia of multiple sites 01/13/2017   History of hypertension 01/13/2017   History of hypothyroidism 01/13/2017   History of gastroesophageal reflux (GERD)/ PUD 01/13/2017   History of MRSA infection 01/13/2017   Diarrhea 05/28/2013   Family history of malignant neoplasm of gastrointestinal tract 06/22/2011   ADENOCARCINOMA, BREAST 05/02/2009   CORONARY ARTERY DISEASE 05/02/2009   DYSPHAGIA UNSPECIFIED 05/02/2009    Past Medical History:  Diagnosis Date   ADENOCARCINOMA, BREAST 05/02/2009  Qualifier: Diagnosis of  By: Deatra Ina MD, Sandy Salaam    Anxiety    Arthritis    Autoimmune disorder ([place])    SJORGREN'S   Breast cancer ([place])    right breast   Chronic headaches    CORONARY ARTERY DISEASE    cath in  1990 (normal Left main, normal CFX, normal RCA, myocardial bridge in LAD per Dr. Thurman Coyer note)   Depression    Diarrhea 05/28/2013   DYSPHAGIA UNSPECIFIED 05/02/2009   Qualifier: Diagnosis of  By: Deatra Ina MD, Sandy Salaam    Family history of malignant neoplasm of gastrointestinal tract 06/22/2011   Brother with colon cancer    GERD (gastroesophageal reflux disease)    Hemorrhage of rectum and anus 06/22/2011   History of hypothyroidism 01/13/2017   History of MRSA infection 01/13/2017   Hypertension    Multiple closed fractures of metacarpal bone 07/25/2017   Myocardial bridge    Myocardial infarction [facility])    1990   Neuromuscular disorder ([place])    LUPUS   Osteopenia of multiple sites 01/13/2017   Osteoporosis    PAC (premature atrial contraction) 04/07/2020   PAF (paroxysmal atrial fibrillation) ([facility])    Photosensitivity 01/13/2017   Primary osteoarthritis of both hands 01/13/2017   Raynaud's disease without gangrene 01/13/2017   Recurrent oral ulcers and nasal ulcers  01/13/2017   Sjogren's syndrome with keratoconjunctivitis sicca ([place]) 01/13/2017   Status post dilation of esophageal narrowing    Thyroid disease    Ulcer     Family History  Problem Relation Age of Onset   Breast cancer Mother    Lung cancer Mother    Cancer Father        larynx   Heart failure Father    Heart disease Brother    Colon cancer Brother    Pseudochol deficiency Other    Breast cancer Sister    Cancer Sister        thyroid    Breast cancer Maternal Grandmother    Autoimmune disease Daughter    Cancer Son        prostate    Diabetes Son    Rheum arthritis Grandchild    Esophageal cancer Neg Hx    Rectal cancer Neg Hx    Stomach cancer Neg Hx    Past Surgical History:  Procedure Laterality Date   APPENDECTOMY     breast reconstuction     x 2   CHOLECYSTECTOMY     COLONOSCOPY     double mastectomy     PARTIAL HYSTERECTOMY  1990   TIBIA FRACTURE SURGERY Right    with subsequent  hardware removal   UPPER GASTROINTESTINAL ENDOSCOPY     WRIST FRACTURE SURGERY Left    Social History   Social History Narrative   1 cup of coffee daily   Immunization History  Administered Date(s) Administered   Influenza Split 05/07/2010, 05/14/2011, 05/12/2012, 04/19/2013, 04/15/2014   Influenza, Quadrivalent, Recombinant, Inj, Pf 04/22/2018, 04/07/2019, 05/10/2020   Influenza,inj,Quad PF,6+ Mos 04/15/2014, 05/19/2015   Influenza-Unspecified 04/24/2016, 04/23/2017   PFIZER(Purple Top)SARS-COV-2 Vaccination 09/03/2019, 09/28/2019, 06/27/2020   Pneumococcal Conjugate-13 11/20/2013, 12/24/2014   Pneumococcal Polysaccharide-23 09/08/2007, 01/06/2018   Td,absorbed, Preservative Free, Adult Use, Lf Unspecified 04/13/2011   Tdap 10/29/2009   Unspecified SARS-COV-2 Vaccination 09/03/2019, 09/28/2019   Zoster Recombinat (Shingrix) 01/10/2018, 05/11/2018, 09/19/2018   Zoster, Live 11/20/2013, 01/13/2018, 05/11/2018     Objective: Vital Signs: BP 104/63 (BP Location: Left Arm, Patient Position: Sitting, Cuff Size: Normal)  Pulse (!) 58   Resp 16   Ht 5' 4.5" (1.638 m)   Wt 141 lb (64 kg)   BMI 23.83 kg/m    Physical Exam Vitals and nursing note reviewed.  Constitutional:      Appearance: She is well-developed.  HENT:     Head: Normocephalic and atraumatic.  Eyes:     Conjunctiva/sclera: Conjunctivae normal.  Cardiovascular:     Rate and Rhythm: Normal rate and regular rhythm.     Heart sounds: Normal heart sounds.  Pulmonary:     Effort: Pulmonary effort is normal.     Breath sounds: Normal breath sounds.  Abdominal:     General: Bowel sounds are normal.     Palpations: Abdomen is soft.  Musculoskeletal:     Cervical back: Normal range of motion.  Lymphadenopathy:     Cervical: No cervical adenopathy.  Skin:    General: Skin is warm and dry.     Capillary Refill: Capillary refill takes less than 2 seconds.  Neurological:     Mental Status: She is alert and oriented  to person, place, and time.  Psychiatric:        Behavior: Behavior normal.      Musculoskeletal Exam: Cervical spine was in good range of motion.  Discomfort range of motion lumbar spine.  Shoulder joints, elbow joints, wrist joints with good range of motion.  She had bilateral CMC PIP and DIP thickening.  Hip joints and knee joints in good range of motion.  She had no tenderness over ankles or MTPs.  CDAI Exam: CDAI Score: -- Patient Global: --; Provider Global: -- Swollen: --; Tender: -- Joint Exam 07/08/2022   No joint exam has been documented for this visit   There is currently no information documented on the homunculus. Go to the Rheumatology activity and complete the homunculus joint exam.  Investigation: No additional findings.  Imaging: No results found.  Recent Labs: Lab Results  Component Value Date   WBC 4.3 01/19/2022   HGB 12.7 01/19/2022   PLT 182 01/19/2022   NA 138 02/10/2022   K 4.6 02/10/2022   CL 102 02/10/2022   CO2 20 02/10/2022   GLUCOSE 91 02/10/2022   BUN 15 02/10/2022   CREATININE 0.83 02/10/2022   BILITOT 0.4 01/19/2022   ALKPHOS 79 12/13/2017   AST 27 01/19/2022   ALT 15 01/19/2022   PROT 7.3 01/19/2022   ALBUMIN 3.6 12/13/2017   CALCIUM 10.6 (H) 02/10/2022   GFRAA 81 10/16/2020    18 2023 LDL 79, CMP normal except calcium 11.0, CBC WBC 4.  1 4, hemoglobin 13.4, platelets 186, TSH 2.31, vitamin D 71.6  Speciality Comments: PLQ Eye Exam: 04/23/2022 WNL, Patient has macular degeneration which confounds for PLQ toxicity, probably no toxicity but hard to say. Patient to follow up with her Retina specialist.   Procedures:  No procedures performed Allergies: Codeine, Robinul [glycopyrrolate], and Sulfamethoxazole-trimethoprim   Assessment / Plan:     Visit Diagnoses: Autoimmune disease ([place]) - +ANA +Ro +La +RF oral ulcers nasal ulcers Raynaud photosensitivity, sicca: History of dry mouth and dry eye symptoms.  She denies any recent  episodes of Raynaud's phenomenon or oral ulcers or nasal ulcers.  She has been taking hydroxychloroquine 200 mg p.o. twice daily Monday to Friday without interruption.  She has been using over-the-counter products for sicca symptoms.  She denies any history of lymphadenopathy or inflammatory arthritis.  Labs obtained on January 19, 2022 were reviewed.  Her complements  were normal and sed rate was mildly elevated.  Double-stranded DNA was negative.  Recheck labs today and will call her with the results once available.  Sjogren's syndrome with keratoconjunctivitis sicca (HCC)-sicca symptoms, positive Ro, positive loss antibody.  Rheumatoid factor has been negative.  She has been using over-the-counter products which were also discussed with her.  She denies any shortness of breath.  She has been taking hydroxychloroquine on a regular basis.  High risk medication use - Plaquenil 200 mg 1 tablet by mouth BID Monday through Friday. PLQ Eye Exam: 04/23/2022.  I will check labs today.  Information on immunization was placed in the AVS.  Raynaud's disease without gangrene-keeping core temperature warm and warm clothing was discussed.  She had good capillary refill without any nailbed capillary changes.  No digital ulcers were noted.  Chronic right shoulder pain-she has been having discomfort in her right shoulder muscles since she uses walker.  The pain is gradually getting better.  Shoulder joint was in good range of motion without any discomfort.  Primary osteoarthritis of both hands-she had bilateral PIP and DIP thickening.  Joint protection muscle strengthening was discussed.  Trochanteric bursitis, left hip-she has been having discomfort over left trochanteric bursa since she used a walker.  A handout on IT band stretches was given.  Primary osteoarthritis of both knees-she complains of some discomfort in her knee joints.  No warmth swelling or effusion was noted.  Primary osteoarthritis of both feet-proper  fitting shoes were advised.  History of foot fracture - Closed fracture of left calcaneus-11/16/21.  Patient states she had good recovery.  Age-related osteoporosis without current pathological fracture - 12/14/18 DEXA BMD 0.523 with T-score -2.9 1/3 right forearm.  DEXA on 12/15/2020-results unclear on scanned copy in epic. remains within the osteoporosis range.  We had detailed discussion with the patient during the last visit regarding possible use of Prolia, bisphosphonates and anabolic agents.  Was previously on which she discontinued in December 2022.  Increased risk of fracture was discussed at length.  Patient wants to hold off on the treatment for osteoporosis.  History of hypertension-blood pressure was normal today.  She states her blood pressure has been running low.  She will discuss with the cardiologist.  Other medical problems are listed as follows:  History of atrial fibrillation  History of hypothyroidism  History of MRSA infection  History of gastroesophageal reflux (GERD)/ PUD  Vitamin D deficiency-her vitamin D was 71.6.  Calcium was also elevated.  She states she recently decrease the dose of vitamin D per recommendations of her PCP.  Orders: Orders Placed This Encounter  Procedures   CBC with Differential/Platelet   COMPLETE METABOLIC PANEL WITH GFR   Urinalysis, Routine w reflex microscopic   Sedimentation rate   C3 and C4   Anti-DNA antibody, double-stranded   No orders of the defined types were placed in this encounter.    Follow-Up Instructions: Return in about 5 months (around 12/07/2022) for Sjogren's, Osteoarthritis, Osteoporosis.   Bo Merino, MD  Note - This record has been created using Editor, commissioning.  Chart creation errors have been sought, but may not always  have been located. Such creation errors do not reflect on  the standard of medical care.

## 2022-07-07 ENCOUNTER — Ambulatory Visit: Payer: PPO | Admitting: Rheumatology

## 2022-07-08 ENCOUNTER — Encounter: Payer: Self-pay | Admitting: Rheumatology

## 2022-07-08 ENCOUNTER — Ambulatory Visit: Payer: PPO | Attending: Rheumatology | Admitting: Rheumatology

## 2022-07-08 VITALS — BP 104/63 | HR 58 | Resp 16 | Ht 64.5 in | Wt 141.0 lb

## 2022-07-08 DIAGNOSIS — Z8781 Personal history of (healed) traumatic fracture: Secondary | ICD-10-CM

## 2022-07-08 DIAGNOSIS — M810 Age-related osteoporosis without current pathological fracture: Secondary | ICD-10-CM | POA: Diagnosis not present

## 2022-07-08 DIAGNOSIS — M3501 Sicca syndrome with keratoconjunctivitis: Secondary | ICD-10-CM | POA: Diagnosis not present

## 2022-07-08 DIAGNOSIS — M19041 Primary osteoarthritis, right hand: Secondary | ICD-10-CM | POA: Diagnosis not present

## 2022-07-08 DIAGNOSIS — M170 Bilateral primary osteoarthritis of knee: Secondary | ICD-10-CM

## 2022-07-08 DIAGNOSIS — G8929 Other chronic pain: Secondary | ICD-10-CM

## 2022-07-08 DIAGNOSIS — I7300 Raynaud's syndrome without gangrene: Secondary | ICD-10-CM

## 2022-07-08 DIAGNOSIS — Z8614 Personal history of Methicillin resistant Staphylococcus aureus infection: Secondary | ICD-10-CM

## 2022-07-08 DIAGNOSIS — E559 Vitamin D deficiency, unspecified: Secondary | ICD-10-CM

## 2022-07-08 DIAGNOSIS — Z8639 Personal history of other endocrine, nutritional and metabolic disease: Secondary | ICD-10-CM

## 2022-07-08 DIAGNOSIS — M359 Systemic involvement of connective tissue, unspecified: Secondary | ICD-10-CM

## 2022-07-08 DIAGNOSIS — Z79899 Other long term (current) drug therapy: Secondary | ICD-10-CM | POA: Diagnosis not present

## 2022-07-08 DIAGNOSIS — Z8679 Personal history of other diseases of the circulatory system: Secondary | ICD-10-CM

## 2022-07-08 DIAGNOSIS — M7062 Trochanteric bursitis, left hip: Secondary | ICD-10-CM

## 2022-07-08 DIAGNOSIS — M19071 Primary osteoarthritis, right ankle and foot: Secondary | ICD-10-CM | POA: Diagnosis not present

## 2022-07-08 DIAGNOSIS — M19042 Primary osteoarthritis, left hand: Secondary | ICD-10-CM

## 2022-07-08 DIAGNOSIS — M25511 Pain in right shoulder: Secondary | ICD-10-CM

## 2022-07-08 DIAGNOSIS — M19072 Primary osteoarthritis, left ankle and foot: Secondary | ICD-10-CM

## 2022-07-08 DIAGNOSIS — Z8719 Personal history of other diseases of the digestive system: Secondary | ICD-10-CM

## 2022-07-08 NOTE — Patient Instructions (Signed)
Vaccines You are taking a medication(s) that can suppress your immune system.  The following immunizations are recommended: Flu annually Covid-19  Td/Tdap (tetanus, diphtheria, pertussis) every 10 years Pneumonia (Prevnar 15 then Pneumovax 23 at least 1 year apart.  Alternatively, can take Prevnar 20 without needing additional dose) Shingrix: 2 doses from 4 weeks to 6 months apart  Please check with your PCP to make sure you are up to date.    Iliotibial Band Syndrome Rehab Ask your health care provider which exercises are safe for you. Do exercises exactly as told by your health care provider and adjust them as directed. It is normal to feel mild stretching, pulling, tightness, or discomfort as you do these exercises. Stop right away if you feel sudden pain or your pain gets significantly worse. Do not begin these exercises until told by your health care provider. Stretching and range-of-motion exercises These exercises warm up your muscles and joints and improve the movement and flexibility of your hip and pelvis. Quadriceps stretch, prone  Lie on your abdomen (prone position) on a firm surface, such as a bed or padded floor. Bend your left / right knee and reach back to hold your ankle or pant leg. If you cannot reach your ankle or pant leg, loop a belt around your foot and grab the belt instead. Gently pull your heel toward your buttocks. Your knee should not slide out to the side. You should feel a stretch in the front of your thigh and knee (quadriceps). Hold this position for __________ seconds. Repeat __________ times. Complete this exercise __________ times a day. Iliotibial band stretch An iliotibial band is a strong band of muscle tissue that runs from the outer side of your hip to the outer side of your thigh and knee. Lie on your side with your left / right leg in the top position. Bend both of your knees and grab your left / right ankle. Stretch out your bottom arm to help you  balance. Slowly bring your top knee back so your thigh goes behind your trunk. Slowly lower your top leg toward the floor until you feel a gentle stretch on the outside of your left / right hip and thigh. If you do not feel a stretch and your knee will not fall farther, place the heel of your other foot on top of your knee and pull your knee down toward the floor with your foot. Hold this position for __________ seconds. Repeat __________ times. Complete this exercise __________ times a day. Strengthening exercises These exercises build strength and endurance in your hip and pelvis. Endurance is the ability to use your muscles for a long time, even after they get tired. Straight leg raises, side-lying This exercise strengthens the muscles that rotate the leg at the hip and move it away from your body (hip abductors). Lie on your side with your left / right leg in the top position. Lie so your head, shoulder, hip, and knee line up. You may bend your bottom knee to help you balance. Roll your hips slightly forward so your hips are stacked directly over each other and your left / right knee is facing forward. Tense the muscles in your outer thigh and lift your top leg 4-6 inches (10-15 cm). Hold this position for __________ seconds. Slowly lower your leg to return to the starting position. Let your muscles relax completely before doing another repetition. Repeat __________ times. Complete this exercise __________ times a day. Leg raises, prone This  exercise strengthens the muscles that move the hips backward (hip extensors). Lie on your abdomen (prone position) on your bed or a firm surface. You can put a pillow under your hips if that is more comfortable for your lower back. Bend your left / right knee so your foot is straight up in the air. Squeeze your buttocks muscles and lift your left / right thigh off the bed. Do not let your back arch. Tense your thigh muscle as hard as you can without  increasing any knee pain. Hold this position for __________ seconds. Slowly lower your leg to return to the starting position and allow it to relax completely. Repeat __________ times. Complete this exercise __________ times a day. Hip hike Stand sideways on a bottom step. Stand on your left / right leg with your other foot unsupported next to the step. You can hold on to a railing or wall for balance if needed. Keep your knees straight and your torso square. Then lift your left / right hip up toward the ceiling. Slowly let your left / right hip lower toward the floor, past the starting position. Your foot should get closer to the floor. Do not lean or bend your knees. Repeat __________ times. Complete this exercise __________ times a day. This information is not intended to replace advice given to you by your health care provider. Make sure you discuss any questions you have with your health care provider. Document Revised: 09/19/2019 Document Reviewed: 09/19/2019 Elsevier Patient Education  [employer].

## 2022-07-10 LAB — C3 AND C4
C3 Complement: 131 mg/dL
C4 Complement: 23 mg/dL

## 2022-07-10 LAB — COMPLETE METABOLIC PANEL WITH GFR
AG Ratio: 1.1 (calc) (ref 1.0–2.5)
ALT: 19 U/L (ref 6–29)
AST: 32 U/L (ref 10–35)
Albumin: 3.9 g/dL (ref 3.6–5.1)
Alkaline phosphatase (APISO): 96 U/L (ref 37–153)
BUN/Creatinine Ratio: 20 (calc) (ref 6–22)
BUN: 22 mg/dL (ref 7–25)
CO2: 25 mmol/L (ref 20–32)
Calcium: 10.6 mg/dL — ABNORMAL HIGH (ref 8.6–10.4)
Chloride: 102 mmol/L (ref 98–110)
Creat: 1.1 mg/dL — ABNORMAL HIGH (ref 0.60–0.95)
Globulin: 3.6 g/dL (calc) (ref 1.9–3.7)
Glucose, Bld: 90 mg/dL (ref 65–99)
Potassium: 4.8 mmol/L (ref 3.5–5.3)
Sodium: 135 mmol/L (ref 135–146)
Total Bilirubin: 0.4 mg/dL (ref 0.2–1.2)
Total Protein: 7.5 g/dL (ref 6.1–8.1)
eGFR: 50 mL/min/{1.73_m2} — ABNORMAL LOW (ref >=60)

## 2022-07-10 LAB — URINALYSIS, ROUTINE W REFLEX MICROSCOPIC
Bacteria, UA: NONE SEEN /HPF
Bilirubin Urine: NEGATIVE
Glucose, UA: NEGATIVE
Hgb urine dipstick: NEGATIVE
Hyaline Cast: NONE SEEN /LPF
Ketones, ur: NEGATIVE
Nitrite: NEGATIVE
Protein, ur: NEGATIVE
RBC / HPF: NONE SEEN /HPF (ref 0–2)
Specific Gravity, Urine: 1.012 (ref 1.001–1.035)
pH: 5.5 (ref 5.0–8.0)

## 2022-07-10 LAB — CBC WITH DIFFERENTIAL/PLATELET
Absolute Monocytes: 735 cells/uL (ref 200–950)
Basophils Absolute: 10 cells/uL (ref 0–200)
Basophils Relative: 0.2 %
Eosinophils Absolute: 70 cells/uL (ref 15–500)
Eosinophils Relative: 1.4 %
HCT: 38.4 % (ref 35.0–45.0)
Hemoglobin: 12.9 g/dL (ref 11.7–15.5)
Lymphs Abs: 1765 cells/uL (ref 850–3900)
MCH: 31.2 pg (ref 27.0–33.0)
MCHC: 33.6 g/dL (ref 32.0–36.0)
MCV: 92.8 fL (ref 80.0–100.0)
MPV: 10.4 fL (ref 7.5–12.5)
Monocytes Relative: 14.7 %
Neutro Abs: 2420 cells/uL (ref 1500–7800)
Neutrophils Relative %: 48.4 %
Platelets: 192 10*3/uL (ref 140–400)
RBC: 4.14 10*6/uL (ref 3.80–5.10)
RDW: 12.6 % (ref 11.0–15.0)
Total Lymphocyte: 35.3 %
WBC: 5 10*3/uL (ref 3.8–10.8)

## 2022-07-10 LAB — ANTI-DNA ANTIBODY, DOUBLE-STRANDED: ds DNA Ab: 1 IU/mL

## 2022-07-10 LAB — MICROSCOPIC MESSAGE

## 2022-07-10 LAB — SEDIMENTATION RATE: Sed Rate: 34 mm/h — ABNORMAL HIGH (ref 0–30)

## 2022-07-12 NOTE — Progress Notes (Signed)
CBC normal, Calcium is elevated , she should avoid calcium supplement, Cr is elevated due to the use of diuretics. Sed rate is mildly elevated and stable.Autoimmune labs are stable and do not indicate a disease flare.

## 2022-07-27 ENCOUNTER — Other Ambulatory Visit: Payer: Self-pay | Admitting: Physician Assistant

## 2022-08-06 ENCOUNTER — Ambulatory Visit (HOSPITAL_COMMUNITY)
Admission: RE | Admit: 2022-08-06 | Discharge: 2022-08-06 | Disposition: A | Discharge status: Home / Self Care | Payer: PPO | Source: Ambulatory Visit | Attending: Physician Assistant | Admitting: Physician Assistant

## 2022-08-06 VITALS — BP 108/66 | HR 52 | Ht 64.5 in | Wt 139.8 lb

## 2022-08-06 DIAGNOSIS — Q245 Malformation of coronary vessels: Secondary | ICD-10-CM | POA: Insufficient documentation

## 2022-08-06 DIAGNOSIS — I2510 Atherosclerotic heart disease of native coronary artery without angina pectoris: Secondary | ICD-10-CM | POA: Diagnosis not present

## 2022-08-06 DIAGNOSIS — D6869 Other thrombophilia: Secondary | ICD-10-CM | POA: Diagnosis not present

## 2022-08-06 DIAGNOSIS — I440 Atrioventricular block, first degree: Secondary | ICD-10-CM | POA: Diagnosis not present

## 2022-08-06 DIAGNOSIS — Z79899 Other long term (current) drug therapy: Secondary | ICD-10-CM | POA: Diagnosis not present

## 2022-08-06 DIAGNOSIS — Z7901 Long term (current) use of anticoagulants: Secondary | ICD-10-CM | POA: Diagnosis not present

## 2022-08-06 DIAGNOSIS — M3500 Sicca syndrome, unspecified: Secondary | ICD-10-CM | POA: Insufficient documentation

## 2022-08-06 DIAGNOSIS — I10 Essential (primary) hypertension: Secondary | ICD-10-CM | POA: Diagnosis not present

## 2022-08-06 DIAGNOSIS — I480 Paroxysmal atrial fibrillation: Secondary | ICD-10-CM | POA: Diagnosis not present

## 2022-08-06 DIAGNOSIS — R001 Bradycardia, unspecified: Secondary | ICD-10-CM | POA: Diagnosis not present

## 2022-08-06 LAB — MAGNESIUM: Magnesium: 2 mg/dL (ref 1.7–2.4)

## 2022-08-06 NOTE — Progress Notes (Signed)
Primary Care Physician: Shon Baton, MD Primary Cardiologist: Dr Marlou Porch Primary Electrophysiologist: none Referring Physician: [facility]/Dr Marlou Porch   Alice Medina is a 82 y.o. female with a history of CAD with myocardial bridging, Sjogren's syndrome, HTN, HLD, and atrial fibrillation who presents for follow up in the [facility]. The patient was initially diagnosed with atrial fibrillation remotely and has been maintained on sotalol. Patient is on Eliquis for a CHADS2VASC score of 5.   On follow up today, patient reports that she has done well since her last visit. She has not had any tachypalpitations. She does have occasional SOB with exertion but this is very infrequent and not associated with any other symptoms. No bleeding issues on anticoagulation.   Today, she denies symptoms of palpitations, chest pain, shortness of breath, orthopnea, PND, lower extremity edema, dizziness, presyncope, syncope, snoring, daytime somnolence, bleeding, or neurologic sequela. The patient is tolerating medications without difficulties and is otherwise without complaint today.    Atrial Fibrillation Risk Factors:  she does not have symptoms or diagnosis of sleep apnea. she does not have a history of rheumatic fever.   she has a BMI of Body mass index is 23.63 kg/m.Marland Kitchen Filed Weights   08/06/22 0942  Weight: 63.4 kg    Family History  Problem Relation Age of Onset   Breast cancer Mother    Lung cancer Mother    Cancer Father        larynx   Heart failure Father    Heart disease Brother    Colon cancer Brother    Pseudochol deficiency Other    Breast cancer Sister    Cancer Sister        thyroid    Breast cancer Maternal Grandmother    Autoimmune disease Daughter    Cancer Son        prostate    Diabetes Son    Rheum arthritis Grandchild    Esophageal cancer Neg Hx    Rectal cancer Neg Hx    Stomach cancer Neg Hx      Atrial Fibrillation Management  history:  Previous antiarrhythmic drugs: sotalol Previous cardioversions: none Previous ablations: none CHADS2VASC score: 5 Anticoagulation history: Eliquis   Past Medical History:  Diagnosis Date   ADENOCARCINOMA, BREAST 05/02/2009   Qualifier: Diagnosis of  By: Deatra Ina MD, Sandy Salaam    Anxiety    Arthritis    Autoimmune disorder ([place])    SJORGREN'S   Breast cancer ([place])    right breast   Chronic headaches    CORONARY ARTERY DISEASE    cath in 1990 (normal Left main, normal CFX, normal RCA, myocardial bridge in LAD per Dr. Thurman Coyer note)   Depression    Diarrhea 05/28/2013   DYSPHAGIA UNSPECIFIED 05/02/2009   Qualifier: Diagnosis of  By: Deatra Ina MD, Sandy Salaam    Family history of malignant neoplasm of gastrointestinal tract 06/22/2011   Brother with colon cancer    GERD (gastroesophageal reflux disease)    Hemorrhage of rectum and anus 06/22/2011   History of hypothyroidism 01/13/2017   History of MRSA infection 01/13/2017   Hypertension    Multiple closed fractures of metacarpal bone 07/25/2017   Myocardial bridge    Myocardial infarction [facility])    1990   Neuromuscular disorder ([place])    LUPUS   Osteopenia of multiple sites 01/13/2017   Osteoporosis    PAC (premature atrial contraction) 04/07/2020   PAF (paroxysmal atrial fibrillation) ([place])    Photosensitivity 01/13/2017  Primary osteoarthritis of both hands 01/13/2017   Raynaud's disease without gangrene 01/13/2017   Recurrent oral ulcers and nasal ulcers  01/13/2017   Sjogren's syndrome with keratoconjunctivitis sicca ([place]) 01/13/2017   Status post dilation of esophageal narrowing    Thyroid disease    Ulcer    Past Surgical History:  Procedure Laterality Date   APPENDECTOMY     breast reconstuction     x 2   CHOLECYSTECTOMY     COLONOSCOPY     double mastectomy     PARTIAL HYSTERECTOMY  1990   TIBIA FRACTURE SURGERY Right    with subsequent hardware removal   UPPER GASTROINTESTINAL ENDOSCOPY     WRIST  FRACTURE SURGERY Left     Current Outpatient Medications  Medication Sig Dispense Refill   acetaminophen (TYLENOL) 500 MG tablet Take 500 mg by mouth daily as needed for mild pain.     amLODipine (NORVASC) 10 MG tablet Take 1 tablet (10 mg total) by mouth daily. 90 tablet 1   apixaban (ELIQUIS) 5 MG TABS tablet Take 1 tablet (5 mg total) by mouth 2 (two) times daily. 60 tablet 11   Budesonide-Formoterol Fumarate (SYMBICORT IN) Inhale into the lungs as needed.      Cholecalciferol (VITAMIN D3 PO) Take 2,000 Units by mouth daily at 12 noon.     cyclobenzaprine (FLEXERIL) 10 MG tablet Take 10 mg by mouth at bedtime as needed for muscle spasms.     cycloSPORINE (RESTASIS) 0.05 % ophthalmic emulsion Place 1 drop into both eyes 2 (two) times daily.     diclofenac Sodium (VOLTAREN) 1 % GEL Apply 2-4 grams to affected joint 4 times daily as needed. 400 g 2   DULoxetine (CYMBALTA) 60 MG capsule Take 60 mg by mouth daily.     hydroxychloroquine (PLAQUENIL) 200 MG tablet TAKE 1 TABLET BY MOUTH TWICE DAILY( EVERY 12 HOURS) ON MONDAY THROUGH FRIDAY ONLY. 120 tablet 0   levothyroxine (SYNTHROID, LEVOTHROID) 50 MCG tablet Take 50 mcg by mouth daily.     LORazepam (ATIVAN) 2 MG tablet Take 2 mg by mouth at bedtime.     losartan (COZAAR) 100 MG tablet TAKE 1 TABLET(100 MG) BY MOUTH DAILY 90 tablet 3   magnesium oxide (MAG-OX) 400 MG tablet Take 1 tablet (400 mg total) by mouth 2 (two) times daily. 180 tablet 1   Multiple Vitamins-Minerals (MULTIVITAMIN WITH MINERALS) tablet Take 1 tablet by mouth daily.     Multiple Vitamins-Minerals (SYSTANE ICAPS AREDS2 PO) Taking 1 capsule by mouth every day and 3 times in the evening- Tuesday, Thursday and Saturday     omeprazole (PRILOSEC) 20 MG capsule One by mouth daily. (Patient taking differently: Take 20 mg by mouth as needed.) 90 capsule 1   Polyethyl Glycol-Propyl Glycol 0.4-0.3 % SOLN Apply 1 drop to eye as needed.     sotalol (BETAPACE) 80 MG tablet TAKE 1 TABLET  BY MOUTH TWICE DAILY 180 tablet 3   spironolactone (ALDACTONE) 25 MG tablet TAKE 1 TABLET(25 MG) BY MOUTH DAILY 90 tablet 1   No current facility-administered medications for this encounter.    Allergies  Allergen Reactions   Codeine Other (See Comments)    Made stomach spasm   Robinul [Glycopyrrolate] Other (See Comments)    AFIB, made heart race and BP go up   Sulfamethoxazole-Trimethoprim Other (See Comments)    Peripheral neuropathy in hands    Social History   Socioeconomic History   Marital status: Widowed    Spouse  name: Not on file   Number of children: 2   Years of education: Not on file   Highest education level: Not on file  Occupational History   Occupation: retired LPN  Tobacco Use   Smoking status: Never    Passive exposure: Past   Smokeless tobacco: Never  Vaping Use   Vaping Use: Never used  Substance and Sexual Activity   Alcohol use: Not Currently   Drug use: Never   Sexual activity: Not on file  Other Topics Concern   Not on file  Social History Narrative   1 cup of coffee daily   Social Determinants of Health   Financial Resource Strain: Not on file  Food Insecurity: Not on file  Transportation Needs: Not on file  Physical Activity: Not on file  Stress: Not on file  Social Connections: Not on file  Intimate Partner Violence: Not on file     ROS- All systems are reviewed and negative except as per the HPI above.  Physical Exam: Vitals:   08/06/22 0942  BP: 108/66  Pulse: (!) 52  Weight: 63.4 kg  Height: 5' 4.5" (1.638 m)    GEN- The patient is a well appearing elderly female, alert and oriented x 3 today.   HEENT-head normocephalic, atraumatic, sclera clear, conjunctiva pink, hearing intact, trachea midline. Lungs- Clear to ausculation bilaterally, normal work of breathing Heart- Regular rate and rhythm, no murmurs, rubs or gallops  GI- soft, NT, ND, + BS Extremities- no clubbing, cyanosis, or edema MS- no significant deformity  or atrophy Skin- no rash or lesion Psych- euthymic mood, full affect Neuro- strength and sensation are intact   Wt Readings from Last 3 Encounters:  08/06/22 63.4 kg  07/08/22 64 kg  02/22/22 64.5 kg    EKG today demonstrates  SB, 1st degree AV block Vent. rate 52 BPM PR interval 268 ms QRS duration 98 ms QT/QTcB 448/416 ms  Echo 04/14/20 demonstrated   1. Left ventricular ejection fraction, by estimation, is 65 to 70%. The  left ventricle has normal function. The left ventricle has no regional  wall motion abnormalities. There is moderate concentric left ventricular  hypertrophy. Left ventricular  diastolic parameters are consistent with Grade I diastolic dysfunction  (impaired relaxation).   2. Right ventricular systolic function is normal. The right ventricular  size is normal. There is normal pulmonary artery systolic pressure. The  estimated right ventricular systolic pressure is 63.1 mmHg.   3. The mitral valve is normal in structure. No evidence of mitral valve  regurgitation. No evidence of mitral stenosis.   4. The aortic valve is normal in structure. Aortic valve regurgitation is  mild. No aortic stenosis is present.   5. The inferior vena cava is normal in size with greater than 50%  respiratory variability, suggesting right atrial pressure of 3 mmHg.   Epic records are reviewed at length today  CHA2DS2-VASc Score = 5  The patient's score is based upon: CHF History: 0 HTN History: 1 Diabetes History: 0 Stroke History: 0 Vascular Disease History: 1 Age Score: 2 Gender Score: 1       ASSESSMENT AND PLAN: 1. Paroxysmal Atrial Fibrillation (ICD10:  I48.0) The patient's CHA2DS2-VASc score is 5, indicating a 7.2% annual risk of stroke.   Patient appears to be maintaining SR. Continue sotalol 80 mg BID Recent bmet reviewed, check mag today.  2. Secondary Hypercoagulable State (ICD10:  D68.69) The patient is at significant risk for stroke/thromboembolism  based upon her CHA2DS2-VASc  Score of 5.  Continue Apixaban (Eliquis).   3. HTN Stable, no changes today.  4. CAD No anginal symptoms. SOB with exertion is very brief and infrequent. Patient has deferred any workup for now.    Follow up in the AF clinic in 6 months.    [facility] [address] [place], [place] [address] [phone]

## 2022-08-18 ENCOUNTER — Other Ambulatory Visit: Payer: Self-pay

## 2022-08-18 DIAGNOSIS — I480 Paroxysmal atrial fibrillation: Secondary | ICD-10-CM

## 2022-08-18 MED ORDER — APIXABAN 5 MG PO TABS: 5.0000 mg | ORAL_TABLET | Freq: Two times a day (BID) | ORAL | 11 refills | Status: DC

## 2022-08-18 NOTE — Telephone Encounter (Signed)
Prescription refill request for Eliquis received. Indication:Afib  Last office visit:08/06/22 ([place]) Scr: 1.10 (07/08/22)  Age: 82 Weight: 63.4kg  Appropriate dose and refill sent to requested pharmacy.

## 2022-08-20 ENCOUNTER — Telehealth: Payer: Self-pay | Admitting: Cardiology

## 2022-08-20 ENCOUNTER — Other Ambulatory Visit: Payer: Self-pay

## 2022-08-20 MED ORDER — MAGNESIUM OXIDE 400 MG PO TABS: 400.0000 mg | ORAL_TABLET | Freq: Two times a day (BID) | ORAL | 1 refills | Status: AC

## 2022-08-20 NOTE — Telephone Encounter (Signed)
*  STAT* If patient is at the pharmacy, call can be transferred to refill team.   1. Which medications need to be refilled? (please list name of each medication and dose if known)   magnesium oxide (MAG-OX) 400 MG tablet     2. Which pharmacy/location (including street and city if local pharmacy) is medication to be sent to?   [id number] - [address]   3. Do they need a 30 day or 90 day supply? [address]

## 2022-08-31 ENCOUNTER — Other Ambulatory Visit: Payer: Self-pay | Admitting: Rheumatology

## 2022-08-31 DIAGNOSIS — M359 Systemic involvement of connective tissue, unspecified: Secondary | ICD-10-CM

## 2022-08-31 DIAGNOSIS — Z79899 Other long term (current) drug therapy: Secondary | ICD-10-CM

## 2022-08-31 MED ORDER — HYDROXYCHLOROQUINE SULFATE 200 MG PO TABS: ORAL_TABLET | ORAL | 0 refills | Status: DC

## 2022-08-31 NOTE — Telephone Encounter (Signed)
Next Visit: 12/22/2022  Last Visit: 07/08/2022  Labs: 07/08/2022 CBC normal, Calcium is elevated , she should avoid calcium supplement, Cr is elevated due to the use of diuretics. Sed rate is mildly elevated and stable.Autoimmune labs are stable and do not indicate a disease flare.   Eye exam: 04/23/2022   Current Dose per office note on 07/08/2022: Plaquenil 200 mg 1 tablet by mouth BID Monday through Friday   UJ:[id number] disease   Last Fill: 05/31/2022  Okay to refill Plaquenil?

## 2022-08-31 NOTE — Telephone Encounter (Signed)
Patient called requesting prescription refill of Plaquenil (90 day supply) to be sent to [facility] [address]

## 2022-09-02 ENCOUNTER — Encounter (HOSPITAL_COMMUNITY): Payer: Self-pay | Admitting: *Deleted

## 2022-09-20 ENCOUNTER — Other Ambulatory Visit: Payer: Self-pay | Admitting: Rheumatology

## 2022-09-20 DIAGNOSIS — Z79899 Other long term (current) drug therapy: Secondary | ICD-10-CM

## 2022-09-20 DIAGNOSIS — M359 Systemic involvement of connective tissue, unspecified: Secondary | ICD-10-CM

## 2022-10-09 DIAGNOSIS — T63481A Toxic effect of venom of other arthropod, accidental (unintentional), initial encounter: Secondary | ICD-10-CM | POA: Diagnosis not present

## 2022-10-09 DIAGNOSIS — Z6823 Body mass index (BMI) 23.0-23.9, adult: Secondary | ICD-10-CM | POA: Diagnosis not present

## 2022-10-09 DIAGNOSIS — B001 Herpesviral vesicular dermatitis: Secondary | ICD-10-CM | POA: Diagnosis not present

## 2022-10-12 DIAGNOSIS — H31091 Other chorioretinal scars, right eye: Secondary | ICD-10-CM | POA: Diagnosis not present

## 2022-10-12 DIAGNOSIS — H43813 Vitreous degeneration, bilateral: Secondary | ICD-10-CM | POA: Diagnosis not present

## 2022-10-12 DIAGNOSIS — H35433 Paving stone degeneration of retina, bilateral: Secondary | ICD-10-CM | POA: Diagnosis not present

## 2022-10-12 DIAGNOSIS — H35373 Puckering of macula, bilateral: Secondary | ICD-10-CM | POA: Diagnosis not present

## 2022-10-12 DIAGNOSIS — H04123 Dry eye syndrome of bilateral lacrimal glands: Secondary | ICD-10-CM | POA: Diagnosis not present

## 2022-10-12 DIAGNOSIS — H353131 Nonexudative age-related macular degeneration, bilateral, early dry stage: Secondary | ICD-10-CM | POA: Diagnosis not present

## 2022-10-18 ENCOUNTER — Other Ambulatory Visit: Payer: Self-pay

## 2022-10-18 MED ORDER — AMLODIPINE BESYLATE 10 MG PO TABS: 10.0000 mg | ORAL_TABLET | Freq: Every day | ORAL | 1 refills | Status: DC

## 2022-11-04 ENCOUNTER — Other Ambulatory Visit: Payer: Self-pay | Admitting: Rheumatology

## 2022-11-04 DIAGNOSIS — M359 Systemic involvement of connective tissue, unspecified: Secondary | ICD-10-CM

## 2022-11-04 DIAGNOSIS — Z79899 Other long term (current) drug therapy: Secondary | ICD-10-CM

## 2022-11-05 NOTE — Telephone Encounter (Signed)
Last Fill: 08/31/2022  Eye exam: 04/23/2022 WNL,   Labs: 07/08/2022 CBC normal, Calcium is elevated Cr is elevated due to the use of diuretics. Sed rate is mildly elevated and stable.Autoimmune labs are stable and do not indicate a disease flare.   Next Visit: 12/22/2022  Last Visit: 07/08/2022  DX: Autoimmune disease   Current Dose per office note 07/08/2022: Plaquenil 200 mg 1 tablet by mouth BID Monday through Friday   Okay to refill Plaquenil?

## 2022-12-05 ENCOUNTER — Other Ambulatory Visit: Payer: Self-pay | Admitting: Physician Assistant

## 2022-12-05 DIAGNOSIS — Z79899 Other long term (current) drug therapy: Secondary | ICD-10-CM

## 2022-12-05 DIAGNOSIS — M359 Systemic involvement of connective tissue, unspecified: Secondary | ICD-10-CM

## 2022-12-08 NOTE — Progress Notes (Signed)
Office Visit Note  Patient: Alice Medina             Date of Birth: 05/12/1941           MRN: [id number]             PCP: Creola Corn, MD Referring: Creola Corn, MD Visit Date: 12/22/2022 Occupation: @GUAROCC @  Subjective:  Pain in multiple joints  History of Present Illness: Alice Medina is a 82 y.o. female with history of Sjogren's, osteoarthritis and osteoporosis.  She states she rolled her foot on May 24 and was [facility].  She had x-ray of her left foot which showed possible left fifth metatarsal fracture.  She was given a boot which she has been wearing on a regular basis.  She continues to have discomfort in her right arm and right shoulder.  She was also evaluated for her right shoulder joint pain at [facility].  She denies any discomfort in her hands today.  She continues to have some discomfort over the left trochanteric bursa.  She states her knee joints are feeling better since she has been doing gardening.  She continues to have dry mouth and dry eye symptoms.  She continues to have sores in her mouth and her nose.  She denies any increased shortness of breath.  She continues to have Raynauds symptoms.    Activities of Daily Living:  Patient reports morning stiffness for 15-30 minutes.   Patient Reports nocturnal pain.  Difficulty dressing/grooming: Denies Difficulty climbing stairs: Reports Difficulty getting out of chair: Reports Difficulty using hands for taps, buttons, cutlery, and/or writing: Denies  Review of Systems  Constitutional:  Positive for fatigue.  HENT:  Positive for mouth sores and mouth dryness.        Nose sores  Eyes:  Positive for dryness.  Respiratory:  Positive for shortness of breath.   Cardiovascular:  Negative for chest pain and palpitations.  Gastrointestinal:  Positive for constipation and diarrhea. Negative for blood in stool.  Endocrine: Negative for increased urination.  Genitourinary:  Positive for involuntary  urination.  Musculoskeletal:  Positive for joint pain, gait problem, joint pain, myalgias, muscle weakness, morning stiffness, muscle tenderness and myalgias. Negative for joint swelling.  Skin:  Positive for color change. Negative for rash, hair loss and sensitivity to sunlight.  Allergic/Immunologic: Negative for susceptible to infections.  Neurological:  Positive for headaches. Negative for dizziness.  Hematological:  Negative for swollen glands.  Psychiatric/Behavioral:  Negative for depressed mood and sleep disturbance. The patient is not nervous/anxious.     PMFS History:  Patient Active Problem List   Diagnosis Date Noted   Paroxysmal atrial fibrillation (HCC) 04/07/2020   Hypercoagulable state due to paroxysmal atrial fibrillation (HCC) 04/07/2020   Multiple closed fractures of metacarpal bone 07/25/2017   Autoimmune disease (HCC) +ANA +Ro +La +RF oral ulcers nasal ulcers Raynaud photosensitivity [facility]  01/13/2017   High risk medication use 01/13/2017   Photosensitivity 01/13/2017   Sjogren's syndrome with keratoconjunctivitis sicca (HCC) 01/13/2017   Recurrent oral ulcers and nasal ulcers  01/13/2017   Raynaud's disease without gangrene 01/13/2017   Primary osteoarthritis of both hands 01/13/2017   Primary osteoarthritis of both knees 01/13/2017   Primary osteoarthritis of both feet 01/13/2017   Osteopenia of multiple sites 01/13/2017   History of hypertension 01/13/2017   History of hypothyroidism 01/13/2017   History of gastroesophageal reflux (GERD)/ PUD 01/13/2017   History of MRSA infection 01/13/2017   Diarrhea 05/28/2013  Family history of malignant neoplasm of gastrointestinal tract 06/22/2011   ADENOCARCINOMA, BREAST 05/02/2009   CORONARY ARTERY DISEASE 05/02/2009   DYSPHAGIA UNSPECIFIED 05/02/2009    Past Medical History:  Diagnosis Date   ADENOCARCINOMA, BREAST 05/02/2009   Qualifier: Diagnosis of  By: Arlyce Dice MD, Barbette Hair    Anxiety    Arthritis     Autoimmune disorder (HCC)    SJORGREN'S   Breast cancer (HCC)    right breast   Chronic headaches    CORONARY ARTERY DISEASE    cath in 1990 (normal Left main, normal CFX, normal RCA, myocardial bridge in LAD per Dr. York Spaniel note)   Depression    Diarrhea 05/28/2013   DYSPHAGIA UNSPECIFIED 05/02/2009   Qualifier: Diagnosis of  By: Arlyce Dice MD, Barbette Hair    Family history of malignant neoplasm of gastrointestinal tract 06/22/2011   Brother with colon cancer    GERD (gastroesophageal reflux disease)    Hemorrhage of rectum and anus 06/22/2011   History of hypothyroidism 01/13/2017   History of MRSA infection 01/13/2017   Hypertension    Multiple closed fractures of metacarpal bone 07/25/2017   Myocardial bridge    Myocardial infarction (HCC)    1990   Neuromuscular disorder (HCC)    LUPUS   Osteopenia of multiple sites 01/13/2017   Osteoporosis    PAC (premature atrial contraction) 04/07/2020   PAF (paroxysmal atrial fibrillation) (HCC)    Photosensitivity 01/13/2017   Primary osteoarthritis of both hands 01/13/2017   Raynaud's disease without gangrene 01/13/2017   Recurrent oral ulcers and nasal ulcers  01/13/2017   Sjogren's syndrome with keratoconjunctivitis sicca (HCC) 01/13/2017   Status post dilation of esophageal narrowing    Thyroid disease    Ulcer     Family History  Problem Relation Age of Onset   Breast cancer Mother    Lung cancer Mother    Cancer Father        larynx   Heart failure Father    Heart disease Brother    Colon cancer Brother    Pseudochol deficiency Other    Breast cancer Sister    Cancer Sister        thyroid    Breast cancer Maternal Grandmother    Autoimmune disease Daughter    Cancer Son        prostate    Diabetes Son    Rheum arthritis Grandchild    Esophageal cancer Neg Hx    Rectal cancer Neg Hx    Stomach cancer Neg Hx    Past Surgical History:  Procedure Laterality Date   APPENDECTOMY     breast reconstuction     x 2    CHOLECYSTECTOMY     COLONOSCOPY     double mastectomy     PARTIAL HYSTERECTOMY  1990   TIBIA FRACTURE SURGERY Right    with subsequent hardware removal   UPPER GASTROINTESTINAL ENDOSCOPY     WRIST FRACTURE SURGERY Left    Social History   Social History Narrative   1 cup of coffee daily   Immunization History  Administered Date(s) Administered   Influenza Split 05/07/2010, 05/14/2011, 05/12/2012, 04/19/2013, 04/15/2014   Influenza, Quadrivalent, Recombinant, Inj, Pf 04/22/2018, 04/07/2019, 05/10/2020   Influenza,inj,Quad PF,6+ Mos 04/15/2014, 05/19/2015   Influenza-Unspecified 04/24/2016, 04/23/2017   PFIZER(Purple Top)SARS-COV-2 Vaccination 09/03/2019, 09/28/2019, 06/27/2020   Pneumococcal Conjugate-13 11/20/2013, 12/24/2014   Pneumococcal Polysaccharide-23 09/08/2007, 01/06/2018   Td,absorbed, Preservative Free, Adult Use, Lf Unspecified 04/13/2011   Tdap 10/29/2009  Tetanus 04/03/2022   Unspecified SARS-COV-2 Vaccination 09/03/2019, 09/28/2019   Zoster Recombinat (Shingrix) 01/10/2018, 05/11/2018, 09/19/2018   Zoster, Live 11/20/2013, 01/13/2018, 05/11/2018     Objective: Vital Signs: BP 126/69 (BP Location: Left Arm, Patient Position: Sitting, Cuff Size: Normal)   Pulse 60   Resp 13   Ht 5' 4.5" (1.638 m)   Wt 133 lb 3.2 oz (60.4 kg)   BMI 22.51 kg/m    Physical Exam Vitals and nursing note reviewed.  Constitutional:      Appearance: She is well-developed.  HENT:     Head: Normocephalic and atraumatic.  Eyes:     Conjunctiva/sclera: Conjunctivae normal.  Cardiovascular:     Rate and Rhythm: Normal rate and regular rhythm.     Heart sounds: Normal heart sounds.  Pulmonary:     Effort: Pulmonary effort is normal.     Breath sounds: Normal breath sounds.  Abdominal:     General: Bowel sounds are normal.     Palpations: Abdomen is soft.  Musculoskeletal:     Cervical back: Normal range of motion.  Lymphadenopathy:     Cervical: No cervical adenopathy.   Skin:    General: Skin is warm and dry.     Capillary Refill: Capillary refill takes less than 2 seconds.  Neurological:     Mental Status: She is alert and oriented to person, place, and time.  Psychiatric:        Behavior: Behavior normal.      Musculoskeletal Exam: Cervical spine was in good range of motion.  She had no tenderness over thoracic or lumbar spine.  Shoulder joints, elbow joints, wrist joints with good range of motion.  She had subluxation of her left wrist was noted.  Bilateral PIP and DIP thickening was noted.  Hip joints and knee joints were in good range of motion.  Her left foot was in a boot.  CDAI Exam: CDAI Score: -- Patient Global: --; Provider Global: -- Swollen: --; Tender: -- Joint Exam 12/22/2022   No joint exam has been documented for this visit   There is currently no information documented on the homunculus. Go to the Rheumatology activity and complete the homunculus joint exam.  Investigation: No additional findings.  Imaging: XR Shoulder Right  Result Date: 12/17/2022 Radiographs of her right shoulder were obtained today.  Humeral head is placed well in the glenoid fossa.  No evidence of any fracture very little degenerative changes no humeral head elevation  XR Foot Complete Left  Result Date: 12/17/2022 Radiograph images of her left foot demonstrate overall well-maintained alignment.  She does have quite a bit of osteopenia and midfoot arthritis.  Cannot rule out a base of fifth metatarsal fracture nondisplaced.  Would coordinate with exam   Recent Labs: Lab Results  Component Value Date   WBC 5.0 07/08/2022   HGB 12.9 07/08/2022   PLT 192 07/08/2022   NA 135 07/08/2022   K 4.8 07/08/2022   CL 102 07/08/2022   CO2 25 07/08/2022   GLUCOSE 90 07/08/2022   BUN 22 07/08/2022   CREATININE 1.10 (H) 07/08/2022   BILITOT 0.4 07/08/2022   ALKPHOS 79 12/13/2017   AST 32 07/08/2022   ALT 19 07/08/2022   PROT 7.5 07/08/2022   ALBUMIN 3.6  12/13/2017   CALCIUM 10.6 (H) 07/08/2022   GFRAA 81 10/16/2020    Speciality Comments: PLQ Eye Exam: 04/23/2022 WNL, Patient has macular degeneration which confounds for PLQ toxicity, probably no toxicity but hard to say.  Patient to follow up with her Retina specialist.   Procedures:  No procedures performed Allergies: Codeine, Robinul [glycopyrrolate], and Sulfamethoxazole-trimethoprim   Assessment / Plan:     Visit Diagnoses: Autoimmune disease (HCC) - +ANA +Ro +La +RF oral ulcers nasal ulcers Raynaud photosensitivity, sicca: -She continues to have dry mouth and dry eye symptoms.  She has intermittent Raynauds symptoms.  She complains of oral ulcers and nasal ulcers.  Will obtain labs today.  Plan: Urinalysis, Routine w reflex microscopic, Anti-DNA antibody, double-stranded, C3 and C4, Sedimentation rate, Serum protein electrophoresis with reflex  Sjogren's syndrome with keratoconjunctivitis sicca (HCC) - sicca symptoms, positive Ro, positive loss antibody.  Rheumatoid factor has been negative.  Over-the-counter products were discussed.  High risk medication use - Plaquenil 200 mg 1 tablet by mouth BID Monday through Friday. PLQ Eye Exam: 04/23/2022. - Plan: CBC with Differential/Platelet, patient had CMP with GFR with her PCP last week.  CANCELED: COMPLETE METABOLIC PANEL WITH GFR  Raynaud's disease without gangrene-keeping core temperature warm was discussed.  Chronic right shoulder pain-she was recently evaluated by orthopedics.  She continues to have some right shoulder joint discomfort.  Primary osteoarthritis of both hands-bilateral PIP and DIP thickening was noted.  Joint protection muscle strengthening was discussed.  Trochanteric bursitis, left hip-IT band stretches were discussed.  Primary osteoarthritis of both knees-she denies any discomfort today.  Primary osteoarthritis of both feet-she recently twisted her left foot and had possible left fifth metatarsal fracture.  She is  in a boot currently.  History of foot fracture - Closed fracture of left calcaneus-11/16/21.  Patient states she had good recovery.  Age-related osteoporosis without current pathological fracture - 12/14/18 DEXA BMD 0.523 with T-score -2.9 1/3 right forearm.  DEXA on 12/15/2020-results unclear on scanned copy in epic.  Patient will get repeat DEXA scan in July.  Will discuss treatment options after the DEXA scan results.  Vitamin D deficiency-she is on vitamin D supplement.  History of gastroesophageal reflux (GERD)/ PUD  History of hypertension-her blood pressure was 126/69.  History of atrial fibrillation  History of MRSA infection  History of hypothyroidism  Orders: Orders Placed This Encounter  Procedures   CBC with Differential/Platelet   Urinalysis, Routine w reflex microscopic   Anti-DNA antibody, double-stranded   C3 and C4   Sedimentation rate   Serum protein electrophoresis with reflex   No orders of the defined types were placed in this encounter.    Follow-Up Instructions: Return in about 4 months (around 04/24/2023) for Autoimmune disease, Osteoarthritis.   Pollyann Savoy, MD  Note - This record has been created using Animal nutritionist.  Chart creation errors have been sought, but may not always  have been located. Such creation errors do not reflect on  the standard of medical care.

## 2022-12-14 DIAGNOSIS — N1831 Chronic kidney disease, stage 3a: Secondary | ICD-10-CM | POA: Diagnosis not present

## 2022-12-14 DIAGNOSIS — D6869 Other thrombophilia: Secondary | ICD-10-CM | POA: Diagnosis not present

## 2022-12-14 DIAGNOSIS — E039 Hypothyroidism, unspecified: Secondary | ICD-10-CM | POA: Diagnosis not present

## 2022-12-14 DIAGNOSIS — M3500 Sicca syndrome, unspecified: Secondary | ICD-10-CM | POA: Diagnosis not present

## 2022-12-14 DIAGNOSIS — D72819 Decreased white blood cell count, unspecified: Secondary | ICD-10-CM | POA: Diagnosis not present

## 2022-12-14 DIAGNOSIS — M3210 Systemic lupus erythematosus, organ or system involvement unspecified: Secondary | ICD-10-CM | POA: Diagnosis not present

## 2022-12-14 DIAGNOSIS — F325 Major depressive disorder, single episode, in full remission: Secondary | ICD-10-CM | POA: Diagnosis not present

## 2022-12-14 DIAGNOSIS — D899 Disorder involving the immune mechanism, unspecified: Secondary | ICD-10-CM | POA: Diagnosis not present

## 2022-12-14 DIAGNOSIS — M797 Fibromyalgia: Secondary | ICD-10-CM | POA: Diagnosis not present

## 2022-12-14 DIAGNOSIS — F1320 Sedative, hypnotic or anxiolytic dependence, uncomplicated: Secondary | ICD-10-CM | POA: Diagnosis not present

## 2022-12-14 DIAGNOSIS — I1310 Hypertensive heart and chronic kidney disease without heart failure, with stage 1 through stage 4 chronic kidney disease, or unspecified chronic kidney disease: Secondary | ICD-10-CM | POA: Diagnosis not present

## 2022-12-14 LAB — COMPREHENSIVE METABOLIC PANEL: EGFR: 63

## 2022-12-17 ENCOUNTER — Telehealth: Payer: Self-pay | Admitting: *Deleted

## 2022-12-17 ENCOUNTER — Other Ambulatory Visit (INDEPENDENT_AMBULATORY_CARE_PROVIDER_SITE_OTHER): Payer: PPO

## 2022-12-17 ENCOUNTER — Ambulatory Visit (INDEPENDENT_AMBULATORY_CARE_PROVIDER_SITE_OTHER): Payer: PPO | Admitting: Physician Assistant

## 2022-12-17 ENCOUNTER — Encounter: Payer: Self-pay | Admitting: Physician Assistant

## 2022-12-17 DIAGNOSIS — M79672 Pain in left foot: Secondary | ICD-10-CM

## 2022-12-17 DIAGNOSIS — M25511 Pain in right shoulder: Secondary | ICD-10-CM

## 2022-12-17 DIAGNOSIS — G8929 Other chronic pain: Secondary | ICD-10-CM

## 2022-12-17 NOTE — Progress Notes (Signed)
Office Visit Note   Patient: Alice Medina           Date of Birth: 06/13/1941           MRN: [id number] Visit Date: 12/17/2022              Requested by: Creola Corn, MD [address] [place],  [place] [address] PCP: Creola Corn, MD   Assessment & Plan: Visit Diagnoses:  1. Chronic right shoulder pain   2. Pain in left foot     Plan: And was referred today for evaluation of right shoulder pain.  She states she had a history of a torn ligament in her shoulder years ago.  She denies any weakness but began having significant pain in the front of the shoulder and into her biceps muscle about 3 weeks ago.  Denies any specific injury.  Denies any ecchymosis.  She admits that since 3 weeks ago its gotten a lot better and it is not terribly painful to her at this point.  Exam so she has good strength in all planes.  She does have some pain in the biceps with speeds testing.  Also tender over the proximal biceps tendon.  Not really tender distally and has a good palpable tendon.  May be compared to the other to hide a slight Popeye deformity.  She is really not tender today so we discussed injections she will see how she does over the next couple weeks as she continues to get better unfortunately last night she had a injury to her left foot.  She does have some lateral ecchymosis and is particularly tender with resisted eversion and over the base of the fifth metatarsal x-rays do suggest that this may be an actual injury.  She does have a boot before because unfortunately she has had injuries to this left foot.  I advised that she go into this boot for a couple weeks and we will take another x-ray when she returns in 2 weeks  Follow-Up Instructions: Return in about 2 weeks (around 12/31/2022).   Orders:  Orders Placed This Encounter  Procedures   XR Shoulder Right   XR Foot Complete Left   No orders of the defined types were placed in this encounter.     Procedures: No procedures  performed   Clinical Data: No additional findings.   Subjective: Chief Complaint  Patient presents with   Right Shoulder - Pain    HPI Patient is a pleasant 82 year old woman referred by Dr. Timothy Lasso.  She has a history of right shoulder pain no known injury.  She has a history of what she describes as a torn ligament in her right shoulder was referred for possible steroid injection.  She also unfortunately fell last evening is complaining of left foot pain lost her balance.  No previous history of injury describes her pain is moderate  Review of Systems  All other systems reviewed and are negative.    Objective: Vital Signs: There were no vitals taken for this visit.  Physical Exam Constitutional:      Appearance: Normal appearance.  Skin:    General: Skin is warm and dry.  Neurological:     General: No focal deficit present.     Mental Status: She is alert.  Psychiatric:        Mood and Affect: Mood normal.     Ortho Exam Right shoulder she has full forward elevation without pain internal rotation behind her back.  She has  mild pain with empty can testing and a little bit more pain with speeds testing reproduces pain in her anterior shoulder and biceps.  No pain with external rotation and is only mildly stiff.  She has good strength with resisted abduction external and internal rotation.  She is neurovascularly intact with good grip strength Examination of her left foot is warm palpable pulse.  She does have some ecchymosis over the lateral foot.  She has good dorsiflexion and plantarflexion and inversion.  Eversion is mildly weak secondary to pain.  She is focally tender over the base of the fifth metatarsal Specialty Comments:  No specialty comments available.  Imaging: XR Shoulder Right  Result Date: 12/17/2022 Radiographs of her right shoulder were obtained today.  Humeral head is placed well in the glenoid fossa.  No evidence of any fracture very little degenerative  changes no humeral head elevation  XR Foot Complete Left  Result Date: 12/17/2022 Radiograph images of her left foot demonstrate overall well-maintained alignment.  She does have quite a bit of osteopenia and midfoot arthritis.  Cannot rule out a base of fifth metatarsal fracture nondisplaced.  Would coordinate with exam    PMFS History: Patient Active Problem List   Diagnosis Date Noted   Paroxysmal atrial fibrillation (HCC) 04/07/2020   Hypercoagulable state due to paroxysmal atrial fibrillation (HCC) 04/07/2020   Multiple closed fractures of metacarpal bone 07/25/2017   Autoimmune disease (HCC) +ANA +Ro +La +RF oral ulcers nasal ulcers Raynaud photosensitivity SICCA  01/13/2017   High risk medication use 01/13/2017   Photosensitivity 01/13/2017   Sjogren's syndrome with keratoconjunctivitis sicca (HCC) 01/13/2017   Recurrent oral ulcers and nasal ulcers  01/13/2017   Raynaud's disease without gangrene 01/13/2017   Primary osteoarthritis of both hands 01/13/2017   Primary osteoarthritis of both knees 01/13/2017   Primary osteoarthritis of both feet 01/13/2017   Osteopenia of multiple sites 01/13/2017   History of hypertension 01/13/2017   History of hypothyroidism 01/13/2017   History of gastroesophageal reflux (GERD)/ PUD 01/13/2017   History of MRSA infection 01/13/2017   Diarrhea 05/28/2013   Family history of malignant neoplasm of gastrointestinal tract 06/22/2011   ADENOCARCINOMA, BREAST 05/02/2009   CORONARY ARTERY DISEASE 05/02/2009   DYSPHAGIA UNSPECIFIED 05/02/2009   Past Medical History:  Diagnosis Date   ADENOCARCINOMA, BREAST 05/02/2009   Qualifier: Diagnosis of  By: Arlyce Dice MD, Barbette Hair    Anxiety    Arthritis    Autoimmune disorder (HCC)    SJORGREN'S   Breast cancer (HCC)    right breast   Chronic headaches    CORONARY ARTERY DISEASE    cath in 1990 (normal Left main, normal CFX, normal RCA, myocardial bridge in LAD per Dr. York Spaniel note)   Depression     Diarrhea 05/28/2013   DYSPHAGIA UNSPECIFIED 05/02/2009   Qualifier: Diagnosis of  By: Arlyce Dice MD, Barbette Hair    Family history of malignant neoplasm of gastrointestinal tract 06/22/2011   Brother with colon cancer    GERD (gastroesophageal reflux disease)    Hemorrhage of rectum and anus 06/22/2011   History of hypothyroidism 01/13/2017   History of MRSA infection 01/13/2017   Hypertension    Multiple closed fractures of metacarpal bone 07/25/2017   Myocardial bridge    Myocardial infarction [facility])    1990   Neuromuscular disorder (HCC)    LUPUS   Osteopenia of multiple sites 01/13/2017   Osteoporosis    PAC (premature atrial contraction) 04/07/2020   PAF (paroxysmal atrial fibrillation) (  HCC)    Photosensitivity 01/13/2017   Primary osteoarthritis of both hands 01/13/2017   Raynaud's disease without gangrene 01/13/2017   Recurrent oral ulcers and nasal ulcers  01/13/2017   Sjogren's syndrome with keratoconjunctivitis sicca (HCC) 01/13/2017   Status post dilation of esophageal narrowing    Thyroid disease    Ulcer     Family History  Problem Relation Age of Onset   Breast cancer Mother    Lung cancer Mother    Cancer Father        larynx   Heart failure Father    Heart disease Brother    Colon cancer Brother    Pseudochol deficiency Other    Breast cancer Sister    Cancer Sister        thyroid    Breast cancer Maternal Grandmother    Autoimmune disease Daughter    Cancer Son        prostate    Diabetes Son    Rheum arthritis Grandchild    Esophageal cancer Neg Hx    Rectal cancer Neg Hx    Stomach cancer Neg Hx     Past Surgical History:  Procedure Laterality Date   APPENDECTOMY     breast reconstuction     x 2   CHOLECYSTECTOMY     COLONOSCOPY     double mastectomy     PARTIAL HYSTERECTOMY  1990   TIBIA FRACTURE SURGERY Right    with subsequent hardware removal   UPPER GASTROINTESTINAL ENDOSCOPY     WRIST FRACTURE SURGERY Left    Social History    Occupational History   Occupation: retired Public house manager  Tobacco Use   Smoking status: Never    Passive exposure: Past   Smokeless tobacco: Never  Vaping Use   Vaping Use: Never used  Substance and Sexual Activity   Alcohol use: Not Currently   Drug use: Never   Sexual activity: Not on file

## 2022-12-17 NOTE — Telephone Encounter (Signed)
Labs received from:[facility]  Drawn on:12/14/2022  Reviewed by:Sherron Ales, PA-C  Labs drawn:TSH, Magnesium, Free T4, CBC, CMP  Results:RBC 4.0  MCV 102.5  MCH 32.6   Sodium 133  Calcium 11.4  A/G Ratio 1.1  Patient is on PLQ 200 mg po twice daily M-F.

## 2022-12-22 ENCOUNTER — Encounter: Payer: Self-pay | Admitting: Rheumatology

## 2022-12-22 ENCOUNTER — Ambulatory Visit: Payer: PPO | Attending: Rheumatology | Admitting: Rheumatology

## 2022-12-22 VITALS — BP 126/69 | HR 60 | Resp 13 | Ht 64.5 in | Wt 133.2 lb

## 2022-12-22 DIAGNOSIS — M19042 Primary osteoarthritis, left hand: Secondary | ICD-10-CM

## 2022-12-22 DIAGNOSIS — Z8639 Personal history of other endocrine, nutritional and metabolic disease: Secondary | ICD-10-CM

## 2022-12-22 DIAGNOSIS — Z79899 Other long term (current) drug therapy: Secondary | ICD-10-CM | POA: Diagnosis not present

## 2022-12-22 DIAGNOSIS — Z8719 Personal history of other diseases of the digestive system: Secondary | ICD-10-CM

## 2022-12-22 DIAGNOSIS — E559 Vitamin D deficiency, unspecified: Secondary | ICD-10-CM | POA: Diagnosis not present

## 2022-12-22 DIAGNOSIS — Z8679 Personal history of other diseases of the circulatory system: Secondary | ICD-10-CM

## 2022-12-22 DIAGNOSIS — M3501 Sicca syndrome with keratoconjunctivitis: Secondary | ICD-10-CM

## 2022-12-22 DIAGNOSIS — I7300 Raynaud's syndrome without gangrene: Secondary | ICD-10-CM | POA: Diagnosis not present

## 2022-12-22 DIAGNOSIS — M25511 Pain in right shoulder: Secondary | ICD-10-CM

## 2022-12-22 DIAGNOSIS — M170 Bilateral primary osteoarthritis of knee: Secondary | ICD-10-CM

## 2022-12-22 DIAGNOSIS — M19071 Primary osteoarthritis, right ankle and foot: Secondary | ICD-10-CM

## 2022-12-22 DIAGNOSIS — Z8781 Personal history of (healed) traumatic fracture: Secondary | ICD-10-CM | POA: Diagnosis not present

## 2022-12-22 DIAGNOSIS — M359 Systemic involvement of connective tissue, unspecified: Secondary | ICD-10-CM

## 2022-12-22 DIAGNOSIS — Z8614 Personal history of Methicillin resistant Staphylococcus aureus infection: Secondary | ICD-10-CM

## 2022-12-22 DIAGNOSIS — M810 Age-related osteoporosis without current pathological fracture: Secondary | ICD-10-CM

## 2022-12-22 DIAGNOSIS — M19072 Primary osteoarthritis, left ankle and foot: Secondary | ICD-10-CM

## 2022-12-22 DIAGNOSIS — M7062 Trochanteric bursitis, left hip: Secondary | ICD-10-CM | POA: Diagnosis not present

## 2022-12-22 DIAGNOSIS — M19041 Primary osteoarthritis, right hand: Secondary | ICD-10-CM | POA: Diagnosis not present

## 2022-12-22 DIAGNOSIS — G8929 Other chronic pain: Secondary | ICD-10-CM

## 2022-12-22 LAB — CBC WITH DIFFERENTIAL/PLATELET
Hemoglobin: 13.2 g/dL (ref 11.7–15.5)
MCV: 96.4 fL (ref 80.0–100.0)
MPV: 10 fL (ref 7.5–12.5)
Neutrophils Relative %: 55.8 %

## 2022-12-23 DIAGNOSIS — Z79899 Other long term (current) drug therapy: Secondary | ICD-10-CM | POA: Diagnosis not present

## 2022-12-23 DIAGNOSIS — M359 Systemic involvement of connective tissue, unspecified: Secondary | ICD-10-CM | POA: Diagnosis not present

## 2022-12-23 LAB — CBC WITH DIFFERENTIAL/PLATELET
Absolute Monocytes: 461 cells/uL (ref 200–950)
Basophils Relative: 0.4 %
Eosinophils Absolute: 69 cells/uL (ref 15–500)
Eosinophils Relative: 1.3 %
HCT: 40.3 % (ref 35.0–45.0)
MCHC: 32.8 g/dL (ref 32.0–36.0)
Monocytes Relative: 8.7 %
Total Lymphocyte: 33.8 %
WBC: 5.3 10*3/uL (ref 3.8–10.8)

## 2022-12-23 LAB — C3 AND C4: C3 Complement: 132 mg/dL

## 2022-12-23 LAB — SEDIMENTATION RATE: Sed Rate: 39 mm/h — ABNORMAL HIGH (ref 0–30)

## 2022-12-24 LAB — URINALYSIS, ROUTINE W REFLEX MICROSCOPIC
Bilirubin Urine: NEGATIVE
Glucose, UA: NEGATIVE
Hgb urine dipstick: NEGATIVE
Ketones, ur: NEGATIVE
Leukocytes,Ua: NEGATIVE
Nitrite: NEGATIVE
Protein, ur: NEGATIVE
Specific Gravity, Urine: 1.011 (ref 1.001–1.035)
pH: 6.5 (ref 5.0–8.0)

## 2022-12-25 LAB — PROTEIN ELECTROPHORESIS, SERUM, WITH REFLEX
Gamma Globulin: 1.4 g/dL (ref 0.8–1.7)
Total Protein: 7.4 g/dL (ref 6.1–8.1)

## 2022-12-25 LAB — CBC WITH DIFFERENTIAL/PLATELET
Neutro Abs: 2957 cells/uL (ref 1500–7800)
Platelets: 242 10*3/uL (ref 140–400)
RDW: 12.2 % (ref 11.0–15.0)

## 2022-12-25 LAB — ANTI-DNA ANTIBODY, DOUBLE-STRANDED: ds DNA Ab: <1 IU/mL

## 2022-12-28 LAB — C3 AND C4: C4 Complement: 25 mg/dL

## 2022-12-28 LAB — CBC WITH DIFFERENTIAL/PLATELET
Basophils Absolute: 21 cells/uL (ref 0–200)
Lymphs Abs: 1791 cells/uL (ref 850–3900)
MCH: 31.6 pg (ref 27.0–33.0)
RBC: 4.18 10*6/uL (ref 3.80–5.10)

## 2022-12-28 LAB — PROTEIN ELECTROPHORESIS, SERUM, WITH REFLEX
Albumin ELP: 3.9 g/dL (ref 3.8–4.8)
Alpha 1: 0.3 g/dL (ref 0.2–0.3)
Alpha 2: 0.8 g/dL (ref 0.5–0.9)
Beta 2: 0.6 g/dL — ABNORMAL HIGH (ref 0.2–0.5)
Beta Globulin: 0.5 g/dL (ref 0.4–0.6)

## 2022-12-28 LAB — IFE INTERPRETATION

## 2022-12-28 NOTE — Progress Notes (Signed)
Sed rate states elevated and stable.  SPEP is nonspecific and consistent with autoimmune disease.  CBC is normal.  IFE normal.  UA negative.  Complements normal and double-stranded DNA negative.  Labs do not indicate an autoimmune disease flare.  No change in treatment advised.

## 2023-01-04 ENCOUNTER — Telehealth: Payer: Self-pay | Admitting: Cardiology

## 2023-01-04 NOTE — Telephone Encounter (Signed)
Spoke with the patient, she is scheduled 6 mt f/u with the Afib clinic. Pt wanted to clarify should if its recommended for f/u with APP since she has scheduled f/u with Afib clinic. Advised pt according to Ronie Spies, PA OV note from 02/03/22  Disposition: F/u nurse BP check in 1 week (anticipate adding spironolactone once labs return), afib clinic in 6 months, and me or Dr. Anne Fu in 1 year.   Scheduled pt with Ronie Spies, PA on 02/10/23 @ 1005, patient voiced understanding.

## 2023-01-04 NOTE — Telephone Encounter (Signed)
Pt would like a callback regarding f/u appt with our office as well as [facility] Clinic. Please advise

## 2023-01-07 ENCOUNTER — Other Ambulatory Visit: Payer: Self-pay | Admitting: Physician Assistant

## 2023-01-07 DIAGNOSIS — M359 Systemic involvement of connective tissue, unspecified: Secondary | ICD-10-CM

## 2023-01-07 DIAGNOSIS — Z79899 Other long term (current) drug therapy: Secondary | ICD-10-CM

## 2023-01-11 ENCOUNTER — Encounter (HOSPITAL_BASED_OUTPATIENT_CLINIC_OR_DEPARTMENT_OTHER): Payer: Self-pay | Admitting: Physician Assistant

## 2023-01-11 ENCOUNTER — Ambulatory Visit (INDEPENDENT_AMBULATORY_CARE_PROVIDER_SITE_OTHER): Payer: PPO | Admitting: Physician Assistant

## 2023-01-11 ENCOUNTER — Ambulatory Visit (HOSPITAL_BASED_OUTPATIENT_CLINIC_OR_DEPARTMENT_OTHER): Payer: PPO

## 2023-01-11 DIAGNOSIS — M79672 Pain in left foot: Secondary | ICD-10-CM

## 2023-01-11 DIAGNOSIS — S92025A Nondisplaced fracture of anterior process of left calcaneus, initial encounter for closed fracture: Secondary | ICD-10-CM | POA: Diagnosis not present

## 2023-01-11 NOTE — Progress Notes (Signed)
Office Visit Note   Patient: Alice Medina           Date of Birth: December 27, 1940           MRN: [id number] Visit Date: 01/11/2023              Requested by: Creola Corn, MD [address] [place],  [place] [address] PCP: Creola Corn, MD  Chief Complaint  Patient presents with   Right Shoulder - Follow-up   Left Foot - Follow-up      HPI: Ms. Melling is a pleasant 82 year old woman who is 3 weeks status post injury to her left foot and right shoulder.  X-rays at that time suggested small avulsion fracture nondisplaced at the base of the fifth metatarsal.  This also coordinated with her symptoms.  She was placed in a cam walker boot.  She also has some shoulder pain.  She reports today both are much better.  She occasionally gets some soreness in her trapezius but nothing bad and her motion in her shoulder has improved.  Ecchymosis on the lateral side of her left foot is also resolved  Assessment & Plan: Visit Diagnoses:  1. Pain in left foot     Plan: X-rays were reviewed today of her left foot demonstrate well-maintained alignment she does have chronic degenerative changes of the TMT joints.  Base of fifth moderate metatarsal shows very small avulsion fracture continues to be nondisplaced and healing well I had a discussion with her today we talked about that I would like her to stay in the boot for 1 more week she can then transition to a stiff shoe.  As far as her shoulder she is much better than before we talked about topical anti-inflammatories and massage.  She is neurovascularly intact.  She has good strength.  She may follow-up with me as needed  Follow-Up Instructions: Return if symptoms worsen or fail to improve.   Ortho Exam  Patient is alert, oriented, no adenopathy, well-dressed, normal affect, normal respiratory effort. Left foot she has no ecchymosis no swelling no erythema toes are warm and pink she has palpable dorsalis pedis pulse.  She has mild tenderness over  the base of the fifth metatarsal no tenderness over ankle or midfoot or with manipulation of the Lisfranc joint she has strong inversion eversion plantarflexion and dorsiflexion Examination of her shoulder she has full forward elevation has good biceps triceps strength has minimal tenderness and tightness over the trapezius.  No paresthesias  Imaging: No results found. No images are attached to the encounter.  Labs: Lab Results  Component Value Date   ESRSEDRATE 39 (H) 12/22/2022   ESRSEDRATE 34 (H) 07/08/2022   ESRSEDRATE 38 (H) 01/19/2022   REPTSTATUS 12/14/2017 FINAL 12/13/2017   CULT (A) 12/13/2017    <10,000 COLONIES/mL INSIGNIFICANT GROWTH Performed at [facility], [address], [place], [place] [address]      Lab Results  Component Value Date   ALBUMIN 3.6 12/13/2017   ALBUMIN 3.4 (L) 08/18/2016   ALBUMIN 3.2 (L) 03/28/2016   PREALBUMIN 26 12/13/2019    Lab Results  Component Value Date   MG 2.0 08/06/2022   MG 2.0 02/03/2022   MG 1.9 01/19/2022   Lab Results  Component Value Date   VD25OH 69 01/19/2022    Lab Results  Component Value Date   PREALBUMIN 26 12/13/2019      Latest Ref Rng & Units 12/22/2022    2:06 PM 07/08/2022  1:33 PM 01/19/2022    8:53 AM  CBC EXTENDED  WBC 3.8 - 10.8 Thousand/uL 5.3  5.0  4.3   RBC 3.80 - 5.10 Million/uL 4.18  4.14  4.15   Hemoglobin 11.7 - 15.5 g/dL 16.1  09.6  04.5   HCT 35.0 - 45.0 % 40.3  38.4  39.3   Platelets 140 - 400 Thousand/uL 242  192  182   NEUT# 1,500 - 7,800 cells/uL 2,957  2,420  2,176   Lymph# 850 - 3,900 cells/uL 1,791  1,765  1,479      There is no height or weight on file to calculate BMI.  Orders:  Orders Placed This Encounter  Procedures   DG Foot Complete Left   No orders of the defined types were placed in this encounter.    Procedures: No procedures performed  Clinical Data: No additional findings.  ROS:  All other systems negative, except as noted in the  HPI. Review of Systems  Objective: Vital Signs: There were no vitals taken for this visit.  Specialty Comments:  No specialty comments available.  PMFS History: Patient Active Problem List   Diagnosis Date Noted   Paroxysmal atrial fibrillation (HCC) 04/07/2020   Hypercoagulable state due to paroxysmal atrial fibrillation (HCC) 04/07/2020   Multiple closed fractures of metacarpal bone 07/25/2017   Autoimmune disease (HCC) +ANA +Ro +La +RF oral ulcers nasal ulcers Raynaud photosensitivity SICCA  01/13/2017   High risk medication use 01/13/2017   Photosensitivity 01/13/2017   Sjogren's syndrome with keratoconjunctivitis sicca (HCC) 01/13/2017   Recurrent oral ulcers and nasal ulcers  01/13/2017   Raynaud's disease without gangrene 01/13/2017   Primary osteoarthritis of both hands 01/13/2017   Primary osteoarthritis of both knees 01/13/2017   Primary osteoarthritis of both feet 01/13/2017   Osteopenia of multiple sites 01/13/2017   History of hypertension 01/13/2017   History of hypothyroidism 01/13/2017   History of gastroesophageal reflux (GERD)/ PUD 01/13/2017   History of MRSA infection 01/13/2017   Diarrhea 05/28/2013   Family history of malignant neoplasm of gastrointestinal tract 06/22/2011   ADENOCARCINOMA, BREAST 05/02/2009   CORONARY ARTERY DISEASE 05/02/2009   DYSPHAGIA UNSPECIFIED 05/02/2009   Past Medical History:  Diagnosis Date   ADENOCARCINOMA, BREAST 05/02/2009   Qualifier: Diagnosis of  By: Arlyce Dice MD, Barbette Hair    Anxiety    Arthritis    Autoimmune disorder (HCC)    SJORGREN'S   Breast cancer (HCC)    right breast   Chronic headaches    CORONARY ARTERY DISEASE    cath in 1990 (normal Left main, normal CFX, normal RCA, myocardial bridge in LAD per Dr. York Spaniel note)   Depression    Diarrhea 05/28/2013   DYSPHAGIA UNSPECIFIED 05/02/2009   Qualifier: Diagnosis of  By: Arlyce Dice MD, Barbette Hair    Family history of malignant neoplasm of gastrointestinal tract  06/22/2011   Brother with colon cancer    GERD (gastroesophageal reflux disease)    Hemorrhage of rectum and anus 06/22/2011   History of hypothyroidism 01/13/2017   History of MRSA infection 01/13/2017   Hypertension    Multiple closed fractures of metacarpal bone 07/25/2017   Myocardial bridge    Myocardial infarction [facility])    1990   Neuromuscular disorder (HCC)    LUPUS   Osteopenia of multiple sites 01/13/2017   Osteoporosis    PAC (premature atrial contraction) 04/07/2020   PAF (paroxysmal atrial fibrillation) (HCC)    Photosensitivity 01/13/2017   Primary osteoarthritis of both hands  01/13/2017   Raynaud's disease without gangrene 01/13/2017   Recurrent oral ulcers and nasal ulcers  01/13/2017   Sjogren's syndrome with keratoconjunctivitis sicca (HCC) 01/13/2017   Status post dilation of esophageal narrowing    Thyroid disease    Ulcer     Family History  Problem Relation Age of Onset   Breast cancer Mother    Lung cancer Mother    Cancer Father        larynx   Heart failure Father    Heart disease Brother    Colon cancer Brother    Pseudochol deficiency Other    Breast cancer Sister    Cancer Sister        thyroid    Breast cancer Maternal Grandmother    Autoimmune disease Daughter    Cancer Son        prostate    Diabetes Son    Rheum arthritis Grandchild    Esophageal cancer Neg Hx    Rectal cancer Neg Hx    Stomach cancer Neg Hx     Past Surgical History:  Procedure Laterality Date   APPENDECTOMY     breast reconstuction     x 2   CHOLECYSTECTOMY     COLONOSCOPY     double mastectomy     PARTIAL HYSTERECTOMY  1990   TIBIA FRACTURE SURGERY Right    with subsequent hardware removal   UPPER GASTROINTESTINAL ENDOSCOPY     WRIST FRACTURE SURGERY Left    Social History   Occupational History   Occupation: retired Public house manager  Tobacco Use   Smoking status: Never    Passive exposure: Past   Smokeless tobacco: Never  Vaping Use   Vaping Use: Never used   Substance and Sexual Activity   Alcohol use: Not Currently   Drug use: Never   Sexual activity: Not on file

## 2023-02-01 DIAGNOSIS — Z6822 Body mass index (BMI) 22.0-22.9, adult: Secondary | ICD-10-CM | POA: Diagnosis not present

## 2023-02-01 DIAGNOSIS — E039 Hypothyroidism, unspecified: Secondary | ICD-10-CM | POA: Diagnosis not present

## 2023-02-01 DIAGNOSIS — Z01419 Encounter for gynecological examination (general) (routine) without abnormal findings: Secondary | ICD-10-CM | POA: Diagnosis not present

## 2023-02-01 DIAGNOSIS — M816 Localized osteoporosis [Lequesne]: Secondary | ICD-10-CM | POA: Diagnosis not present

## 2023-02-01 DIAGNOSIS — N958 Other specified menopausal and perimenopausal disorders: Secondary | ICD-10-CM | POA: Diagnosis not present

## 2023-02-01 LAB — HM DEXA SCAN

## 2023-02-04 ENCOUNTER — Inpatient Hospital Stay (HOSPITAL_COMMUNITY): Admission: RE | Admit: 2023-02-04 | Payer: PPO | Source: Ambulatory Visit

## 2023-02-04 ENCOUNTER — Ambulatory Visit (HOSPITAL_COMMUNITY)
Admission: RE | Admit: 2023-02-04 | Discharge: 2023-02-04 | Disposition: A | Discharge status: Home / Self Care | Payer: PPO | Source: Ambulatory Visit | Attending: Physician Assistant | Admitting: Physician Assistant

## 2023-02-04 VITALS — BP 134/76 | HR 55 | Ht 64.5 in | Wt 131.2 lb

## 2023-02-04 DIAGNOSIS — I480 Paroxysmal atrial fibrillation: Secondary | ICD-10-CM | POA: Diagnosis not present

## 2023-02-04 DIAGNOSIS — E039 Hypothyroidism, unspecified: Secondary | ICD-10-CM | POA: Diagnosis not present

## 2023-02-04 DIAGNOSIS — Z5181 Encounter for therapeutic drug level monitoring: Secondary | ICD-10-CM | POA: Diagnosis not present

## 2023-02-04 DIAGNOSIS — Z7901 Long term (current) use of anticoagulants: Secondary | ICD-10-CM | POA: Diagnosis not present

## 2023-02-04 DIAGNOSIS — I2510 Atherosclerotic heart disease of native coronary artery without angina pectoris: Secondary | ICD-10-CM | POA: Insufficient documentation

## 2023-02-04 DIAGNOSIS — Z79899 Other long term (current) drug therapy: Secondary | ICD-10-CM | POA: Diagnosis not present

## 2023-02-04 DIAGNOSIS — D6869 Other thrombophilia: Secondary | ICD-10-CM | POA: Diagnosis not present

## 2023-02-04 DIAGNOSIS — I10 Essential (primary) hypertension: Secondary | ICD-10-CM | POA: Diagnosis not present

## 2023-02-04 NOTE — Progress Notes (Signed)
Primary Care Physician: Creola Corn, MD Primary Cardiologist: Dr Anne Fu Primary Electrophysiologist: none Referring Physician: [facility]/Dr Anne Fu   Alice Medina is a 82 y.o. female with a history of CAD with myocardial bridging, Sjogren's syndrome, HTN, HLD, and atrial fibrillation who presents for follow up in the [facility]. The patient was initially diagnosed with atrial fibrillation remotely and has been maintained on sotalol. Patient is on Eliquis for a CHADS2VASC score of 5.   On follow up today, patient reports that she has noticed lower heart rates recently, lowest 49 bpm. She has felt more fatigued recently. No presyncope or dizziness. No bleeding issues on anticoagulation.   Today, she denies symptoms of palpitations, chest pain, shortness of breath, orthopnea, PND, lower extremity edema, dizziness, presyncope, syncope, snoring, daytime somnolence, bleeding, or neurologic sequela. The patient is tolerating medications without difficulties and is otherwise without complaint today.    Atrial Fibrillation Risk Factors:  she does not have symptoms or diagnosis of sleep apnea. she does not have a history of rheumatic fever.   Atrial Fibrillation Management history:  Previous antiarrhythmic drugs: sotalol Previous cardioversions: none Previous ablations: none Anticoagulation history: Eliquis   Past Medical History:  Diagnosis Date   ADENOCARCINOMA, BREAST 05/02/2009   Qualifier: Diagnosis of  By: Arlyce Dice MD, Barbette Hair    Anxiety    Arthritis    Autoimmune disorder (HCC)    SJORGREN'S   Breast cancer (HCC)    right breast   Chronic headaches    CORONARY ARTERY DISEASE    cath in 1990 (normal Left main, normal CFX, normal RCA, myocardial bridge in LAD per Dr. York Spaniel note)   Depression    Diarrhea 05/28/2013   DYSPHAGIA UNSPECIFIED 05/02/2009   Qualifier: Diagnosis of  By: Arlyce Dice MD, Barbette Hair    Family history of malignant neoplasm  of gastrointestinal tract 06/22/2011   Brother with colon cancer    GERD (gastroesophageal reflux disease)    Hemorrhage of rectum and anus 06/22/2011   History of hypothyroidism 01/13/2017   History of MRSA infection 01/13/2017   Hypertension    Multiple closed fractures of metacarpal bone 07/25/2017   Myocardial bridge    Myocardial infarction [facility])    1990   Neuromuscular disorder (HCC)    LUPUS   Osteopenia of multiple sites 01/13/2017   Osteoporosis    PAC (premature atrial contraction) 04/07/2020   PAF (paroxysmal atrial fibrillation) (HCC)    Photosensitivity 01/13/2017   Primary osteoarthritis of both hands 01/13/2017   Raynaud's disease without gangrene 01/13/2017   Recurrent oral ulcers and nasal ulcers  01/13/2017   Sjogren's syndrome with keratoconjunctivitis sicca (HCC) 01/13/2017   Status post dilation of esophageal narrowing    Thyroid disease    Ulcer     ROS- All systems are reviewed and negative except as per the HPI above.  Physical Exam: Vitals:   02/04/23 1001  BP: 134/76  Pulse: (!) 55  Weight: 59.5 kg  Height: 5' 4.5" (1.638 m)    GEN: Well nourished, well developed in no acute distress NECK: No JVD; No carotid bruits CARDIAC: Regular rate and rhythm, no murmurs, rubs, gallops RESPIRATORY:  Clear to auscultation without rales, wheezing or rhonchi  ABDOMEN: Soft, non-tender, non-distended EXTREMITIES:  No edema; No deformity    Wt Readings from Last 3 Encounters:  02/04/23 59.5 kg  12/22/22 60.4 kg  08/06/22 63.4 kg    EKG today demonstrates  SB, 1st degree AV block Vent. rate 55  BPM PR interval 266 ms QRS duration 88 ms QT/QTcB 432/413 ms  Echo 04/14/20 demonstrated   1. Left ventricular ejection fraction, by estimation, is 65 to 70%. The  left ventricle has normal function. The left ventricle has no regional  wall motion abnormalities. There is moderate concentric left ventricular  hypertrophy. Left ventricular diastolic parameters  are consistent with Grade I diastolic dysfunction (impaired relaxation).   2. Right ventricular systolic function is normal. The right ventricular  size is normal. There is normal pulmonary artery systolic pressure. The  estimated right ventricular systolic pressure is 26.0 mmHg.   3. The mitral valve is normal in structure. No evidence of mitral valve  regurgitation. No evidence of mitral stenosis.   4. The aortic valve is normal in structure. Aortic valve regurgitation is  mild. No aortic stenosis is present.   5. The inferior vena cava is normal in size with greater than 50%  respiratory variability, suggesting right atrial pressure of 3 mmHg.   Epic records are reviewed at length today  CHA2DS2-VASc Score = 5  The patient's score is based upon: CHF History: 0 HTN History: 1 Diabetes History: 0 Stroke History: 0 Vascular Disease History: 1 Age Score: 2 Gender Score: 1       ASSESSMENT AND PLAN: Paroxysmal Atrial Fibrillation (ICD10:  I48.0) The patient's CHA2DS2-VASc score is 5, indicating a 7.2% annual risk of stroke.   Patient appears to be maintaining SR but having slow HR readings. Will have her wear a 2 week Zio monitor to evaluate for significant bradycardia/pauses/blocks. May need to consider decreasing or stopping sotalol and changing to another AAD.  Continue sotalol 80 mg BID Recent cmet/mag reviewed Continue Eliquis 5 mg BID. Her weight is right at 60 kg, if it decreases further will need to decrease dose with age >52 y/o.  Secondary Hypercoagulable State (ICD10:  D68.69) The patient is at significant risk for stroke/thromboembolism based upon her CHA2DS2-VASc Score of 5.  Continue Apixaban (Eliquis).   HTN Stable on current regimen  CAD No anginal symptoms   Follow up in the AF clinic in one month.    Jorja Loa PA-C Afib [facility] [address] [place], [place] [address] [phone]

## 2023-02-09 NOTE — Progress Notes (Signed)
Cardiology Office Note    Date:  02/10/2023  ID:  Adrianna Fredlund, DOB [birth year], MRN [id number] PCP:  Creola Corn, MD  Cardiologist:  Donato Schultz, MD  Electrophysiologist:  None   Chief Complaint: f/u CAD, PAF  History of Present Illness: Marland Kitchen    Madhu Crotteau is a 82 y.o. female with visit-pertinent history of PAF, CAD (myocardial bridging), autoimmune disease followed by rheumatology, HTN, HLD followed by PCP, breast CA, anxiety, dilation of esophageal narrowing, hypercalcemia who presents for routine follow-up. She is a former patient of Dr. York Spaniel then established with Dr. Anne Fu. Prior notes outline history of CAD with myocardial bridging by cath in 1990 (normal Left main, normal CFX, normal RCA, myocardial bridge in LAD per Dr. York Spaniel note). She has been maintained on sotalol for her atrial fibrillation and has followed with afib clinic. 2d echo 03/2020 EF 65-70%, grade 1 DD, normal PA pressure. No clinical hx of CHF. Historically she has declined anticoagulation in favor of aspirin, but was agreeable to starting Eliquis several years back since her sister was on it and has done well. We worked on blood pressure med titration last year. She also has been on spironolactone for control of LE edema.   She is seen back for follow-up doing well overall without angina or dyspnea. Overall her edema is controlled on spironolactone. She had a few days where she did half dose amlodipine due to increased swelling. She notes generalized fatigue for at least the last year. She saw afib clinic 02/04/23 at which time she reported lower HR recently, lowest 49bpm, so monitor was arranged which is in progress. She brings in a log of her BP/HR readings. BPs appear well controlled and HR is typically mid 50s.  Labwork independently reviewed: 11/2022 CBC wnl, K 5.0, Cr 0.91, Na 133, LFTs ok, TSH ok, Mg OK by PCP 05/2022 LDL 79, trig 77 (PCP following)  ROS: Please see the history of present illness. All  other systems are reviewed and otherwise negative.   Studies Reviewed: Marland Kitchen    EKG:  EKG is not ordered today but reviewed from 02/04/23 demonstrating SB 55bpm, first degree AVB, possible prior anteroseptal infarct pattern, nonspecific STTW changes similar to prior.  CV Studies: Cardiac studies reviewed are outlined and summarized above. Otherwise please see EMR for full report.  Physical Exam:    VS:  BP 114/66   Pulse (!) 59   Ht 5' 4.25" (1.632 m)   Wt 131 lb (59.4 kg)   SpO2 97%   BMI 22.31 kg/m    Wt Readings from Last 3 Encounters:  02/10/23 131 lb (59.4 kg)  02/04/23 131 lb 3.2 oz (59.5 kg)  12/22/22 133 lb 3.2 oz (60.4 kg)    GEN: Well nourished, well developed in no acute distress NECK: No JVD; No carotid bruits CARDIAC: RRR, no murmurs, rubs, gallops RESPIRATORY:  Clear to auscultation without rales, wheezing or rhonchi  ABDOMEN: Soft, non-tender, non-distended EXTREMITIES:  No edema; No acute deformity   Asessement and Plan:.    1 CAD by way of intramyocardial bridge - no recent anginal symptoms. Not on ASA with concomitant Eliquis. She is on beta blocker by way of sotalol, managed by Afib clinic.  2. Essential HTN - controlled on present regimen.  3. Paroxysmal atrial fibrillation with bradycardia - remains in NSR by auscultation. Has Zio in progress with close afib follow-up to review ongoing sotalol rx. Recent TSH wnl. Will reduce Eliquis dose to 2.5mg  BID given that the  last 2 visits her weight has been <60kg with clothing and shoes I told her to weigh regularly a home and if she hits 132.2lb or higher to call our office at which time we'll need to consider increasing back to 5mg  BID.  4. Lower extremity edema - last BMET 11/2022 showed increasing hypercalcemia, mild hyponatremia, and K borderline at 5.0. We will recheck today to help ensure OK to continue spironolactone. Recommend continued f/u PCP for hypercalcemia (they were working up PTH per lab note).     Disposition: F/u with gen cards in 1 year (me or Dr. Anne Fu). Continue afib clinic f/u as scheduled. Would recommend they see her q6 months given sotalol, age, Eliquis dosing.  Signed, Laurann Montana, PA-C

## 2023-02-10 ENCOUNTER — Encounter: Payer: Self-pay | Admitting: Physician Assistant

## 2023-02-10 ENCOUNTER — Ambulatory Visit: Payer: PPO | Attending: Physician Assistant | Admitting: Physician Assistant

## 2023-02-10 VITALS — BP 114/66 | HR 59 | Ht 64.25 in | Wt 131.0 lb

## 2023-02-10 DIAGNOSIS — R001 Bradycardia, unspecified: Secondary | ICD-10-CM | POA: Diagnosis not present

## 2023-02-10 DIAGNOSIS — Q245 Malformation of coronary vessels: Secondary | ICD-10-CM | POA: Diagnosis not present

## 2023-02-10 DIAGNOSIS — I10 Essential (primary) hypertension: Secondary | ICD-10-CM

## 2023-02-10 DIAGNOSIS — R600 Localized edema: Secondary | ICD-10-CM

## 2023-02-10 DIAGNOSIS — I480 Paroxysmal atrial fibrillation: Secondary | ICD-10-CM | POA: Diagnosis not present

## 2023-02-10 MED ORDER — AMLODIPINE BESYLATE 10 MG PO TABS: 10.0000 mg | ORAL_TABLET | Freq: Every day | ORAL | 3 refills | Status: DC

## 2023-02-10 MED ORDER — APIXABAN 2.5 MG PO TABS: 2.5000 mg | ORAL_TABLET | Freq: Two times a day (BID) | ORAL | 3 refills | Status: DC

## 2023-02-10 NOTE — Patient Instructions (Signed)
Medication Instructions:  Your physician has recommended you make the following change in your medication:   REDUCE the Eliquis to 2.5 mg taking 1 twice a day.  DO NOT CUT THE 5 MG TABLETS IN 1/2  *If you need a refill on your cardiac medications before your next appointment, please call your pharmacy*   Lab Work: TODAY:  BMET  If you have labs (blood work) drawn today and your tests are completely normal, you will receive your results only by: MyChart Message (if you have MyChart) OR A paper copy in the mail If you have any lab test that is abnormal or we need to change your treatment, we will call you to review the results.   Testing/Procedures: None ordered   Follow-Up: At [facility], you and your health needs are our priority.  As part of our continuing mission to provide you with exceptional heart care, we have created designated Provider Care Teams.  These Care Teams include your primary Cardiologist (physician) and Advanced Practice Providers (APPs -  Physician Assistants and Nurse Practitioners) who all work together to provide you with the care you need, when you need it.  We recommend signing up for the patient portal called "MyChart".  Sign up information is provided on this After Visit Summary.  MyChart is used to connect with patients for Virtual Visits (Telemedicine).  Patients are able to view lab/test results, encounter notes, upcoming appointments, etc.  Non-urgent messages can be sent to your provider as well.   To learn more about what you can do with MyChart, go to [email or web address].    Your next appointment:   1 year(s)  Provider:   Donato Schultz, MD  or Ronie Spies, PA-C         Other Instructions

## 2023-02-11 LAB — BASIC METABOLIC PANEL
BUN/Creatinine Ratio: 27 (ref 12–28)
BUN: 29 mg/dL — ABNORMAL HIGH (ref 8–27)
CO2: 22 mmol/L (ref 20–29)
Calcium: 11 mg/dL — ABNORMAL HIGH (ref 8.7–10.3)
Chloride: 99 mmol/L (ref 96–106)
Creatinine, Ser: 1.08 mg/dL — ABNORMAL HIGH (ref 0.57–1.00)
Glucose: 85 mg/dL (ref 70–99)
Potassium: 4.6 mmol/L (ref 3.5–5.2)
Sodium: 133 mmol/L — ABNORMAL LOW (ref 134–144)
eGFR: 51 mL/min/{1.73_m2} — ABNORMAL LOW (ref >59)

## 2023-02-20 ENCOUNTER — Other Ambulatory Visit: Payer: Self-pay | Admitting: Cardiology

## 2023-02-20 ENCOUNTER — Other Ambulatory Visit: Payer: Self-pay | Admitting: Physician Assistant

## 2023-02-20 DIAGNOSIS — Z79899 Other long term (current) drug therapy: Secondary | ICD-10-CM

## 2023-02-20 DIAGNOSIS — M359 Systemic involvement of connective tissue, unspecified: Secondary | ICD-10-CM

## 2023-02-21 ENCOUNTER — Telehealth: Payer: Self-pay | Admitting: *Deleted

## 2023-02-21 NOTE — Telephone Encounter (Signed)
Received DEXA results from [facility].  Date of Scan: 02/01/2023  Lowest T-score:-4.3  BMD:0.349  Lowest site measured:Right Total Femur   DX: Osteoporosis  Significant changes in BMD and site measured (5% and above):n/a  Current Regimen:Vitamin D, Evista 3 times per week  Recommendation:Patient should see Endocrinologist as recommended by GYN. Patient should discuss Forteo or Tymlos.   Reviewed by:Dr. Pollyann Savoy   Next Appointment:  04/26/2023  Patient advised of recommendations by Dr. Corliss Skains.

## 2023-02-21 NOTE — Telephone Encounter (Signed)
Last Fill: 11/05/2022  Eye exam: 04/23/2022 WNL   Labs: 12/22/2022 Sed rate states elevated and stable.  SPEP is nonspecific and consistent with autoimmune disease.  CBC is normal.  IFE normal.  UA negative.  Complements normal and double-stranded DNA negative.  Labs do not indicate an autoimmune disease flare.  No change in treatment advised.   Next Visit: 04/26/2023  Last Visit: 12/22/2022  DX:Autoimmune disease   Current Dose per office note 12/22/2022: Plaquenil 200 mg 1 tablet by mouth BID Monday through Friday   Okay to refill Plaquenil?

## 2023-02-23 DIAGNOSIS — I480 Paroxysmal atrial fibrillation: Secondary | ICD-10-CM | POA: Diagnosis not present

## 2023-02-28 NOTE — Addendum Note (Signed)
Encounter addended by: Shona Simpson, RN on: 02/28/2023 2:55 PM  Actions taken: Imaging Exam ended

## 2023-03-08 ENCOUNTER — Encounter (HOSPITAL_COMMUNITY): Payer: Self-pay | Admitting: Physician Assistant

## 2023-03-08 ENCOUNTER — Ambulatory Visit (HOSPITAL_COMMUNITY)
Admission: RE | Admit: 2023-03-08 | Discharge: 2023-03-08 | Disposition: A | Discharge status: Home / Self Care | Payer: PPO | Source: Ambulatory Visit | Attending: Physician Assistant | Admitting: Physician Assistant

## 2023-03-08 VITALS — BP 114/76 | HR 61 | Ht 64.25 in | Wt 130.4 lb

## 2023-03-08 DIAGNOSIS — I480 Paroxysmal atrial fibrillation: Secondary | ICD-10-CM | POA: Diagnosis not present

## 2023-03-08 DIAGNOSIS — Z5181 Encounter for therapeutic drug level monitoring: Secondary | ICD-10-CM | POA: Diagnosis not present

## 2023-03-08 DIAGNOSIS — D6869 Other thrombophilia: Secondary | ICD-10-CM | POA: Diagnosis not present

## 2023-03-08 DIAGNOSIS — I4891 Unspecified atrial fibrillation: Secondary | ICD-10-CM | POA: Diagnosis present

## 2023-03-08 DIAGNOSIS — I10 Essential (primary) hypertension: Secondary | ICD-10-CM | POA: Insufficient documentation

## 2023-03-08 DIAGNOSIS — Z79899 Other long term (current) drug therapy: Secondary | ICD-10-CM

## 2023-03-08 DIAGNOSIS — R9431 Abnormal electrocardiogram [ECG] [EKG]: Secondary | ICD-10-CM | POA: Diagnosis not present

## 2023-03-08 DIAGNOSIS — I2510 Atherosclerotic heart disease of native coronary artery without angina pectoris: Secondary | ICD-10-CM | POA: Diagnosis not present

## 2023-03-08 MED ORDER — SOTALOL HCL 80 MG PO TABS: ORAL_TABLET | ORAL | Status: DC

## 2023-03-08 NOTE — Progress Notes (Signed)
Primary Care Physician: Creola Corn, MD Primary Cardiologist: Dr Anne Fu Primary Electrophysiologist: none Referring Physician: [facility]/Dr Anne Fu   Raianna Novotney is a 82 y.o. female with a history of CAD with myocardial bridging, Sjogren's syndrome, HTN, HLD, and atrial fibrillation who presents for follow up in the [facility]. The patient was initially diagnosed with atrial fibrillation remotely and has been maintained on sotalol. Patient is on Eliquis for a CHADS2VASC score of 5.   On follow up today, patient wore a Zio monitor which showed 0% afib burden, no high degree blocks or significant bradycardia, patient triggered episodes associated with SR. She does report having an afib episode for 1-2 hours after taking off the monitor. She is constantly fatigued.   Today, she denies symptoms of chest pain, shortness of breath, orthopnea, PND, lower extremity edema, dizziness, presyncope, syncope, snoring, daytime somnolence, bleeding, or neurologic sequela. The patient is tolerating medications without difficulties and is otherwise without complaint today.    Atrial Fibrillation Risk Factors:  she does not have symptoms or diagnosis of sleep apnea. she does not have a history of rheumatic fever.   Atrial Fibrillation Management history:  Previous antiarrhythmic drugs: sotalol Previous cardioversions: none Previous ablations: none Anticoagulation history: Eliquis   Past Medical History:  Diagnosis Date   ADENOCARCINOMA, BREAST 05/02/2009   Qualifier: Diagnosis of  By: Arlyce Dice MD, Barbette Hair    Anxiety    Arthritis    Autoimmune disorder (HCC)    SJORGREN'S   Breast cancer (HCC)    right breast   Chronic headaches    CORONARY ARTERY DISEASE    cath in 1990 (normal Left main, normal CFX, normal RCA, myocardial bridge in LAD per Dr. York Spaniel note)   Depression    Diarrhea 05/28/2013   DYSPHAGIA UNSPECIFIED 05/02/2009   Qualifier: Diagnosis of   By: Arlyce Dice MD, Barbette Hair    Family history of malignant neoplasm of gastrointestinal tract 06/22/2011   Brother with colon cancer    GERD (gastroesophageal reflux disease)    Hemorrhage of rectum and anus 06/22/2011   History of hypothyroidism 01/13/2017   History of MRSA infection 01/13/2017   Hypertension    Multiple closed fractures of metacarpal bone 07/25/2017   Myocardial bridge    Myocardial infarction [facility])    1990   Neuromuscular disorder (HCC)    LUPUS   Osteopenia of multiple sites 01/13/2017   Osteoporosis    PAC (premature atrial contraction) 04/07/2020   PAF (paroxysmal atrial fibrillation) (HCC)    Photosensitivity 01/13/2017   Primary osteoarthritis of both hands 01/13/2017   Raynaud's disease without gangrene 01/13/2017   Recurrent oral ulcers and nasal ulcers  01/13/2017   Sjogren's syndrome with keratoconjunctivitis sicca (HCC) 01/13/2017   Status post dilation of esophageal narrowing    Thyroid disease    Ulcer     ROS- All systems are reviewed and negative except as per the HPI above.  Physical Exam: Vitals:   03/08/23 0949  BP: 114/76  Pulse: 61  Weight: 59.1 kg  Height: 5' 4.25" (1.632 m)    GEN: Well nourished, well developed in no acute distress NECK: No JVD; No carotid bruits CARDIAC: Regular rate and rhythm, no murmurs, rubs, gallops RESPIRATORY:  Clear to auscultation without rales, wheezing or rhonchi  ABDOMEN: Soft, non-tender, non-distended EXTREMITIES:  No edema; No deformity    Wt Readings from Last 3 Encounters:  03/08/23 59.1 kg  02/10/23 59.4 kg  02/04/23 59.5 kg  EKG today demonstrates  SR, 1st degree AV block Vent. rate 61 BPM PR interval 268 ms QRS duration 84 ms QT/QTcB 412/414 ms  Echo 04/14/20 demonstrated   1. Left ventricular ejection fraction, by estimation, is 65 to 70%. The  left ventricle has normal function. The left ventricle has no regional  wall motion abnormalities. There is moderate concentric left  ventricular  hypertrophy. Left ventricular diastolic parameters are consistent with Grade I diastolic dysfunction (impaired relaxation).   2. Right ventricular systolic function is normal. The right ventricular  size is normal. There is normal pulmonary artery systolic pressure. The  estimated right ventricular systolic pressure is 26.0 mmHg.   3. The mitral valve is normal in structure. No evidence of mitral valve  regurgitation. No evidence of mitral stenosis.   4. The aortic valve is normal in structure. Aortic valve regurgitation is  mild. No aortic stenosis is present.   5. The inferior vena cava is normal in size with greater than 50%  respiratory variability, suggesting right atrial pressure of 3 mmHg.   Epic records are reviewed at length today  CHA2DS2-VASc Score = 5  The patient's score is based upon: CHF History: 0 HTN History: 1 Diabetes History: 0 Stroke History: 0 Vascular Disease History: 1 Age Score: 2 Gender Score: 1      ASSESSMENT AND PLAN: Paroxysmal Atrial Fibrillation (ICD10:  I48.0) The patient's CHA2DS2-VASc score is 5, indicating a 7.2% annual risk of stroke.   Zio monitor showed 0% afib burden, no blocks or pauses.  Given her reduced CrCl, will decrease her sotalol to 80 mg daily. If she has more frequent afib, may need to consider alternate AAD. Not likely a good candidate for dofetilide as she is on hydroxychloroquine. Could consider amiodarone or ablation.  Continue Eliquis 2.5 mg BID (weight < 60 kg, age > 64)  Secondary Hypercoagulable State (ICD10:  D68.69) The patient is at significant risk for stroke/thromboembolism based upon her CHA2DS2-VASc Score of 5.  Continue Apixaban (Eliquis).   HTN Stable on current regimen  CAD No anginal symptoms   Follow up with EP to establish care in 3 months.    Jorja Loa PA-C [facility] [address] [place], [place] [address] [phone]

## 2023-03-08 NOTE — Patient Instructions (Signed)
Decrease Sotalol to 80 mg once daily

## 2023-04-10 ENCOUNTER — Other Ambulatory Visit: Payer: Self-pay | Admitting: Cardiology

## 2023-04-13 NOTE — Progress Notes (Unsigned)
Office Visit Note  Patient: Alice Medina             Date of Birth: [birth year]           MRN: [id number]             PCP: Creola Corn, MD Referring: Creola Corn, MD Visit Date: 04/26/2023 Occupation: @GUAROCC @  Subjective:  Fatigue   History of Present Illness: Alice Medina is a 82 y.o. female with history of autoimmune disease and sjogren's syndrome.  Patient is taking plaquenil 200 mg 1 tablet by mouth twice daily Monday through Friday.  She is tolerating Plaquenil without any side effects and has not missed any doses recently.  Patient continues to have chronic sicca symptoms and has been using lubricating eyedrops for symptoms of dry eyes. She has intermittent nasal ulcers. She continues to have arthralgias but denies any joint swelling.  She has intermittent symptoms of Raynaud's especially in her toes.  She has been sleeping with socks at night which has been helpful.  Her most consistent concern has been her level of fatigue. Patient is scheduled for an upcoming appointment with endocrinology discussed treatment options for osteoporosis.  She continues to have frequent falls.  She has not been using a cane or walker to assist with ambulation recently.  Her last fall was 2 to 3 weeks ago at home at which time she landed on both knees.     Activities of Daily Living:  Patient reports morning stiffness for 30 minutes.   Patient Reports nocturnal pain.  Difficulty dressing/grooming: Denies Difficulty climbing stairs: Reports Difficulty getting out of chair: Reports Difficulty using hands for taps, buttons, cutlery, and/or writing: Reports  Review of Systems  Constitutional:  Positive for fatigue.  HENT:  Positive for mouth dryness. Negative for mouth sores.        Nose sores  Eyes:  Positive for dryness.  Respiratory:  Positive for shortness of breath.   Cardiovascular:  Negative for chest pain and palpitations.  Gastrointestinal:  Negative for blood in stool,  constipation and diarrhea.  Endocrine: Negative for increased urination.  Genitourinary:  Positive for nocturia. Negative for involuntary urination.  Musculoskeletal:  Positive for joint pain, gait problem, joint pain, joint swelling, myalgias, morning stiffness, muscle tenderness and myalgias. Negative for muscle weakness.  Skin:  Positive for color change, rash, hair loss and sensitivity to sunlight.  Neurological:  Positive for headaches. Negative for dizziness.  Hematological:  Negative for swollen glands.  Psychiatric/Behavioral:  Negative for depressed mood and sleep disturbance. The patient is nervous/anxious.     PMFS History:  Patient Active Problem List   Diagnosis Date Noted   Paroxysmal atrial fibrillation (HCC) 04/07/2020   Hypercoagulable state due to paroxysmal atrial fibrillation (HCC) 04/07/2020   Multiple closed fractures of metacarpal bone 07/25/2017   Autoimmune disease (HCC) +ANA +Ro +La +RF oral ulcers nasal ulcers Raynaud photosensitivity SICCA  01/13/2017   High risk medication use 01/13/2017   Photosensitivity 01/13/2017   Sjogren's syndrome with keratoconjunctivitis sicca (HCC) 01/13/2017   Recurrent oral ulcers and nasal ulcers  01/13/2017   Raynaud's disease without gangrene 01/13/2017   Primary osteoarthritis of both hands 01/13/2017   Primary osteoarthritis of both knees 01/13/2017   Primary osteoarthritis of both feet 01/13/2017   Osteopenia of multiple sites 01/13/2017   History of hypertension 01/13/2017   History of hypothyroidism 01/13/2017   History of gastroesophageal reflux (GERD)/ PUD 01/13/2017   History of MRSA infection 01/13/2017   Diarrhea 05/28/2013  Family history of malignant neoplasm of gastrointestinal tract 06/22/2011   Malignant neoplasm of female breast (HCC) 05/02/2009   Coronary atherosclerosis 05/02/2009   Dysphagia 05/02/2009    Past Medical History:  Diagnosis Date   ADENOCARCINOMA, BREAST 05/02/2009   Qualifier:  Diagnosis of  By: Arlyce Dice MD, Barbette Hair    Anxiety    Arthritis    Autoimmune disorder (HCC)    SJORGREN'S   Breast cancer (HCC)    right breast   Chronic headaches    CORONARY ARTERY DISEASE    cath in 1990 (normal Left main, normal CFX, normal RCA, myocardial bridge in LAD per Dr. York Spaniel note)   Depression    Diarrhea 05/28/2013   DYSPHAGIA UNSPECIFIED 05/02/2009   Qualifier: Diagnosis of  By: Arlyce Dice MD, Barbette Hair    Family history of malignant neoplasm of gastrointestinal tract 06/22/2011   Brother with colon cancer    GERD (gastroesophageal reflux disease)    Hemorrhage of rectum and anus 06/22/2011   History of hypothyroidism 01/13/2017   History of MRSA infection 01/13/2017   Hypertension    Multiple closed fractures of metacarpal bone 07/25/2017   Myocardial bridge    Myocardial infarction (HCC)    1990   Neuromuscular disorder (HCC)    LUPUS   Osteopenia of multiple sites 01/13/2017   Osteoporosis    PAC (premature atrial contraction) 04/07/2020   PAF (paroxysmal atrial fibrillation) (HCC)    Photosensitivity 01/13/2017   Primary osteoarthritis of both hands 01/13/2017   Raynaud's disease without gangrene 01/13/2017   Recurrent oral ulcers and nasal ulcers  01/13/2017   Sjogren's syndrome with keratoconjunctivitis sicca (HCC) 01/13/2017   Status post dilation of esophageal narrowing    Thyroid disease    Ulcer     Family History  Problem Relation Age of Onset   Breast cancer Mother    Lung cancer Mother    Cancer Father        larynx   Heart failure Father    Breast cancer Sister    Cancer Sister        thyroid    Heart disease Brother    Colon cancer Brother    Breast cancer Maternal Grandmother    Autoimmune disease Daughter    Cancer Son        prostate    Diabetes Son    Rheum arthritis Grandchild    Alopecia Grandchild    Pseudochol deficiency Other    Esophageal cancer Neg Hx    Rectal cancer Neg Hx    Stomach cancer Neg Hx    Past Surgical  History:  Procedure Laterality Date   APPENDECTOMY     breast reconstuction     x 2   CHOLECYSTECTOMY     COLONOSCOPY     double mastectomy     PARTIAL HYSTERECTOMY  1990   TIBIA FRACTURE SURGERY Right    with subsequent hardware removal   UPPER GASTROINTESTINAL ENDOSCOPY     WRIST FRACTURE SURGERY Left    Social History   Social History Narrative   1 cup of coffee daily   Immunization History  Administered Date(s) Administered   Influenza Split 05/07/2010, 05/14/2011, 05/12/2012, 04/19/2013, 04/15/2014   Influenza, Quadrivalent, Recombinant, Inj, Pf 04/22/2018, 04/07/2019, 05/10/2020   Influenza,inj,Quad PF,6+ Mos 04/15/2014, 05/19/2015   Influenza-Unspecified 04/24/2016, 04/23/2017   PFIZER(Purple Top)SARS-COV-2 Vaccination 09/03/2019, 09/28/2019, 06/27/2020   Pneumococcal Conjugate-13 11/20/2013, 12/24/2014   Pneumococcal Polysaccharide-23 09/08/2007, 01/06/2018   Td,absorbed, Preservative Free, Adult Use, Lf Unspecified  04/13/2011   Tdap 10/29/2009   Tetanus 04/03/2022   Unspecified SARS-COV-2 Vaccination 09/03/2019, 09/28/2019   Zoster Recombinant(Shingrix) 01/10/2018, 05/11/2018, 09/19/2018   Zoster, Live 11/20/2013, 01/13/2018, 05/11/2018     Objective: Vital Signs: BP 114/73 (BP Location: Left Arm, Patient Position: Sitting, Cuff Size: Normal)   Pulse 66   Resp 14   Ht 5' 4.5" (1.638 m)   Wt 130 lb 6.4 oz (59.1 kg)   BMI 22.04 kg/m    Physical Exam Vitals and nursing note reviewed.  Constitutional:      Appearance: She is well-developed.  HENT:     Head: Normocephalic and atraumatic.  Eyes:     Conjunctiva/sclera: Conjunctivae normal.  Cardiovascular:     Rate and Rhythm: Normal rate and regular rhythm.     Heart sounds: Normal heart sounds.  Pulmonary:     Effort: Pulmonary effort is normal.     Breath sounds: Normal breath sounds.  Abdominal:     General: Bowel sounds are normal.     Palpations: Abdomen is soft.  Musculoskeletal:     Cervical  back: Normal range of motion.  Lymphadenopathy:     Cervical: No cervical adenopathy.  Skin:    General: Skin is warm and dry.     Capillary Refill: Capillary refill takes less than 2 seconds.  Neurological:     Mental Status: She is alert and oriented to person, place, and time.  Psychiatric:        Behavior: Behavior normal.      Musculoskeletal Exam: C-spine has limited range of motion.  Thoracic kyphosis noted.  Shoulder joints and elbow joints have good ROM.  Limited range of motion of the left wrist.  PIP and DIP thickening consistent with osteoarthritis of both hands.  Hip joints have good range of motion with no groin pain.  Knee joints have good range of motion with no warmth or effusion.  Tenderness over the left ankle.   CDAI Exam: CDAI Score: -- Patient Global: --; Provider Global: -- Swollen: --; Tender: -- Joint Exam 04/26/2023   No joint exam has been documented for this visit   There is currently no information documented on the homunculus. Go to the Rheumatology activity and complete the homunculus joint exam.  Investigation: No additional findings.  Imaging: No results found.  Recent Labs: Lab Results  Component Value Date   WBC 5.3 12/22/2022   HGB 13.2 12/22/2022   PLT 242 12/22/2022   NA 133 (L) 02/10/2023   K 4.6 02/10/2023   CL 99 02/10/2023   CO2 22 02/10/2023   GLUCOSE 85 02/10/2023   BUN 29 (H) 02/10/2023   CREATININE 1.08 (H) 02/10/2023   BILITOT 0.4 07/08/2022   ALKPHOS 79 12/13/2017   AST 32 07/08/2022   ALT 19 07/08/2022   PROT 7.4 12/22/2022   ALBUMIN 3.6 12/13/2017   CALCIUM 11.0 (H) 02/10/2023   GFRAA 81 10/16/2020    Speciality Comments: PLQ Eye Exam: 04/15/2023 WNL ( Per [facility] Ophthalmology difficult to assess due to ARMD. Patient to follow up with Dr. Allyne Gee in 6 months.) (Per Ladona Ridgel, obtain records from Dr. Allyne Gee once completed.)  Procedures:  No procedures performed Allergies: Sulfa antibiotics, Codeine, Robinul  [glycopyrrolate], and Sulfamethoxazole-trimethoprim    Assessment / Plan:     Visit Diagnoses: Autoimmune disease (HCC) - +ANA +Ro +La +RF oral ulcers nasal ulcers Raynaud photosensitivity, sicca: She has not had any signs or symptoms of a flare.  She has clinically been doing well taking  Plaquenil 200 mg 1 tablet by mouth twice daily Monday through Friday.  She is tolerating Plaquenil without any side effects and has not missed any doses recently.  She continues to have occasional oral and nasal ulcerations as well as ongoing sicca symptoms.  She has intermittent symptoms of Raynaud's phenomenon.  No synovitis noted on examination today.   Lab work from 12/22/22 was reviewed today in the office: IFE no monoclonal protein bands, ESR 39-stable, complements WNL, and dsDNA negative.  The following lab work will be updated today for further evaluation.  She remain on Plaquenil as prescribed.  She was advised to notify us if she develops signs or symptoms of a flare.  She will follow-up in the office in 4 months or sooner if needed. - Plan: hydroxychloroquine (PLAQUENIL) 200 MG tablet, CBC with Differential/Platelet, COMPLETE METABOLIC PANEL WITH GFR, Anti-DNA antibody, double-stranded, C3 and C4, Sedimentation rate, Protein / creatinine ratio, urine, ANA  Sjogren's syndrome with keratoconjunctivitis sicca (HCC) - sicca symptoms, positive Ro, positive loss antibody.  Rheumatoid factor has been negative.  She continues to have chronic sicca symptoms.  She uses restasis twice daily and lubricating eye drops for symptomatic relief. She is taking plaquenil 200 mg 1 tablet by mouth twice daily Monday through Friday.  She is tolerating Plaquenil without any side effects. No synovitis noted on examination today.  Discussed the increased risk for developing lymphoma in patients with sjogren's syndrome-IFE normal on 12/22/22.  The following lab work will be updated today.  She will remain on Plaquenil as prescribed.  A  refill of Plaquenil was sent to the pharmacy today.    - Plan: hydroxychloroquine (PLAQUENIL) 200 MG tablet, CBC with Differential/Platelet, COMPLETE METABOLIC PANEL WITH GFR, Anti-DNA antibody, double-stranded, C3 and C4, Sedimentation rate, Protein / creatinine ratio, urine, ANA, Serum protein electrophoresis with reflex  High risk medication use - Plaquenil 200 mg 1 tablet by mouth BID Monday through Friday.  PLQ Eye Exam: 04/15/2023 WNL ( Per [facility] Ophthalmology difficult to assess due to ARMD. Patient to follow up with Dr. Allyne Gee in 6 months.) CBC and CMP updated today. - Plan: hydroxychloroquine (PLAQUENIL) 200 MG tablet, CBC with Differential/Platelet, COMPLETE METABOLIC PANEL WITH GFR  Raynaud's disease without gangrene: She has intermittent symptoms of Raynaud's phenomena especially in her toes.  Chronic right shoulder pain: Good range of motion of the right shoulder noted.  Primary osteoarthritis of both hands: She has PIP and DIP thickening consistent with osteoarthritis of both hands.  No synovitis noted on examination today.  Trochanteric bursitis, left hip: She has tenderness over the left trochanteric bursa.  Offered a referral to physical therapy but she would like to hold off at this time.  Patient was encouraged to perform stretching exercises daily.  Primary osteoarthritis of both knees: Good ROM of both knees-no warmth or effusion noted.   Primary osteoarthritis of both feet: She experiences intermittent soreness in both ankles especially the left ankle.  History of foot fracture - Closed fracture of left calcaneus-11/16/21.  Age-related osteoporosis without current pathological fracture - Previous DEXA: 12/14/18 BMD 0.523 with T-score -2.9 1/3 right forearm.   DEXA on 12/15/2020-results unclear on scanned copy in epic. D[facility] 02/01[facility]2023: Right total femur T-score -4.3 BMD 0.349.   Frequent falls and previous fractures.   Patient has been taking calcium, vitamin D, and advised to 3 days/week.  She has an upcoming appointment scheduled with endocrinology to discuss treatment options.  She may  benefit from the use of Tymlos or Forteo.  Discussed possible side effects of Tymlos today in detail.  Patient was given a handout of information about Tymlos to review.  She plans on further discussing with endocrinology.  Vitamin D deficiency: She is taking a calcium and vitamin D supplement.  Other medical conditions are listed as follows:  History of gastroesophageal reflux (GERD)/ PUD  History of hypertension: Blood pressure was 114/73 today in the office.  History of atrial fibrillation  History of MRSA infection  History of hypothyroidism    Orders: Orders Placed This Encounter  Procedures   CBC with Differential/Platelet   COMPLETE METABOLIC PANEL WITH GFR   Anti-DNA antibody, double-stranded   C3 and C4   Sedimentation rate   Protein / creatinine ratio, urine   ANA   Serum protein electrophoresis with reflex   Meds ordered this encounter  Medications   hydroxychloroquine (PLAQUENIL) 200 MG tablet    Sig: TAKE 1 TABLET BY MOUTH TWICE DAILY( EVERY 12 HOURS) ON MONDAY THROUGH FRIDAY ONLY.    Dispense:  120 tablet    Refill:  0     Follow-Up Instructions: Return in about 4 months (around 08/27/2023) for Autoimmune Disease, Sjogren's syndrome.   Gearldine Bienenstock, PA-C  Note - This record has been created using Dragon software.  Chart creation errors have been sought, but may not always  have been located. Such creation errors do not reflect on  the standard of medical care.

## 2023-04-15 DIAGNOSIS — H35373 Puckering of macula, bilateral: Secondary | ICD-10-CM | POA: Diagnosis not present

## 2023-04-15 DIAGNOSIS — H04123 Dry eye syndrome of bilateral lacrimal glands: Secondary | ICD-10-CM | POA: Diagnosis not present

## 2023-04-15 DIAGNOSIS — H524 Presbyopia: Secondary | ICD-10-CM | POA: Diagnosis not present

## 2023-04-15 DIAGNOSIS — H5213 Myopia, bilateral: Secondary | ICD-10-CM | POA: Diagnosis not present

## 2023-04-15 DIAGNOSIS — H2512 Age-related nuclear cataract, left eye: Secondary | ICD-10-CM | POA: Diagnosis not present

## 2023-04-15 DIAGNOSIS — Z79899 Other long term (current) drug therapy: Secondary | ICD-10-CM | POA: Diagnosis not present

## 2023-04-15 DIAGNOSIS — H353132 Nonexudative age-related macular degeneration, bilateral, intermediate dry stage: Secondary | ICD-10-CM | POA: Diagnosis not present

## 2023-04-26 ENCOUNTER — Encounter: Payer: Self-pay | Admitting: Physician Assistant

## 2023-04-26 ENCOUNTER — Ambulatory Visit: Payer: PPO | Attending: Physician Assistant | Admitting: Physician Assistant

## 2023-04-26 VITALS — BP 114/73 | HR 66 | Resp 14 | Ht 64.5 in | Wt 130.4 lb

## 2023-04-26 DIAGNOSIS — M7062 Trochanteric bursitis, left hip: Secondary | ICD-10-CM | POA: Diagnosis not present

## 2023-04-26 DIAGNOSIS — M359 Systemic involvement of connective tissue, unspecified: Secondary | ICD-10-CM

## 2023-04-26 DIAGNOSIS — M19071 Primary osteoarthritis, right ankle and foot: Secondary | ICD-10-CM

## 2023-04-26 DIAGNOSIS — M170 Bilateral primary osteoarthritis of knee: Secondary | ICD-10-CM

## 2023-04-26 DIAGNOSIS — M19072 Primary osteoarthritis, left ankle and foot: Secondary | ICD-10-CM

## 2023-04-26 DIAGNOSIS — E559 Vitamin D deficiency, unspecified: Secondary | ICD-10-CM

## 2023-04-26 DIAGNOSIS — M810 Age-related osteoporosis without current pathological fracture: Secondary | ICD-10-CM

## 2023-04-26 DIAGNOSIS — Z8614 Personal history of Methicillin resistant Staphylococcus aureus infection: Secondary | ICD-10-CM

## 2023-04-26 DIAGNOSIS — I7300 Raynaud's syndrome without gangrene: Secondary | ICD-10-CM

## 2023-04-26 DIAGNOSIS — Z8719 Personal history of other diseases of the digestive system: Secondary | ICD-10-CM

## 2023-04-26 DIAGNOSIS — G8929 Other chronic pain: Secondary | ICD-10-CM

## 2023-04-26 DIAGNOSIS — Z79899 Other long term (current) drug therapy: Secondary | ICD-10-CM

## 2023-04-26 DIAGNOSIS — M25511 Pain in right shoulder: Secondary | ICD-10-CM | POA: Diagnosis not present

## 2023-04-26 DIAGNOSIS — M3501 Sicca syndrome with keratoconjunctivitis: Secondary | ICD-10-CM

## 2023-04-26 DIAGNOSIS — M19041 Primary osteoarthritis, right hand: Secondary | ICD-10-CM

## 2023-04-26 DIAGNOSIS — Z8639 Personal history of other endocrine, nutritional and metabolic disease: Secondary | ICD-10-CM

## 2023-04-26 DIAGNOSIS — Z8781 Personal history of (healed) traumatic fracture: Secondary | ICD-10-CM | POA: Diagnosis not present

## 2023-04-26 DIAGNOSIS — M19042 Primary osteoarthritis, left hand: Secondary | ICD-10-CM

## 2023-04-26 DIAGNOSIS — Z8679 Personal history of other diseases of the circulatory system: Secondary | ICD-10-CM

## 2023-04-26 MED ORDER — HYDROXYCHLOROQUINE SULFATE 200 MG PO TABS
ORAL_TABLET | ORAL | 0 refills | Status: DC
Start: 2023-04-26 — End: 2023-08-08

## 2023-04-26 NOTE — Patient Instructions (Addendum)
Abaloparatide Injection What is this medication? ABALOPARATIDE (a ball oh PAR a tide) treats osteoporosis. It works by Interior and spatial designer stronger and less likely to break (fracture). This medicine may be used for other purposes; ask your health care provider or pharmacist if you have questions. COMMON BRAND NAME(S): TYMLOS What should I tell my care team before I take this medication? They need to know if you have any of these conditions: Bone disease other than osteoporosis High levels of an enzyme called alkaline phosphatase in the blood High levels of calcium in the blood History of cancer in the bone Kidney stone Paget's disease Parathyroid disease Receiving radiation therapy An unusual or allergic reaction to abaloparatide, other medications, foods, dyes, or preservatives Pregnant or trying to get pregnant Breast-feeding How should I use this medication? This medication is injected under the skin. You will be taught how to prepare and give it. Take it as directed on the prescription label at the same time every day. Keep taking it unless your care team tells you to stop. It is important that you put your used needles and pens in a special sharps container. Do not put them in a trash can. If you do not have a sharps container, call your pharmacist or care team to get one. A special MedGuide will be given to you by the pharmacist with each prescription and refill. Be sure to read this information carefully each time. Talk to your care team about the use of this medication in children. Special care may be needed. Overdosage: If you think you have taken too much of this medicine contact a poison control center or emergency room at once. NOTE: This medicine is only for you. Do not share this medicine with others. What if I miss a dose? If you miss a dose, take it as soon as you can. If it is almost time for your next dose, take only that dose. Do not take double or extra doses. What may  interact with this medication? Interactions have not been studied. This list may not describe all possible interactions. Give your health care provider a list of all the medicines, herbs, non-prescription drugs, or dietary supplements you use. Also tell them if you smoke, drink alcohol, or use illegal drugs. Some items may interact with your medicine. What should I watch for while using this medication? Visit your care team for regular checks on your progress. You may need blood work done while you are taking this medication. This medication may affect your coordination, reaction time, or judgment. Do not drive or operate machinery until you know how this medication affects you. Sit up or stand slowly to reduce the risk of dizzy or fainting spells. Drinking alcohol with this medication can increase the risk of these side effects. You should make sure that you get enough calcium and vitamin D while you are taking this medication. Discuss the foods you eat and the vitamins you take with your care team. Talk to your care team about your risk of cancer. You may be more at risk for certain types of cancers if you take this medication. Do not share pens with anyone, even if the needle is changed. Each pen should only be used by one person. Sharing could cause an infection. What side effects may I notice from receiving this medication? Side effects that you should report to your care team as soon as possible: Allergic reactions--skin rash, itching, hives, swelling of the face, lips, tongue, or throat High  calcium level--increased thirst or amount of urine, nausea, vomiting, confusion, unusual weakness or fatigue, bone pain Kidney stones--blood in the urine, pain or trouble passing urine, pain in the lower back or sides Low blood pressure--dizziness, feeling faint or lightheaded, blurry vision Side effects that usually do not require medical attention (report these to your care team if they continue or are  bothersome): Dizziness Fatigue Headache Nausea Pain, redness, or irritation at injection site This list may not describe all possible side effects. Call your doctor for medical advice about side effects. You may report side effects to FDA at 1-800-FDA-1088. Where should I keep my medication? Keep out of the reach of children and pets. Store unopened pens in the refrigerator between 2 and 8 degrees C (36 and 46 degrees F). Do not freeze. Avoid exposure to heat. Get rid of any unopened medication after the expiration date. After the first use, store your pen for up to 30 days at room temperature between 20 and 25 degrees C (68 and 77 degrees F). Do not store with a needle attached to the pen. Use the pen for only 30 days. Get rid of your pen 30 days after first opening it even if the pen still contains medication. To get rid of medications that are no longer needed or have expired: Take the medication to a medication take-back program. Check with your pharmacy or law enforcement to find a location. If you cannot return the medication, ask your pharmacist or care team how to get rid of this medication safely. NOTE: This sheet is a summary. It may not cover all possible information. If you have questions about this medicine, talk to your doctor, pharmacist, or health care provider.  2024 Elsevier/Gold Standard (2022-10-08 00:00:00)   Fall Prevention in the Home, Adult Falls can cause injuries and affect people of all ages. There are many simple things that you can do to make your home safe and to help prevent falls. If you need it, ask for help making these changes. What actions can I take to prevent falls? General information Use good lighting in all rooms. Make sure to: Replace any light bulbs that burn out. Turn on lights if it is dark and use night-lights. Keep items that you use often in easy-to-reach places. Lower the shelves around your home if needed. Move furniture so that there are  clear paths around it. Do not keep throw rugs or other things on the floor that can make you trip. If any of your floors are uneven, fix them. Add color or contrast paint or tape to clearly mark and help you see: Grab bars or handrails. First and last steps of staircases. Where the edge of each step is. If you use a ladder or stepladder: Make sure that it is fully opened. Do not climb a closed ladder. Make sure the sides of the ladder are locked in place. Have someone hold the ladder while you use it. Know where your pets are as you move through your home. What can I do in the bathroom?     Keep the floor dry. Clean up any water that is on the floor right away. Remove soap buildup in the bathtub or shower. Buildup makes bathtubs and showers slippery. Use non-skid mats or decals on the floor of the bathtub or shower. Attach bath mats securely with double-sided, non-slip rug tape. If you need to sit down while you are in the shower, use a non-slip stool. Install grab bars by the  toilet and in the bathtub and shower. Do not use towel bars as grab bars. What can I do in the bedroom? Make sure that you have a light by your bed that is easy to reach. Do not use any sheets or blankets on your bed that hang to the floor. Have a firm bench or chair with side arms that you can use for support when you get dressed. What can I do in the kitchen? Clean up any spills right away. If you need to reach something above you, use a sturdy step stool that has a grab bar. Keep electrical cables out of the way. Do not use floor polish or wax that makes floors slippery. What can I do with my stairs? Do not leave anything on the stairs. Make sure that you have a light switch at the top and the bottom of the stairs. Have them installed if you do not have them. Make sure that there are handrails on both sides of the stairs. Fix handrails that are broken or loose. Make sure that handrails are as long as the  staircases. Install non-slip stair treads on all stairs in your home if they do not have carpet. Avoid having throw rugs at the top or bottom of stairs, or secure the rugs with carpet tape to prevent them from moving. Choose a carpet design that does not hide the edge of steps on the stairs. Make sure that carpet is firmly attached to the stairs. Fix any carpet that is loose or worn. What can I do on the outside of my home? Use bright outdoor lighting. Repair the edges of walkways and driveways and fix any cracks. Clear paths of anything that can make you trip, such as tools or rocks. Add color or contrast paint or tape to clearly mark and help you see high doorway thresholds. Trim any bushes or trees on the main path into your home. Check that handrails are securely fastened and in good repair. Both sides of all steps should have handrails. Install guardrails along the edges of any raised decks or porches. Have leaves, snow, and ice cleared regularly. Use sand, salt, or ice melt on walkways during winter months if you live where there is ice and snow. In the garage, clean up any spills right away, including grease or oil spills. What other actions can I take? Review your medicines with your health care provider. Some medicines can make you confused or feel dizzy. This can increase your chance of falling. Wear closed-toe shoes that fit well and support your feet. Wear shoes that have rubber soles and low heels. Use a cane, walker, scooter, or crutches that help you move around if needed. Talk with your provider about other ways that you can decrease your risk of falls. This may include seeing a physical therapist to learn to do exercises to improve movement and strength. Where to find more information Centers for Disease Control and Prevention, STEADI: TonerPromos.no [employer] on Aging: BaseRingTones.pl National Institute on Aging: BaseRingTones.pl Contact a health care provider if: You are afraid of  falling at home. You feel weak, drowsy, or dizzy at home. You fall at home. Get help right away if you: Lose consciousness or have trouble moving after a fall. Have a fall that causes a head injury. These symptoms may be an emergency. Get help right away. Call 911. Do not wait to see if the symptoms will go away. Do not drive yourself to the hospital. This information is  not intended to replace advice given to you by your health care provider. Make sure you discuss any questions you have with your health care provider. Document Revised: 03/15/2022 Document Reviewed: 03/15/2022 Elsevier Patient Education  2024 [employer].

## 2023-04-27 NOTE — Progress Notes (Signed)
Creatinine remains elevated but has improved slightly.   GFR remains low but has improved-55.  Avoid the use of NSAIDs.   Sodium and chloride are low and trending down.  Please forward results to PCP-she will likely require further workup.   Calcium remains elevated but is trending down.  Hold calcium supplement at this time.  ESR remains elevated but stable-38.

## 2023-04-29 ENCOUNTER — Telehealth: Payer: Self-pay | Admitting: *Deleted

## 2023-04-29 DIAGNOSIS — M359 Systemic involvement of connective tissue, unspecified: Secondary | ICD-10-CM

## 2023-04-29 DIAGNOSIS — M3501 Sicca syndrome with keratoconjunctivitis: Secondary | ICD-10-CM

## 2023-04-29 LAB — COMPLETE METABOLIC PANEL WITH GFR
AG Ratio: 1.1 (calc) (ref 1.0–2.5)
ALT: 14 U/L (ref 6–29)
AST: 28 U/L (ref 10–35)
Albumin: 3.9 g/dL (ref 3.6–5.1)
Alkaline phosphatase (APISO): 92 U/L (ref 37–153)
BUN/Creatinine Ratio: 22 (calc) (ref 6–22)
BUN: 22 mg/dL (ref 7–25)
CO2: 25 mmol/L (ref 20–32)
Calcium: 10.8 mg/dL — ABNORMAL HIGH (ref 8.6–10.4)
Chloride: 97 mmol/L — ABNORMAL LOW (ref 98–110)
Creat: 1.02 mg/dL — ABNORMAL HIGH (ref 0.60–0.95)
Globulin: 3.4 g/dL (ref 1.9–3.7)
Glucose, Bld: 94 mg/dL (ref 65–99)
Potassium: 5 mmol/L (ref 3.5–5.3)
Sodium: 129 mmol/L — ABNORMAL LOW (ref 135–146)
Total Bilirubin: 0.4 mg/dL (ref 0.2–1.2)
Total Protein: 7.3 g/dL (ref 6.1–8.1)
eGFR: 55 mL/min/{1.73_m2} — ABNORMAL LOW (ref >=60)

## 2023-04-29 LAB — PROTEIN ELECTROPHORESIS, SERUM, WITH REFLEX
Albumin ELP: 3.7 g/dL — ABNORMAL LOW (ref 3.8–4.8)
Alpha 1: 0.4 g/dL — ABNORMAL HIGH (ref 0.2–0.3)
Alpha 2: 0.8 g/dL (ref 0.5–0.9)
Beta 2: 0.6 g/dL — ABNORMAL HIGH (ref 0.2–0.5)
Beta Globulin: 0.5 g/dL (ref 0.4–0.6)
Gamma Globulin: 1.3 g/dL (ref 0.8–1.7)
Total Protein: 7.3 g/dL (ref 6.1–8.1)

## 2023-04-29 LAB — ANTI-NUCLEAR AB-TITER (ANA TITER): ANA Titer 1: 1:640 {titer} — ABNORMAL HIGH

## 2023-04-29 LAB — ANTI-DNA ANTIBODY, DOUBLE-STRANDED: ds DNA Ab: <1 [IU]/mL

## 2023-04-29 LAB — CBC WITH DIFFERENTIAL/PLATELET
Absolute Monocytes: 707 {cells}/uL (ref 200–950)
Basophils Absolute: 21 {cells}/uL (ref 0–200)
Basophils Relative: 0.3 %
Eosinophils Absolute: 42 {cells}/uL (ref 15–500)
Eosinophils Relative: 0.6 %
HCT: 39.8 % (ref 35.0–45.0)
Hemoglobin: 12.9 g/dL (ref 11.7–15.5)
Lymphs Abs: 1988 {cells}/uL (ref 850–3900)
MCH: 31 pg (ref 27.0–33.0)
MCHC: 32.4 g/dL (ref 32.0–36.0)
MCV: 95.7 fL (ref 80.0–100.0)
MPV: 10.7 fL (ref 7.5–12.5)
Monocytes Relative: 10.1 %
Neutro Abs: 4242 {cells}/uL (ref 1500–7800)
Neutrophils Relative %: 60.6 %
Platelets: 211 10*3/uL (ref 140–400)
RBC: 4.16 10*6/uL (ref 3.80–5.10)
RDW: 11.7 % (ref 11.0–15.0)
Total Lymphocyte: 28.4 %
WBC: 7 10*3/uL (ref 3.8–10.8)

## 2023-04-29 LAB — PROTEIN / CREATININE RATIO, URINE
Creatinine, Urine: 34 mg/dL (ref 20–275)
Protein/Creat Ratio: 794 mg/g{creat} — ABNORMAL HIGH (ref 24–184)
Protein/Creatinine Ratio: 0.794 mg/mg{creat} — ABNORMAL HIGH (ref 0.024–0.184)
Total Protein, Urine: 27 mg/dL — ABNORMAL HIGH (ref 5–24)

## 2023-04-29 LAB — C3 AND C4
C3 Complement: 140 mg/dL
C4 Complement: 24 mg/dL

## 2023-04-29 LAB — SEDIMENTATION RATE: Sed Rate: 38 mm/h — ABNORMAL HIGH (ref 0–30)

## 2023-04-29 LAB — ANA: Anti Nuclear Antibody (ANA): POSITIVE — AB

## 2023-04-29 NOTE — Progress Notes (Signed)
ANA remains positive.   Total urine protein is borderline elevated. Protein creatinine ratio is elevated.  Recommend rechecking in 3-4 weeks. dsDNA is negative. Complements WNL.  Labs are not consistent with a flare.

## 2023-04-29 NOTE — Telephone Encounter (Signed)
-----   Message from Gearldine Bienenstock sent at 04/29/2023  6:03 AM EDT ----- ANA remains positive.   Total urine protein is borderline elevated. Protein creatinine ratio is elevated.  Recommend rechecking in 3-4 weeks. dsDNA is negative. Complements WNL.  Labs are not consistent with a flare.

## 2023-04-29 NOTE — Progress Notes (Signed)
SPEP-no abnormal protein bands detected.

## 2023-05-21 DIAGNOSIS — Z23 Encounter for immunization: Secondary | ICD-10-CM | POA: Diagnosis not present

## 2023-06-02 ENCOUNTER — Ambulatory Visit: Payer: PPO | Admitting: Family Medicine

## 2023-06-02 ENCOUNTER — Encounter: Payer: Self-pay | Admitting: Family Medicine

## 2023-06-02 VITALS — BP 102/70 | HR 73 | Ht 64.5 in | Wt 131.0 lb

## 2023-06-02 DIAGNOSIS — Z87310 Personal history of (healed) osteoporosis fracture: Secondary | ICD-10-CM

## 2023-06-02 DIAGNOSIS — M810 Age-related osteoporosis without current pathological fracture: Secondary | ICD-10-CM

## 2023-06-02 NOTE — Patient Instructions (Signed)
Thank you for coming in today.   We will work to authorize forteo or [employer].   You should hear soon.

## 2023-06-02 NOTE — Progress Notes (Signed)
   I, Stevenson Clinch, CMA acting as a scribe for Clementeen Graham, MD.  Alice Medina is a 82 y.o. female who presents to [facility] at [facility] today for osteoporosis management. Hx of hypothyroid and RA.  DEXA scan (date, T-score): 02/01/23: AP spine= -2.8, L-FN= -2.7, R-FN= -2.7, R total forearm= -4.3 Prior treatment: Evista, Fosamax History of Hip, Spine, or Wrist Fx: bilat wrists, LEFT wrist OP fracture/fall from standing height.  She has had spontaneous calcaneal fractures as well. Heart disease or stroke: not within the past year, MI in 1990/1991, A Fib Cancer: yes- breast cancer and family hx - chemo Kidney Disease: no Gastric/Peptic Ulcer: yes Gastric bypass surgery: no Severe GERD: moderate - Prilosec Hx of seizures: no Age at Menopause: 23 Calcium intake: hypercalcemia - on supplement Vitamin D intake: yes, 2000iU Hormone replacement therapy: Evista Smoking history: never Alcohol: none Exercise: occasional Major dental work in past year: crown within the past year, needs a new crown Parents with hip/spine fracture: no Height loss: yes: 66.75"-64.5"      Pertinent review of systems: No fevers or chills  Relevant historical information: Multiple fractures.  Sjogren's syndrome.  History of breast cancer.   Exam:  BP 102/70   Pulse 73   Ht 5' 4.5" (1.638 m)   Wt 131 lb (59.4 kg)   SpO2 98%   BMI 22.14 kg/m  General: Well Developed, well nourished, and in no acute distress.   Normal gait.       Assessment and Plan: 82 y.o. female with severe osteoporosis with history of multiple fractures related to osteoporosis.  She has very low bone density with a T-score of -4.3.  She has a history of multiple fractures thought to be related to osteoporosis.  She has a history of having tried Fosamax in the past for "about 20 years ".  She also is taking raloxifene primary for breast cancer recurrence reduction but also for bone density.  At this point  the most likely effective medicine for her would be an anabolic agent such as Tymlos or Forteo.  These are going to be expensive and may be too expensive for her to use.  Will work on authorization of these medications to try to get a sense of cost.  If it is just too expensive would recommend Reclast infusion although this is less than ideal.   Discussed warning signs or symptoms. Please see discharge instructions. Patient expresses understanding.   The above documentation has been reviewed and is accurate and complete Clementeen Graham, M.D.  Total encounter time 45 minutes including face-to-face time with the patient and, reviewing past medical record, and charting on the date of service.

## 2023-06-03 ENCOUNTER — Telehealth: Payer: Self-pay

## 2023-06-03 DIAGNOSIS — Z87310 Personal history of (healed) osteoporosis fracture: Secondary | ICD-10-CM

## 2023-06-03 DIAGNOSIS — M810 Age-related osteoporosis without current pathological fracture: Secondary | ICD-10-CM

## 2023-06-03 NOTE — Telephone Encounter (Signed)
Prior Authorization initiated for [facility] via [email or web address] KEY: [id number]

## 2023-06-03 NOTE — Telephone Encounter (Signed)
-----   Message from [facility] sent at 06/02/2023  2:29 PM EST ----- Regarding: OP Tymlos or Forteo

## 2023-06-06 NOTE — Telephone Encounter (Signed)
Prior Auth for Alice Medina has been denied.   Pt meets clinical criteria. Appeal faxed to [facility].

## 2023-06-09 ENCOUNTER — Encounter: Payer: Self-pay | Admitting: Cardiology

## 2023-06-09 ENCOUNTER — Ambulatory Visit: Payer: PPO | Attending: Cardiology | Admitting: Cardiology

## 2023-06-09 VITALS — BP 112/60 | HR 57 | Ht 64.5 in | Wt 131.4 lb

## 2023-06-09 DIAGNOSIS — R001 Bradycardia, unspecified: Secondary | ICD-10-CM

## 2023-06-09 DIAGNOSIS — M810 Age-related osteoporosis without current pathological fracture: Secondary | ICD-10-CM | POA: Diagnosis not present

## 2023-06-09 DIAGNOSIS — I2510 Atherosclerotic heart disease of native coronary artery without angina pectoris: Secondary | ICD-10-CM | POA: Diagnosis not present

## 2023-06-09 DIAGNOSIS — E785 Hyperlipidemia, unspecified: Secondary | ICD-10-CM | POA: Diagnosis not present

## 2023-06-09 DIAGNOSIS — D6869 Other thrombophilia: Secondary | ICD-10-CM | POA: Diagnosis not present

## 2023-06-09 DIAGNOSIS — E041 Nontoxic single thyroid nodule: Secondary | ICD-10-CM | POA: Diagnosis not present

## 2023-06-09 DIAGNOSIS — N1831 Chronic kidney disease, stage 3a: Secondary | ICD-10-CM | POA: Diagnosis not present

## 2023-06-09 DIAGNOSIS — I480 Paroxysmal atrial fibrillation: Secondary | ICD-10-CM

## 2023-06-09 DIAGNOSIS — I129 Hypertensive chronic kidney disease with stage 1 through stage 4 chronic kidney disease, or unspecified chronic kidney disease: Secondary | ICD-10-CM | POA: Diagnosis not present

## 2023-06-09 DIAGNOSIS — E559 Vitamin D deficiency, unspecified: Secondary | ICD-10-CM | POA: Diagnosis not present

## 2023-06-09 DIAGNOSIS — E039 Hypothyroidism, unspecified: Secondary | ICD-10-CM | POA: Diagnosis not present

## 2023-06-09 NOTE — Progress Notes (Signed)
  Electrophysiology Office Note:   Date:  06/09/2023  ID:  Debbrah Alar, DOB 02/13/1941, MRN [id number]  Primary Cardiologist: Donato Schultz, MD Electrophysiologist: None      History of Present Illness:   Alice Medina is a 82 y.o. female with h/o coronary artery disease, Sjogren syndrome, hypertension, hyperlipidemia, atrial fibrillation seen today for  for Electrophysiology evaluation of atrial fibrillation at the request of Jorja Loa.    She is currently doing well.  She has no chest pain or shortness of breath.  She does continue to have episodes of atrial fibrillation, though her episodes are minimal.  She was initially on sotalol, but the dose was reduced.  She was having quite a bit of fatigue on twice daily dosing, but daily dosing, she has been improved.  She feels weak and fatigued when she is in atrial fibrillation, but her episodes are short-lived.  She did wear a cardiac monitor which was personally reviewed which showed a 0% atrial fibrillation burden.  Review of systems complete and found to be negative unless listed in HPI.   EP Information / Studies Reviewed:    EKG is ordered today. Personal review as below.  EKG Interpretation Date/Time:  Thursday June 09 2023 11:44:57 EST Ventricular Rate:  57 PR Interval:  276 QRS Duration:  80 QT Interval:  430 QTC Calculation: 418 R Axis:   86  Text Interpretation: Sinus bradycardia with 1st degree A-V block Anteroseptal infarct (cited on or before 07-Apr-2020) When compared with ECG of 08-Mar-2023 09:58, No significant change was found Confirmed by Tiauna Whisnant ([id number]) on 06/09/2023 11:47:22 AM     Risk Assessment/Calculations:    CHA2DS2-VASc Score = 5   This indicates a 7.2% annual risk of stroke. The patient's score is based upon: CHF History: 0 HTN History: 1 Diabetes History: 0 Stroke History: 0 Vascular Disease History: 1 Age Score: 2 Gender Score: 1             Physical Exam:   VS:  BP  112/60 (BP Location: Left Arm, Patient Position: Sitting, Cuff Size: Normal)   Pulse (!) 57   Ht 5' 4.5" (1.638 m)   Wt 131 lb 6.4 oz (59.6 kg)   SpO2 99%   BMI 22.21 kg/m    Wt Readings from Last 3 Encounters:  06/09/23 131 lb 6.4 oz (59.6 kg)  06/02/23 131 lb (59.4 kg)  04/26/23 130 lb 6.4 oz (59.1 kg)     GEN: Well nourished, well developed in no acute distress NECK: No JVD; No carotid bruits CARDIAC: Regular rate and rhythm, no murmurs, rubs, gallops RESPIRATORY:  Clear to auscultation without rales, wheezing or rhonchi  ABDOMEN: Soft, non-tender, non-distended EXTREMITIES:  No edema; No deformity   ASSESSMENT AND PLAN:    1.  Paroxysmal atrial fibrillation: Currently on sotalol.  She continues to have episodes of atrial fibrillation, though her episodes are rare.  She is happy with her control.  No changes.  2.  Secondary hypercoagulable state: Currently on Eliquis for atrial fibrillation  3.  Hypertension: Currently well-controlled  4.  Coronary artery disease: No current angina.  Plan per primary cardiology  Follow up with [facility] in 6 months  Signed, Deleon Passe Jorja Loa, MD

## 2023-06-09 NOTE — Patient Instructions (Signed)
Medication Instructions:  Your physician recommends that you continue on your current medications as directed. Please refer to the Current Medication list given to you today.  *If you need a refill on your cardiac medications before your next appointment, please call your pharmacy*   Lab Work: None ordered   Testing/Procedures: None ordered   Follow-Up: At [facility], you and your health needs are our priority.  As part of our continuing mission to provide you with exceptional heart care, we have created designated Provider Care Teams.  These Care Teams include your primary Cardiologist (physician) and Advanced Practice Providers (APPs -  Physician Assistants and Nurse Practitioners) who all work together to provide you with the care you need, when you need it.  Your next appointment:   6 month(s)  The format for your next appointment:   In Person  Provider:   You will follow up in the Atrial Fibrillation Clinic located at [facility]. Your provider will be: Clint R. Fenton, PA-C or Lake Bells, PA-C    Thank you for choosing [facility]!!   Dory Horn, RN [phone]

## 2023-06-17 DIAGNOSIS — I7300 Raynaud's syndrome without gangrene: Secondary | ICD-10-CM | POA: Diagnosis not present

## 2023-06-17 DIAGNOSIS — Z1339 Encounter for screening examination for other mental health and behavioral disorders: Secondary | ICD-10-CM | POA: Diagnosis not present

## 2023-06-17 DIAGNOSIS — R82998 Other abnormal findings in urine: Secondary | ICD-10-CM | POA: Diagnosis not present

## 2023-06-17 DIAGNOSIS — G629 Polyneuropathy, unspecified: Secondary | ICD-10-CM | POA: Diagnosis not present

## 2023-06-17 DIAGNOSIS — F1320 Sedative, hypnotic or anxiolytic dependence, uncomplicated: Secondary | ICD-10-CM | POA: Diagnosis not present

## 2023-06-17 DIAGNOSIS — F325 Major depressive disorder, single episode, in full remission: Secondary | ICD-10-CM | POA: Diagnosis not present

## 2023-06-17 DIAGNOSIS — F418 Other specified anxiety disorders: Secondary | ICD-10-CM | POA: Diagnosis not present

## 2023-06-17 DIAGNOSIS — I480 Paroxysmal atrial fibrillation: Secondary | ICD-10-CM | POA: Diagnosis not present

## 2023-06-17 DIAGNOSIS — Z0000 Encounter for general adult medical examination without abnormal findings: Secondary | ICD-10-CM | POA: Diagnosis not present

## 2023-06-17 DIAGNOSIS — M3500 Sicca syndrome, unspecified: Secondary | ICD-10-CM | POA: Diagnosis not present

## 2023-06-17 DIAGNOSIS — M810 Age-related osteoporosis without current pathological fracture: Secondary | ICD-10-CM | POA: Diagnosis not present

## 2023-06-17 DIAGNOSIS — D899 Disorder involving the immune mechanism, unspecified: Secondary | ICD-10-CM | POA: Diagnosis not present

## 2023-06-17 DIAGNOSIS — I1310 Hypertensive heart and chronic kidney disease without heart failure, with stage 1 through stage 4 chronic kidney disease, or unspecified chronic kidney disease: Secondary | ICD-10-CM | POA: Diagnosis not present

## 2023-06-17 DIAGNOSIS — Z1331 Encounter for screening for depression: Secondary | ICD-10-CM | POA: Diagnosis not present

## 2023-06-17 DIAGNOSIS — N1831 Chronic kidney disease, stage 3a: Secondary | ICD-10-CM | POA: Diagnosis not present

## 2023-06-17 DIAGNOSIS — M3210 Systemic lupus erythematosus, organ or system involvement unspecified: Secondary | ICD-10-CM | POA: Diagnosis not present

## 2023-06-20 MED ORDER — PEN NEEDLES 31G X 5 MM MISC
1.0000 | Freq: Every day | 11 refills | Status: DC
Start: 2023-06-20 — End: 2023-12-08

## 2023-06-20 MED ORDER — TYMLOS 3120 MCG/1.56ML ~~LOC~~ SOPN
80.0000 ug | PEN_INJECTOR | Freq: Every day | SUBCUTANEOUS | 11 refills | Status: DC
Start: 2023-06-20 — End: 2023-07-05

## 2023-06-20 NOTE — Addendum Note (Signed)
Addended by: Dierdre Searles on: 06/20/2023 03:47 PM   Modules accepted: Orders

## 2023-06-20 NOTE — Telephone Encounter (Signed)
Rx for Tymlos and Pen Needles [facility].   [facility] is preferred mail order pharmacy for [facility].

## 2023-06-20 NOTE — Telephone Encounter (Signed)
Spoke with patient, will send RX to [facility] [address] Pt will let me know if they are unable to fill Rx.

## 2023-06-20 NOTE — Telephone Encounter (Signed)
Reconsideration request outcome received and has been  APPROVED PA# [id number] Valid: 06/11/23-06/06/24

## 2023-06-30 ENCOUNTER — Other Ambulatory Visit (HOSPITAL_COMMUNITY): Payer: Self-pay

## 2023-06-30 ENCOUNTER — Other Ambulatory Visit: Payer: Self-pay

## 2023-06-30 NOTE — Telephone Encounter (Signed)
Spoke to patient. She said that this would be out of her price range, so she will not be able to proceed at this time.

## 2023-06-30 NOTE — Telephone Encounter (Signed)
Patient called asking if we could sent this to [facility] and asked if it could be sent as a "test claim" so that they will know the cost for her.

## 2023-06-30 NOTE — Telephone Encounter (Signed)
With [employer], copay is showing [phone]

## 2023-07-04 ENCOUNTER — Other Ambulatory Visit: Payer: Self-pay | Admitting: Physician Assistant

## 2023-07-04 DIAGNOSIS — M3501 Sicca syndrome with keratoconjunctivitis: Secondary | ICD-10-CM

## 2023-07-04 DIAGNOSIS — Z79899 Other long term (current) drug therapy: Secondary | ICD-10-CM

## 2023-07-04 DIAGNOSIS — M359 Systemic involvement of connective tissue, unspecified: Secondary | ICD-10-CM

## 2023-07-04 NOTE — Telephone Encounter (Signed)
Forwarding to Dr. Denyse Amass to advise regarding alternative treatment option(s).

## 2023-07-05 ENCOUNTER — Telehealth: Payer: Self-pay

## 2023-07-05 DIAGNOSIS — M810 Age-related osteoporosis without current pathological fracture: Secondary | ICD-10-CM

## 2023-07-05 DIAGNOSIS — Z87310 Personal history of (healed) osteoporosis fracture: Secondary | ICD-10-CM

## 2023-07-05 MED ORDER — ZOLEDRONIC ACID 5 MG/100ML IV SOLN
5.0000 mg | Freq: Once | INTRAVENOUS | Status: DC
Start: 2023-07-19 — End: 2023-12-08

## 2023-07-05 NOTE — Telephone Encounter (Signed)
Rodolph Bong, MD  [facility] Clinical56 minutes ago (7:22 AM)    Next option is Reclast infusion.  Would you like me to get that set up?      Note   You  Rodolph Bong, MD16 hours ago (4:03 PM)    Forwarding to Dr. Denyse Amass to advise regarding alternative treatment option(s).       Note   Morphies, Hermine Messick  You5 days ago   CM Spoke to patient. She said that this would be out of her price range, so she will not be able to proceed at this time.

## 2023-07-05 NOTE — Telephone Encounter (Signed)
Next option is Reclast infusion.  Would you like me to get that set up?

## 2023-07-05 NOTE — Addendum Note (Signed)
Addended by: Dierdre Searles on: 07/05/2023 03:49 PM   Modules accepted: Orders

## 2023-07-07 NOTE — Telephone Encounter (Signed)
A user error has taken place: encounter opened in error, closed for administrative reasons.

## 2023-07-11 ENCOUNTER — Other Ambulatory Visit: Payer: Self-pay

## 2023-07-11 ENCOUNTER — Telehealth: Payer: Self-pay | Admitting: Family Medicine

## 2023-07-11 ENCOUNTER — Telehealth: Payer: Self-pay

## 2023-07-11 NOTE — Telephone Encounter (Signed)
Trying to order Reclast through the infusion navigator

## 2023-07-11 NOTE — Telephone Encounter (Signed)
Auth Submission: NO AUTH NEEDED Site of care: Site of care: [facility] Payer: [employer] Medication & CPT/J Code(s) submitted: Reclast (Zolendronic acid) [id number] Route of submission (phone, fax, portal):  Phone # Fax # Auth type: Buy/Bill PB Units/visits requested: 5mg  x 1 dose Reference number:  Approval from: 07/11/23 to 07/26/23

## 2023-07-11 NOTE — Telephone Encounter (Signed)
Order has been placed for Reclast infusion with the [facility].

## 2023-07-12 ENCOUNTER — Telehealth: Payer: Self-pay

## 2023-07-12 NOTE — Telephone Encounter (Signed)
Hello, Patient is scheduled for 07/15/23 at 12p.  Auth Submission: NO AUTH NEEDED Site of care: Site of care: [facility] Payer: [employer] Medication & CPT/J Code(s) submitted: Reclast (Zolendronic acid) Z6109 Route of submission (phone, fax, portal): phone Phone # Fax # Auth type: Buy/Bill PB Units/visits requested: 1 dose Reference number: [id number] Approval from: 07/12/23 to 07/26/23

## 2023-07-12 NOTE — Telephone Encounter (Signed)
No, patient is ready to go and we will arrange the appointment. Moving forward, I can tag Coralyn Pear on these patients. Thank you

## 2023-07-13 ENCOUNTER — Other Ambulatory Visit (HOSPITAL_COMMUNITY): Payer: Self-pay | Admitting: Pharmacy Technician

## 2023-07-13 NOTE — Telephone Encounter (Signed)
Alice Medina, CPhTYesterday (8:34 AM)    Hello, Patient is scheduled for 07/15/23 at 12p.   Auth Submission: NO AUTH NEEDED Site of care: Site of care: [facility] Payer: [employer] Medication & CPT/J Code(s) submitted: Reclast (Zolendronic acid) Z6109 Route of submission (phone, fax, portal): phone Phone # Fax # Auth type: Buy/Bill PB Units/visits requested: 1 dose Reference number: [id number] Approval from: 07/12/23 to 07/26/23

## 2023-07-13 NOTE — Telephone Encounter (Signed)
Noted, thank you

## 2023-07-15 ENCOUNTER — Inpatient Hospital Stay (HOSPITAL_COMMUNITY): Admission: RE | Admit: 2023-07-15 | Payer: PPO | Source: Ambulatory Visit

## 2023-08-03 DIAGNOSIS — D485 Neoplasm of uncertain behavior of skin: Secondary | ICD-10-CM | POA: Diagnosis not present

## 2023-08-03 DIAGNOSIS — L821 Other seborrheic keratosis: Secondary | ICD-10-CM | POA: Diagnosis not present

## 2023-08-03 DIAGNOSIS — L309 Dermatitis, unspecified: Secondary | ICD-10-CM | POA: Diagnosis not present

## 2023-08-08 ENCOUNTER — Other Ambulatory Visit: Payer: Self-pay | Admitting: Physician Assistant

## 2023-08-08 DIAGNOSIS — Z79899 Other long term (current) drug therapy: Secondary | ICD-10-CM

## 2023-08-08 DIAGNOSIS — M3501 Sicca syndrome with keratoconjunctivitis: Secondary | ICD-10-CM

## 2023-08-08 DIAGNOSIS — M359 Systemic involvement of connective tissue, unspecified: Secondary | ICD-10-CM

## 2023-08-08 NOTE — Telephone Encounter (Signed)
 Last Fill: 04/26/2023  Eye exam: 04/15/2023 WNL    Labs: 04/26/2023 Creatinine remains elevated but has improved slightly.   GFR remains low but has improved-55. Sodium and chloride are low and trending down. Calcium remains elevated but is trending down.   Next Visit: 09/01/2023  Last Visit: 04/26/2023  DX:Autoimmune disease   Current Dose per office note 04/26/2023: Plaquenil  200 mg 1 tablet by mouth BID Monday through Friday.   Okay to refill Plaquenil ?

## 2023-08-16 NOTE — Telephone Encounter (Signed)
Appointment 07/15/23 cancelled, not rescheduled.

## 2023-08-18 NOTE — Progress Notes (Signed)
 Office Visit Note  Patient: Alice Medina             Date of Birth: Jan 26, 1941           MRN: [id number]             PCP: Onita Rush, MD Referring: Onita Rush, MD Visit Date: 09/01/2023 Occupation: @GUAROCC @  Subjective:  No chief complaint on file.   History of Present Illness: Alice Medina is a 83 y.o. female with autoimmune disease, Sjogren's, Raynaud's, osteoarthritis and osteoporosis.  She returns today after her last visit in October 2024.  She has been taking hydroxychloroquine  1 tablet by mouth twice daily Monday to Friday without any interruption.  She continues to have Raynauds symptoms.  She complains of dry mouth and dry eyes.  She gets occasional nasal sores.  She continues to have pain and discomfort in multiple joints.  She states the Raynauds has been more active during the winter months.  She denies any photosensitivity or malar rash.  Patient states that she had a discussion with her endocrinologist and is planning to start on Tymlos .  She is trying to check with insurance as the cost is too much for the monthly Tymlos .    Activities of Daily Living:  Patient reports morning stiffness for 30 minutes.   Patient Denies nocturnal pain.  Difficulty dressing/grooming: Denies Difficulty climbing stairs: Reports Difficulty getting out of chair: Reports Difficulty using hands for taps, buttons, cutlery, and/or writing: Reports  Review of Systems  Constitutional:  Positive for fatigue.  HENT:  Positive for mouth dryness. Negative for mouth sores.        Nose sores  Eyes:  Positive for dryness.  Respiratory:  Positive for shortness of breath.   Cardiovascular:  Positive for palpitations. Negative for chest pain.  Gastrointestinal:  Positive for constipation and diarrhea. Negative for blood in stool.  Endocrine: Negative for increased urination.  Genitourinary:  Positive for involuntary urination.  Musculoskeletal:  Positive for joint pain, joint pain, myalgias,  muscle weakness, morning stiffness, muscle tenderness and myalgias. Negative for gait problem and joint swelling.  Skin:  Positive for color change and rash. Negative for sensitivity to sunlight.  Allergic/Immunologic: Negative for susceptible to infections.  Neurological:  Positive for dizziness and headaches.  Hematological:  Negative for swollen glands.  Psychiatric/Behavioral:  Negative for depressed mood and sleep disturbance. The patient is nervous/anxious.     PMFS History:  Patient Active Problem List   Diagnosis Date Noted   Paroxysmal atrial fibrillation (HCC) 04/07/2020   Hypercoagulable state due to paroxysmal atrial fibrillation (HCC) 04/07/2020   Multiple closed fractures of metacarpal bone 07/25/2017   Autoimmune disease (HCC) +ANA +Ro +La +RF oral ulcers nasal ulcers Raynaud photosensitivity [facility]  01/13/2017   High risk medication use 01/13/2017   Photosensitivity 01/13/2017   Sjogren's syndrome with keratoconjunctivitis sicca (HCC) 01/13/2017   Recurrent oral ulcers and nasal ulcers  01/13/2017   Raynaud's disease without gangrene 01/13/2017   Primary osteoarthritis of both hands 01/13/2017   Primary osteoarthritis of both knees 01/13/2017   Primary osteoarthritis of both feet 01/13/2017   Osteoporosis 01/13/2017   History of hypertension 01/13/2017   History of hypothyroidism 01/13/2017   History of gastroesophageal reflux (GERD)/ PUD 01/13/2017   History of MRSA infection 01/13/2017   Diarrhea 05/28/2013   Family history of malignant neoplasm of gastrointestinal tract 06/22/2011   Malignant neoplasm of female breast (HCC) 05/02/2009   Coronary atherosclerosis 05/02/2009   Dysphagia 05/02/2009  Past Medical History:  Diagnosis Date   ADENOCARCINOMA, BREAST 05/02/2009   Qualifier: Diagnosis of  By: Debrah MD, Lamar BIRCH    Anxiety    Arthritis    Autoimmune disorder (HCC)    SJORGREN'S   Breast cancer (HCC)    right breast   Chronic headaches    CORONARY  ARTERY DISEASE    cath in 1990 (normal Left main, normal CFX, normal RCA, myocardial bridge in LAD per Dr. Gabe note)   Depression    Diarrhea 05/28/2013   DYSPHAGIA UNSPECIFIED 05/02/2009   Qualifier: Diagnosis of  By: Debrah MD, Lamar BIRCH    Family history of malignant neoplasm of gastrointestinal tract 06/22/2011   Brother with colon cancer    GERD (gastroesophageal reflux disease)    Hemorrhage of rectum and anus 06/22/2011   History of hypothyroidism 01/13/2017   History of MRSA infection 01/13/2017   Hypertension    Multiple closed fractures of metacarpal bone 07/25/2017   Myocardial bridge    Myocardial infarction [facility])    1990   Neuromuscular disorder (HCC)    LUPUS   Osteopenia of multiple sites 01/13/2017   Osteoporosis    PAC (premature atrial contraction) 04/07/2020   PAF (paroxysmal atrial fibrillation) (HCC)    Photosensitivity 01/13/2017   Primary osteoarthritis of both hands 01/13/2017   Raynaud's disease without gangrene 01/13/2017   Recurrent oral ulcers and nasal ulcers  01/13/2017   Sjogren's syndrome with keratoconjunctivitis sicca (HCC) 01/13/2017   Status post dilation of esophageal narrowing    Thyroid  disease    Ulcer     Family History  Problem Relation Age of Onset   Breast cancer Mother    Lung cancer Mother    Cancer Father        larynx   Heart failure Father    Breast cancer Sister    Cancer Sister        thyroid     Heart disease Brother    Colon cancer Brother    Breast cancer Maternal Grandmother    Autoimmune disease Daughter    Cancer Son        prostate    Diabetes Son    Rheum arthritis Grandchild    Alopecia Grandchild    Pseudochol deficiency Other    Esophageal cancer Neg Hx    Rectal cancer Neg Hx    Stomach cancer Neg Hx    Past Surgical History:  Procedure Laterality Date   APPENDECTOMY     breast reconstuction     x 2   CHOLECYSTECTOMY     COLONOSCOPY     double mastectomy     PARTIAL HYSTERECTOMY  1990    TIBIA FRACTURE SURGERY Right    with subsequent hardware removal   UPPER GASTROINTESTINAL ENDOSCOPY     WRIST FRACTURE SURGERY Left    Social History   Social History Narrative   1 cup of coffee daily   Immunization History  Administered Date(s) Administered   Influenza Split 05/07/2010, 05/14/2011, 05/12/2012, 04/19/2013, 04/15/2014   Influenza, Quadrivalent, Recombinant, Inj, Pf 04/22/2018, 04/07/2019, 05/10/2020   Influenza,inj,Quad PF,6+ Mos 04/15/2014, 05/19/2015   Influenza-Unspecified 04/24/2016, 04/23/2017   PFIZER(Purple Top)SARS-COV-2 Vaccination 09/03/2019, 09/28/2019, 06/27/2020   Pneumococcal Conjugate-13 11/20/2013, 12/24/2014   Pneumococcal Polysaccharide-23 09/08/2007, 01/06/2018   Td,absorbed, Preservative Free, Adult Use, Lf Unspecified 04/13/2011   Tdap 10/29/2009   Tetanus 04/03/2022   Unspecified SARS-COV-2 Vaccination 09/03/2019, 09/28/2019   Zoster Recombinant(Shingrix) 01/10/2018, 05/11/2018, 09/19/2018   Zoster, Live 11/20/2013, 01/13/2018, 05/11/2018  Objective: Vital Signs: BP 113/73 (BP Location: Left Arm, Patient Position: Sitting, Cuff Size: Normal)   Pulse 65   Resp 13   Ht 5' 4.5 (1.638 m)   Wt 128 lb (58.1 kg)   BMI 21.63 kg/m    Physical Exam Vitals and nursing note reviewed.  Constitutional:      Appearance: She is well-developed.  HENT:     Head: Normocephalic and atraumatic.  Eyes:     Conjunctiva/sclera: Conjunctivae normal.  Cardiovascular:     Rate and Rhythm: Normal rate and regular rhythm.     Heart sounds: Normal heart sounds.  Pulmonary:     Effort: Pulmonary effort is normal.     Breath sounds: Normal breath sounds.  Abdominal:     General: Bowel sounds are normal.     Palpations: Abdomen is soft.  Musculoskeletal:     Cervical back: Normal range of motion.  Lymphadenopathy:     Cervical: No cervical adenopathy.  Skin:    General: Skin is warm and dry.     Capillary Refill: Capillary refill takes less than 2  seconds.  Neurological:     Mental Status: She is alert and oriented to person, place, and time.  Psychiatric:        Behavior: Behavior normal.      Musculoskeletal Exam: Patient had limited lateral rotation of the cervical spine.  She had no tenderness over thoracic or lumbar spine.  Shoulders, elbows, wrist joints, MCPs PIPs and DIPs with good range of motion without any synovitis.  PIP and DIP thickening was noted.  Hip joints were in good range of motion.  She had tenderness over left trochanteric bursa.  Knee joints were in good range of motion without any warmth swelling or effusion.  There was no tenderness over ankles or MTPs.  CDAI Exam: CDAI Score: -- Patient Global: --; Provider Global: -- Swollen: --; Tender: -- Joint Exam 09/01/2023   No joint exam has been documented for this visit   There is currently no information documented on the homunculus. Go to the Rheumatology activity and complete the homunculus joint exam.  Investigation: No additional findings.  Imaging: No results found.  Recent Labs: Lab Results  Component Value Date   WBC 7.0 04/26/2023   HGB 12.9 04/26/2023   PLT 211 04/26/2023   NA 129 (L) 04/26/2023   K 5.0 04/26/2023   CL 97 (L) 04/26/2023   CO2 25 04/26/2023   GLUCOSE 94 04/26/2023   BUN 22 04/26/2023   CREATININE 1.02 (H) 04/26/2023   BILITOT 0.4 04/26/2023   ALKPHOS 79 12/13/2017   AST 28 04/26/2023   ALT 14 04/26/2023   PROT 7.3 04/26/2023   PROT 7.3 04/26/2023   ALBUMIN 3.6 12/13/2017   CALCIUM 10.8 (H) 04/26/2023   GFRAA 81 10/16/2020    Speciality Comments: PLQ Eye Exam: 04/15/2023 WNL ( Per [facility] difficult to assess due to ARMD. Patient to follow up with Dr. Jarold in 6 months.) (Per [place], obtain records from Dr. Jarold once completed.)  Procedures:  No procedures performed Allergies: Sulfa antibiotics, Codeine, Robinul [glycopyrrolate], and Sulfamethoxazole-trimethoprim   Assessment / Plan:      Visit Diagnoses: Autoimmune disease (HCC) - +ANA +Ro +La +RF oral ulcers nasal ulcers Raynaud photosensitivity, sicca: -Patient continues to have pain and stiffness in multiple joints.  No swelling or synovitis was noted on the examination.  She continues to have sicca symptoms.  She complains of Raynaud's phenomenon.  She has been on  Plaquenil  without any interruption and has been tolerating it well.  Plan: Protein / creatinine ratio, urine, CBC with Differential/Platelet, COMPLETE METABOLIC PANEL WITH GFR, Anti-DNA antibody, double-stranded, C3 and C4, Sedimentation rate with her next labs in March.  Sjogren's syndrome with keratoconjunctivitis sicca (HCC) - sicca symptoms, positive Ro, positive loss antibody.  Rheumatoid factor has been negative.  Over-the-counter products were discussed.  High risk medication use - Plaquenil  200 mg 1 tablet by mouth BID Monday through Friday. PLQ Eye Exam: 04/15/2023.  April 26, 2023 CBC normal, CMP showed elevated creatinine 1.02 and calcium was mildly elevated at 10.8.  Sed rate remains mildly elevated.  Double-stranded DNA was negative.  Labs were reviewed with the patient.  She will have repeat labs in March.  Information on immunization was placed in the AVS.  Raynaud's disease without gangrene-she continues to have Raynaud's symptoms in her hands and feet especially during the winter months.  Keeping core temperature warm and warm clothing was discussed.  Chronic right shoulder pain-she continues to have some discomfort.  No warmth swelling or effusion was noted.  Primary osteoarthritis of both hands-joint protection muscle strengthening was discussed.  Trochanteric bursitis, left hip -I would avoid cortisone injection due to severe osteoporosis.  IT band stretches were discussed and a handout was given.  Patient declined referral to physical therapy. Primary osteoarthritis of both knees  Primary osteoarthritis of both feet-chronic pain.  Proper fitting  shoes were advised.  History of foot fracture - Closed fracture of left calcaneus-11/16/21.  Age-related osteoporosis without current pathological fracture - D[facility]: Right total femur T-score -4.3 BMD 0.349.  Frequent falls and previous fractures.  Patient states she is planning to start on Tymlos  through her endocrinologist.  Vitamin D  deficiency-she has been taking vitamin D .  Last vitamin D  was normal in 2023 in our system.  Other medical problems are listed as follows:  History of gastroesophageal reflux (GERD)/ PUD  History of hypertension  History of atrial fibrillation  History of MRSA infection  History of hypothyroidism  Orders: Orders Placed This Encounter  Procedures   Protein / creatinine ratio, urine   CBC with Differential/Platelet   COMPLETE METABOLIC PANEL WITH GFR   Anti-DNA antibody, double-stranded   C3 and C4   Sedimentation rate   No orders of the defined types were placed in this encounter.    Follow-Up Instructions: Return in about 5 months (around 01/29/2024) for Autoimmune disease, Osteoarthritis.   Maya Nash, MD  Note - This record has been created using Animal nutritionist.  Chart creation errors have been sought, but may not always  have been located. Such creation errors do not reflect on  the standard of medical care.

## 2023-09-01 ENCOUNTER — Ambulatory Visit: Payer: PPO | Attending: Rheumatology | Admitting: Rheumatology

## 2023-09-01 ENCOUNTER — Encounter: Payer: Self-pay | Admitting: Rheumatology

## 2023-09-01 VITALS — BP 113/73 | HR 65 | Resp 13 | Ht 64.5 in | Wt 128.0 lb

## 2023-09-01 DIAGNOSIS — E559 Vitamin D deficiency, unspecified: Secondary | ICD-10-CM | POA: Diagnosis not present

## 2023-09-01 DIAGNOSIS — Z8781 Personal history of (healed) traumatic fracture: Secondary | ICD-10-CM | POA: Diagnosis not present

## 2023-09-01 DIAGNOSIS — M810 Age-related osteoporosis without current pathological fracture: Secondary | ICD-10-CM

## 2023-09-01 DIAGNOSIS — M359 Systemic involvement of connective tissue, unspecified: Secondary | ICD-10-CM | POA: Diagnosis not present

## 2023-09-01 DIAGNOSIS — Z8639 Personal history of other endocrine, nutritional and metabolic disease: Secondary | ICD-10-CM

## 2023-09-01 DIAGNOSIS — M25511 Pain in right shoulder: Secondary | ICD-10-CM | POA: Diagnosis not present

## 2023-09-01 DIAGNOSIS — I7300 Raynaud's syndrome without gangrene: Secondary | ICD-10-CM | POA: Diagnosis not present

## 2023-09-01 DIAGNOSIS — M7062 Trochanteric bursitis, left hip: Secondary | ICD-10-CM

## 2023-09-01 DIAGNOSIS — M19071 Primary osteoarthritis, right ankle and foot: Secondary | ICD-10-CM | POA: Diagnosis not present

## 2023-09-01 DIAGNOSIS — M3501 Sicca syndrome with keratoconjunctivitis: Secondary | ICD-10-CM

## 2023-09-01 DIAGNOSIS — M19072 Primary osteoarthritis, left ankle and foot: Secondary | ICD-10-CM

## 2023-09-01 DIAGNOSIS — Z8719 Personal history of other diseases of the digestive system: Secondary | ICD-10-CM

## 2023-09-01 DIAGNOSIS — Z79899 Other long term (current) drug therapy: Secondary | ICD-10-CM

## 2023-09-01 DIAGNOSIS — Z8679 Personal history of other diseases of the circulatory system: Secondary | ICD-10-CM

## 2023-09-01 DIAGNOSIS — M19041 Primary osteoarthritis, right hand: Secondary | ICD-10-CM | POA: Diagnosis not present

## 2023-09-01 DIAGNOSIS — Z8614 Personal history of Methicillin resistant Staphylococcus aureus infection: Secondary | ICD-10-CM

## 2023-09-01 DIAGNOSIS — M170 Bilateral primary osteoarthritis of knee: Secondary | ICD-10-CM

## 2023-09-01 DIAGNOSIS — M19042 Primary osteoarthritis, left hand: Secondary | ICD-10-CM

## 2023-09-01 DIAGNOSIS — G8929 Other chronic pain: Secondary | ICD-10-CM

## 2023-09-01 NOTE — Patient Instructions (Signed)
 Standing Labs We placed an order today for your standing lab work.   Please have your standing labs drawn in March  Please have your labs drawn 2 weeks prior to your appointment so that the provider can discuss your lab results at your appointment, if possible.  Please note that you may see your imaging and lab results in MyChart before we have reviewed them. We will contact you once all results are reviewed. Please allow our office up to 72 hours to thoroughly review all of the results before contacting the office for clarification of your results.  WALK-IN LAB HOURS  Monday through Thursday from 8:00 am -12:30 pm and 1:00 pm-5:00 pm and Friday from 8:00 am-12:00 pm.  Patients with office visits requiring labs will be seen before walk-in labs.  You may encounter longer than normal wait times. Please allow additional time. Wait times may be shorter on  Monday and Thursday afternoons.  We do not book appointments for walk-in labs. We appreciate your patience and understanding with our staff.   Labs are drawn by [facility]. Please bring your co-pay at the time of your lab draw.  You may receive a bill from [facility] for your lab work.  Please note if you are on Hydroxychloroquine  and and an order has been placed for a Hydroxychloroquine  level,  you will need to have it drawn 4 hours or more after your last dose.  If you wish to have your labs drawn at another location, please call the office 24 hours in advance so we can fax the orders.  The office is located at [address], [address], [place], [place] [address]   If you have any questions regarding directions or hours of operation,  please call [phone].   As a reminder, please drink plenty of water prior to coming for your lab work. Thanks!   Vaccines You are taking a medication(s) that can suppress your immune system.  The following immunizations are recommended: Flu annually Covid-19  Td/Tdap (tetanus, diphtheria, pertussis) every  10 years Pneumonia (Prevnar 15 then Pneumovax 23 at least 1 year apart.  Alternatively, can take Prevnar 20 without needing additional dose) Shingrix: 2 doses from 4 weeks to 6 months apart  Please check with your PCP to make sure you are up to date.   Iliotibial Band Syndrome Rehab Ask your health care provider which exercises are safe for you. Do exercises exactly as told by your provider and adjust them as told. It's normal to feel mild stretching, pulling, tightness, or discomfort as you do these exercises. Stop right away if you feel sudden pain or your pain gets a lot worse. Do not begin these exercises until told by your provider. Stretching and range-of-motion exercises These exercises warm up your muscles and joints. They also improve the movement and flexibility of your hip and pelvis. Quadriceps stretch, prone  Lie face down (prone) on a firm surface like a bed or padded floor. Bend your left / right knee. Reach back to hold your ankle or pant leg. If you can't reach your ankle or pant leg, use a belt looped around your foot and grab the belt instead. Gently pull your heel toward your butt. Your knee should not slide out to the side. You should feel a stretch in the front of your thigh and knee, also called the quadriceps. Hold this position for __________ seconds. Repeat __________ times. Complete this exercise __________ times a day. Iliotibial band stretch The iliotibial band is a  strip of tissue that runs along the outside of your hip down to your knee. Lie on your side with your left / right leg on top. Bend both knees and grab your left / right ankle. Stretch out your bottom arm to help you balance. Slowly bring your top knee back so your thigh goes behind your back. Slowly lower your top leg toward the floor until you feel a gentle stretch on the outside of your left / right hip and thigh. If you don't feel a stretch and your knee won't go farther, place the heel of your other  foot on top of your knee and pull your knee down toward the floor with your foot. Hold this position for __________ seconds. Repeat __________ times. Complete this exercise __________ times a day. Strengthening exercises These exercises build strength and endurance in your hip and pelvis. Endurance means your muscles can keep working even when they're tired. Straight leg raises, side-lying This exercise strengthens the muscles that rotate the leg at the hip and move it away from your body. These muscles are called hip abductors. Lie on your side with your left / right leg on top. Lie so your head, shoulder, hip, and knee line up. You can bend your bottom knee to help you balance. Roll your hips slightly forward so they're stacked directly over each other. Your left / right knee should face forward. Tense the muscles in your outer thigh and hip. Lift your top leg 4-6 inches (10-15 cm) off the ground. Hold this position for __________ seconds. Slowly lower your leg back down to the starting position. Let your muscles fully relax before doing this exercise again. Repeat __________ times. Complete this exercise __________ times a day. Leg raises, prone This exercise strengthens the muscles that move the hips backward. These muscles are called hip extensors. Lie face down (prone) on your bed or a firm surface. You can put a pillow under your hips for comfort and to support your lower back. Bend your left / right knee so your foot points straight up toward the ceiling. Keep the other leg straight and behind you. Squeeze your butt muscles. Lift your left / right thigh off the firm surface. Do not let your back arch. Tense your thigh muscle as hard as you can without having more knee pain. Hold this position for __________ seconds. Slowly lower your leg to the starting position. Allow your leg to relax all the way. Repeat __________ times. Complete this exercise __________ times a day. Hip  hike  Stand sideways on a bottom step. Place your feet so that your left / right leg is on the step, and the other foot is hanging off the side. If you need support for balance, hold onto a railing or wall. Keep your knees straight and your abdomen square, meaning your hips are level. Then, lift your left / right hip up toward the ceiling. Slowly let your leg that's hanging off the step lower towards the floor. Your foot should get closer to the ground. Do not lean or bend your knees during this movement. Repeat __________ times. Complete this exercise __________ times a day. This information is not intended to replace advice given to you by your health care provider. Make sure you discuss any questions you have with your health care provider. Document Revised: 09/24/2022 Document Reviewed: 09/24/2022 Elsevier Patient Education  2024 [employer].

## 2023-09-19 NOTE — Telephone Encounter (Signed)
 Called and spoke with patient. She still is not sure if she wants to proceed with Reclast. She thought she was being referred to an Endocrinologist for her Osteoporosis. She will contact the office if she decides to proceed with Reclast.

## 2023-09-28 ENCOUNTER — Other Ambulatory Visit: Payer: Self-pay

## 2023-09-28 DIAGNOSIS — M359 Systemic involvement of connective tissue, unspecified: Secondary | ICD-10-CM

## 2023-09-29 NOTE — Progress Notes (Signed)
 Calcium is elevated.  Patient should cut back on calcium supplement.  Protein creatinine ratio is normal, CBC normal, sed rate and complements normal, double-stranded DNA pending.

## 2023-09-30 LAB — PROTEIN / CREATININE RATIO, URINE
Creatinine, Urine: 42 mg/dL (ref 20–275)
Protein/Creat Ratio: 119 mg/g{creat} (ref 24–184)
Protein/Creatinine Ratio: 0.119 mg/mg{creat} (ref 0.024–0.184)
Total Protein, Urine: 5 mg/dL (ref 5–24)

## 2023-09-30 LAB — CBC WITH DIFFERENTIAL/PLATELET
Absolute Lymphocytes: 2295 {cells}/uL (ref 850–3900)
Absolute Monocytes: 540 {cells}/uL (ref 200–950)
Basophils Absolute: 18 {cells}/uL (ref 0–200)
Basophils Relative: 0.4 %
Eosinophils Absolute: 81 {cells}/uL (ref 15–500)
Eosinophils Relative: 1.8 %
HCT: 38.8 % (ref 35.0–45.0)
Hemoglobin: 12.7 g/dL (ref 11.7–15.5)
MCH: 31 pg (ref 27.0–33.0)
MCHC: 32.7 g/dL (ref 32.0–36.0)
MCV: 94.6 fL (ref 80.0–100.0)
MPV: 10.7 fL (ref 7.5–12.5)
Monocytes Relative: 12 %
Neutro Abs: 1566 {cells}/uL (ref 1500–7800)
Neutrophils Relative %: 34.8 %
Platelets: 199 10*3/uL (ref 140–400)
RBC: 4.1 10*6/uL (ref 3.80–5.10)
RDW: 12.2 % (ref 11.0–15.0)
Total Lymphocyte: 51 %
WBC: 4.5 10*3/uL (ref 3.8–10.8)

## 2023-09-30 LAB — COMPLETE METABOLIC PANEL WITH GFR
AG Ratio: 1.2 (calc) (ref 1.0–2.5)
ALT: 17 U/L (ref 6–29)
AST: 33 U/L (ref 10–35)
Albumin: 3.8 g/dL (ref 3.6–5.1)
Alkaline phosphatase (APISO): 98 U/L (ref 37–153)
BUN: 23 mg/dL (ref 7–25)
CO2: 27 mmol/L (ref 20–32)
Calcium: 11 mg/dL — ABNORMAL HIGH (ref 8.6–10.4)
Chloride: 103 mmol/L (ref 98–110)
Creat: 0.85 mg/dL (ref 0.60–0.95)
Globulin: 3.1 g/dL (ref 1.9–3.7)
Glucose, Bld: 88 mg/dL (ref 65–99)
Potassium: 5 mmol/L (ref 3.5–5.3)
Sodium: 135 mmol/L (ref 135–146)
Total Bilirubin: 0.4 mg/dL (ref 0.2–1.2)
Total Protein: 6.9 g/dL (ref 6.1–8.1)
eGFR: 68 mL/min/{1.73_m2} (ref >=60)

## 2023-09-30 LAB — ANTI-DNA ANTIBODY, DOUBLE-STRANDED: ds DNA Ab: <1 [IU]/mL

## 2023-09-30 LAB — SEDIMENTATION RATE: Sed Rate: 29 mm/h (ref 0–30)

## 2023-09-30 LAB — C3 AND C4
C3 Complement: 119 mg/dL
C4 Complement: 19 mg/dL

## 2023-09-30 NOTE — Progress Notes (Signed)
 Double-stranded ENA negative.

## 2023-10-12 DIAGNOSIS — Z79899 Other long term (current) drug therapy: Secondary | ICD-10-CM | POA: Diagnosis not present

## 2023-10-12 DIAGNOSIS — H353131 Nonexudative age-related macular degeneration, bilateral, early dry stage: Secondary | ICD-10-CM | POA: Diagnosis not present

## 2023-10-12 DIAGNOSIS — H43813 Vitreous degeneration, bilateral: Secondary | ICD-10-CM | POA: Diagnosis not present

## 2023-10-12 DIAGNOSIS — H31091 Other chorioretinal scars, right eye: Secondary | ICD-10-CM | POA: Diagnosis not present

## 2023-10-12 DIAGNOSIS — H35433 Paving stone degeneration of retina, bilateral: Secondary | ICD-10-CM | POA: Diagnosis not present

## 2023-10-12 DIAGNOSIS — H35373 Puckering of macula, bilateral: Secondary | ICD-10-CM | POA: Diagnosis not present

## 2023-10-12 DIAGNOSIS — H04123 Dry eye syndrome of bilateral lacrimal glands: Secondary | ICD-10-CM | POA: Diagnosis not present

## 2023-10-31 ENCOUNTER — Other Ambulatory Visit: Payer: Self-pay

## 2023-10-31 DIAGNOSIS — M359 Systemic involvement of connective tissue, unspecified: Secondary | ICD-10-CM

## 2023-10-31 DIAGNOSIS — Z79899 Other long term (current) drug therapy: Secondary | ICD-10-CM

## 2023-10-31 DIAGNOSIS — M3501 Sicca syndrome with keratoconjunctivitis: Secondary | ICD-10-CM

## 2023-10-31 MED ORDER — HYDROXYCHLOROQUINE SULFATE 200 MG PO TABS: ORAL_TABLET | ORAL | 0 refills | Status: DC

## 2023-10-31 NOTE — Telephone Encounter (Signed)
 Patient contacted the office to request a refill of hydroxychloroquine be sent to [facility] on [address]   Last Fill: 08/08/2023  Eye exam: 04/15/2023 WNL   Labs: 09/28/2023 Calcium is elevated.  Patient should cut back on calcium supplement.  Protein creatinine ratio is normal, CBC normal, sed rate and complements normal, double-stranded DNA pending. Double-stranded ENA negative.   Next Visit: 02/09/2024  Last Visit: 09/01/2023  [id number] disease (HCC)   Current Dose per office note 09/01/2023: Plaquenil 200 mg 1 tablet by mouth BID Monday through Friday   Okay to refill Plaquenil?

## 2023-12-08 ENCOUNTER — Ambulatory Visit (HOSPITAL_COMMUNITY)
Admission: RE | Admit: 2023-12-08 | Discharge: 2023-12-08 | Disposition: A | Discharge status: Home / Self Care | Source: Ambulatory Visit | Attending: Internal Medicine | Admitting: Internal Medicine

## 2023-12-08 VITALS — BP 104/70 | HR 57 | Ht 64.5 in | Wt 124.4 lb

## 2023-12-08 DIAGNOSIS — D6869 Other thrombophilia: Secondary | ICD-10-CM | POA: Diagnosis not present

## 2023-12-08 DIAGNOSIS — Z5181 Encounter for therapeutic drug level monitoring: Secondary | ICD-10-CM

## 2023-12-08 DIAGNOSIS — I480 Paroxysmal atrial fibrillation: Secondary | ICD-10-CM | POA: Diagnosis not present

## 2023-12-08 DIAGNOSIS — Z79899 Other long term (current) drug therapy: Secondary | ICD-10-CM | POA: Diagnosis not present

## 2023-12-08 DIAGNOSIS — I4891 Unspecified atrial fibrillation: Secondary | ICD-10-CM | POA: Diagnosis not present

## 2023-12-08 NOTE — Patient Instructions (Signed)
 Follow up with Dayna Dunn in 6 months

## 2023-12-08 NOTE — Progress Notes (Signed)
 Primary Care Physician: Margarete Sharps, MD Primary Cardiologist: Dr Renna Cary Primary Electrophysiologist: Dr. Lawana Pray Referring Physician: [facility]/Dr Renna Cary   Alice Medina is a 83 y.o. female with a history of CAD with myocardial bridging, Sjogren's syndrome, HTN, HLD, and atrial fibrillation who presents for follow up in the [facility]. The patient was initially diagnosed with atrial fibrillation remotely and has been maintained on sotalol . Patient is on Eliquis  for a CHADS2VASC score of 5.   On follow up 12/08/23, she here for sotalol  surveillance. She is currently in NSR. She has had 1 episodes of Afib since last office visit with Dr. Lawana Pray when she had Covid-19 infection in March. She is on sotalol  80 mg daily. No bleeding issues on Eliquis  2.5 mg BID.    Today, she denies symptoms of chest pain, shortness of breath, orthopnea, PND, lower extremity edema, dizziness, presyncope, syncope, snoring, daytime somnolence, bleeding, or neurologic sequela. The patient is tolerating medications without difficulties and is otherwise without complaint today.    Atrial Fibrillation Risk Factors:  she does not have symptoms or diagnosis of sleep apnea. she does not have a history of rheumatic fever.   Atrial Fibrillation Management history:  Previous antiarrhythmic drugs: sotalol  Previous cardioversions: none Previous ablations: none Anticoagulation history: Eliquis    Past Medical History:  Diagnosis Date   ADENOCARCINOMA, BREAST 05/02/2009   Qualifier: Diagnosis of  By: Arvie Latus MD, Moishe Angel    Anxiety    Arthritis    Autoimmune disorder (HCC)    SJORGREN'S   Breast cancer (HCC)    right breast   Chronic headaches    CORONARY ARTERY DISEASE    cath in 1990 (normal Left main, normal CFX, normal RCA, myocardial bridge in LAD per Dr. Lindajean Res note)   Depression    Diarrhea 05/28/2013   DYSPHAGIA UNSPECIFIED 05/02/2009   Qualifier: Diagnosis of  By:  Arvie Latus MD, Moishe Angel    Family history of malignant neoplasm of gastrointestinal tract 06/22/2011   Brother with colon cancer    GERD (gastroesophageal reflux disease)    Hemorrhage of rectum and anus 06/22/2011   History of hypothyroidism 01/13/2017   History of MRSA infection 01/13/2017   Hypertension    Multiple closed fractures of metacarpal bone 07/25/2017   Myocardial bridge    Myocardial infarction [facility])    1990   Neuromuscular disorder (HCC)    LUPUS   Osteopenia of multiple sites 01/13/2017   Osteoporosis    PAC (premature atrial contraction) 04/07/2020   PAF (paroxysmal atrial fibrillation) (HCC)    Photosensitivity 01/13/2017   Primary osteoarthritis of both hands 01/13/2017   Raynaud's disease without gangrene 01/13/2017   Recurrent oral ulcers and nasal ulcers  01/13/2017   Sjogren's syndrome with keratoconjunctivitis sicca (HCC) 01/13/2017   Status post dilation of esophageal narrowing    Thyroid  disease    Ulcer     ROS- All systems are reviewed and negative except as per the HPI above.  Physical Exam: Vitals:   12/08/23 1134  BP: 104/70  Pulse: (!) 57  Weight: 56.4 kg  Height: 5' 4.5" (1.638 m)    GEN- The patient is well appearing, alert and oriented x 3 today.   Neck - no JVD or carotid bruit noted Lungs- Clear to ausculation bilaterally, normal work of breathing Heart- Regular rate and rhythm, no murmurs, rubs or gallops, PMI not laterally displaced Extremities- no clubbing, cyanosis, or edema Skin - no rash or ecchymosis noted   Wt Readings  from Last 3 Encounters:  12/08/23 56.4 kg  09/01/23 58.1 kg  06/09/23 59.6 kg    EKG today demonstrates  Vent. rate 57 BPM PR interval 256 ms QRS duration 90 ms QT/QTcB 442/430 ms P-R-T axes 88 79 83 Sinus bradycardia with 1st degree A-V block Left ventricular hypertrophy with repolarization abnormality ( Sokolow-Lyon , Romhilt-Estes ) Anteroseptal infarct (cited on or before 07-Apr-2020) Abnormal  ECG When compared with ECG of 09-Jun-2023 11:44, No significant change was found  Echo 04/14/20 demonstrated   1. Left ventricular ejection fraction, by estimation, is 65 to 70%. The  left ventricle has normal function. The left ventricle has no regional  wall motion abnormalities. There is moderate concentric left ventricular  hypertrophy. Left ventricular diastolic parameters are consistent with Grade I diastolic dysfunction (impaired relaxation).   2. Right ventricular systolic function is normal. The right ventricular  size is normal. There is normal pulmonary artery systolic pressure. The  estimated right ventricular systolic pressure is 26.0 mmHg.   3. The mitral valve is normal in structure. No evidence of mitral valve  regurgitation. No evidence of mitral stenosis.   4. The aortic valve is normal in structure. Aortic valve regurgitation is  mild. No aortic stenosis is present.   5. The inferior vena cava is normal in size with greater than 50%  respiratory variability, suggesting right atrial pressure of 3 mmHg.   Cardiac monitor 01/2023: Patch Wear Time:  13 days and 23 hours    Patient had a min HR of 46 bpm, max HR of 122 bpm, and avg HR of 60 bpm.  Predominant underlying rhythm was Sinus Rhythm.  <1% ventricular and supraventricular ectopy Patient triggered episodes associated with sinus rhythm   Will Camnitz, MD  Epic records are reviewed at length today  CHA2DS2-VASc Score = 5  The patient's score is based upon: CHF History: 0 HTN History: 1 Diabetes History: 0 Stroke History: 0 Vascular Disease History: 1 Age Score: 2 Gender Score: 1        ASSESSMENT AND PLAN: Paroxysmal Atrial Fibrillation (ICD10:  I48.0) The patient's CHA2DS2-VASc score is 5, indicating a 7.2% annual risk of stroke.   Zio monitor showed 0% afib burden, no blocks or pauses.   She is currently in NSR. She is doing well with low burden overall. Episode in March related to acute  illness.  If she has more frequent afib, may need to consider alternate AAD. Not likely a good candidate for dofetilide as she is on hydroxychloroquine . Could consider amiodarone or ablation.  Continue Eliquis  2.5 mg BID (weight < 60 kg, age > 68).  High risk medication monitoring (ICD10: U5195107) Patient requires ongoing monitoring for anti-arrhythmic medication which has the potential to cause life threatening arrhythmias or AV block. Qtc stable. Continue sotalol  80 mg daily.   Secondary Hypercoagulable State (ICD10:  D68.69) The patient is at significant risk for stroke/thromboembolism based upon her CHA2DS2-VASc Score of 5.  Continue Apixaban  (Eliquis ).  Continue Eliquis  2.5 mg BID without interruption.   HTN Stable today. Drink fluids.  CAD No chest pain.   Follow up 6 months Cardiology, 1 year Afib clinic.   Woody Heading, PA-C Afib Clinic [facility] [address] [place], [place] [address] [phone]

## 2023-12-09 ENCOUNTER — Ambulatory Visit (HOSPITAL_COMMUNITY): Payer: Self-pay | Admitting: Internal Medicine

## 2023-12-09 LAB — MAGNESIUM: Magnesium: 2.1 mg/dL (ref 1.6–2.3)

## 2023-12-22 DIAGNOSIS — I2510 Atherosclerotic heart disease of native coronary artery without angina pectoris: Secondary | ICD-10-CM | POA: Diagnosis not present

## 2023-12-22 DIAGNOSIS — M810 Age-related osteoporosis without current pathological fracture: Secondary | ICD-10-CM | POA: Diagnosis not present

## 2023-12-22 DIAGNOSIS — I1310 Hypertensive heart and chronic kidney disease without heart failure, with stage 1 through stage 4 chronic kidney disease, or unspecified chronic kidney disease: Secondary | ICD-10-CM | POA: Diagnosis not present

## 2023-12-22 DIAGNOSIS — I480 Paroxysmal atrial fibrillation: Secondary | ICD-10-CM | POA: Diagnosis not present

## 2023-12-22 DIAGNOSIS — E039 Hypothyroidism, unspecified: Secondary | ICD-10-CM | POA: Diagnosis not present

## 2023-12-22 DIAGNOSIS — F325 Major depressive disorder, single episode, in full remission: Secondary | ICD-10-CM | POA: Diagnosis not present

## 2023-12-22 DIAGNOSIS — E041 Nontoxic single thyroid nodule: Secondary | ICD-10-CM | POA: Diagnosis not present

## 2023-12-22 DIAGNOSIS — F1320 Sedative, hypnotic or anxiolytic dependence, uncomplicated: Secondary | ICD-10-CM | POA: Diagnosis not present

## 2023-12-22 DIAGNOSIS — M3500 Sicca syndrome, unspecified: Secondary | ICD-10-CM | POA: Diagnosis not present

## 2023-12-22 DIAGNOSIS — N1831 Chronic kidney disease, stage 3a: Secondary | ICD-10-CM | POA: Diagnosis not present

## 2023-12-22 DIAGNOSIS — M3210 Systemic lupus erythematosus, organ or system involvement unspecified: Secondary | ICD-10-CM | POA: Diagnosis not present

## 2023-12-22 DIAGNOSIS — D899 Disorder involving the immune mechanism, unspecified: Secondary | ICD-10-CM | POA: Diagnosis not present

## 2024-01-23 ENCOUNTER — Other Ambulatory Visit: Payer: Self-pay

## 2024-01-23 DIAGNOSIS — M359 Systemic involvement of connective tissue, unspecified: Secondary | ICD-10-CM

## 2024-01-23 DIAGNOSIS — M3501 Sicca syndrome with keratoconjunctivitis: Secondary | ICD-10-CM

## 2024-01-23 DIAGNOSIS — Z79899 Other long term (current) drug therapy: Secondary | ICD-10-CM

## 2024-01-23 MED ORDER — HYDROXYCHLOROQUINE SULFATE 200 MG PO TABS: ORAL_TABLET | ORAL | 0 refills | Status: DC

## 2024-01-23 NOTE — Telephone Encounter (Signed)
 Last Fill: 10/31/2023  Eye exam: 04/15/2023 WNL   Labs: 09/28/2023  Calcium is elevated.  Patient should cut back on calcium supplement.  Protein creatinine ratio is normal, CBC normal, sed rate and complements normal, double-stranded DNA pending.   Next Visit: 02/09/2024  Last Visit: 09/01/2023  DX: Autoimmune disease   Current Dose per office note 09/01/2023: Plaquenil  200 mg 1 tablet by mouth BID Monday through Friday   Okay to refill Plaquenil ?

## 2024-01-26 NOTE — Progress Notes (Signed)
 Office Visit Note  Patient: Alice Medina             Date of Birth: Sep 21, 1940           MRN: [id number]             PCP: Onita Rush, MD Referring: Onita Rush, MD Visit Date: 02/09/2024 Occupation: @GUAROCC @  Subjective:  Neck stiffness  History of Present Illness: Alice Medina is a 83 y.o. female with Sjogren's, Raynaud's, osteoarthritis and osteoporosis.  She returns today after her last visit in February 2025.  She has been having stiffness in her neck since May 2025 which has been gradually improving.  Twisted her left knee and had been having some discomfort in her left knee.  She continues to have dry mouth and dry eyes and fatigue.    Activities of Daily Living:  Patient reports morning stiffness for 30 minutes.   Patient Reports nocturnal pain.  Difficulty dressing/grooming: Denies Difficulty climbing stairs: Reports Difficulty getting out of chair: Reports Difficulty using hands for taps, buttons, cutlery, and/or writing: Reports  Review of Systems  Constitutional:  Positive for fatigue.  HENT:  Positive for mouth sores and mouth dryness.   Eyes:  Positive for dryness.  Respiratory:  Negative for difficulty breathing.   Cardiovascular:  Negative for chest pain and palpitations.  Gastrointestinal:  Positive for diarrhea. Negative for blood in stool and constipation.  Endocrine: Positive for increased urination.  Genitourinary:  Negative for involuntary urination.  Musculoskeletal:  Positive for joint pain, gait problem, joint pain, morning stiffness and muscle tenderness. Negative for joint swelling, myalgias, muscle weakness and myalgias.  Skin:  Positive for color change and sensitivity to sunlight. Negative for rash and hair loss.  Allergic/Immunologic: Positive for susceptible to infections.  Neurological:  Positive for dizziness and light-headedness. Negative for headaches.  Hematological:  Negative for swollen glands.  Psychiatric/Behavioral:  Positive  for sleep disturbance. Negative for depressed mood. The patient is nervous/anxious.     PMFS History:  Patient Active Problem List   Diagnosis Date Noted   Paroxysmal atrial fibrillation (HCC) 04/07/2020   Hypercoagulable state due to paroxysmal atrial fibrillation (HCC) 04/07/2020   Multiple closed fractures of metacarpal bone 07/25/2017   Autoimmune disease (HCC) +ANA +Ro +La +RF oral ulcers nasal ulcers Raynaud photosensitivity SICCA  01/13/2017   High risk medication use 01/13/2017   Photosensitivity 01/13/2017   Sjogren's syndrome with keratoconjunctivitis sicca (HCC) 01/13/2017   Recurrent oral ulcers and nasal ulcers  01/13/2017   Raynaud's disease without gangrene 01/13/2017   Primary osteoarthritis of both hands 01/13/2017   Primary osteoarthritis of both knees 01/13/2017   Primary osteoarthritis of both feet 01/13/2017   Osteoporosis 01/13/2017   History of hypertension 01/13/2017   History of hypothyroidism 01/13/2017   History of gastroesophageal reflux (GERD)/ PUD 01/13/2017   History of MRSA infection 01/13/2017   Diarrhea 05/28/2013   Family history of malignant neoplasm of gastrointestinal tract 06/22/2011   Malignant neoplasm of female breast (HCC) 05/02/2009   Coronary atherosclerosis 05/02/2009   Dysphagia 05/02/2009    Past Medical History:  Diagnosis Date   ADENOCARCINOMA, BREAST 05/02/2009   Qualifier: Diagnosis of  By: Debrah MD, Lamar BIRCH    Anxiety    Arthritis    Autoimmune disorder (HCC)    SJORGREN'S   Breast cancer (HCC)    right breast   Chronic headaches    CORONARY ARTERY DISEASE    cath in 1990 (normal Left main, normal CFX, normal RCA,  myocardial bridge in LAD per Dr. Gabe note)   Depression    Diarrhea 05/28/2013   DYSPHAGIA UNSPECIFIED 05/02/2009   Qualifier: Diagnosis of  By: Debrah MD, Lamar BIRCH    Family history of malignant neoplasm of gastrointestinal tract 06/22/2011   Brother with colon cancer    GERD (gastroesophageal  reflux disease)    Hemorrhage of rectum and anus 06/22/2011   History of hypothyroidism 01/13/2017   History of MRSA infection 01/13/2017   Hypertension    Multiple closed fractures of metacarpal bone 07/25/2017   Myocardial bridge    Myocardial infarction [facility])    1990   Neuromuscular disorder (HCC)    LUPUS   Osteopenia of multiple sites 01/13/2017   Osteoporosis    PAC (premature atrial contraction) 04/07/2020   PAF (paroxysmal atrial fibrillation) (HCC)    Photosensitivity 01/13/2017   Primary osteoarthritis of both hands 01/13/2017   Raynaud's disease without gangrene 01/13/2017   Recurrent oral ulcers and nasal ulcers  01/13/2017   Sjogren's syndrome with keratoconjunctivitis sicca (HCC) 01/13/2017   Status post dilation of esophageal narrowing    Thyroid  disease    Ulcer     Family History  Problem Relation Age of Onset   Breast cancer Mother    Lung cancer Mother    Cancer Father        larynx   Heart failure Father    Breast cancer Sister    Cancer Sister        thyroid     Heart disease Brother    Colon cancer Brother    Breast cancer Maternal Grandmother    Autoimmune disease Daughter    Cancer Son        prostate    Diabetes Son    Rheum arthritis Grandchild    Alopecia Grandchild    Pseudochol deficiency Other    Esophageal cancer Neg Hx    Rectal cancer Neg Hx    Stomach cancer Neg Hx    Past Surgical History:  Procedure Laterality Date   APPENDECTOMY     breast reconstuction     x 2   CHOLECYSTECTOMY     COLONOSCOPY     double mastectomy     PARTIAL HYSTERECTOMY  1990   TIBIA FRACTURE SURGERY Right    with subsequent hardware removal   UPPER GASTROINTESTINAL ENDOSCOPY     WRIST FRACTURE SURGERY Left    Social History   Social History Narrative   1 cup of coffee daily   Immunization History  Administered Date(s) Administered   Influenza Split 05/07/2010, 05/14/2011, 05/12/2012, 04/19/2013, 04/15/2014   Influenza, Quadrivalent,  Recombinant, Inj, Pf 04/22/2018, 04/07/2019, 05/10/2020   Influenza,inj,Quad PF,6+ Mos 04/15/2014, 05/19/2015   Influenza-Unspecified 04/24/2016, 04/23/2017   PFIZER(Purple Top)SARS-COV-2 Vaccination 09/03/2019, 09/28/2019, 06/27/2020   Pneumococcal Conjugate-13 11/20/2013, 12/24/2014   Pneumococcal Polysaccharide-23 09/08/2007, 01/06/2018   Td,absorbed, Preservative Free, Adult Use, Lf Unspecified 04/13/2011   Tdap 10/29/2009   Tetanus 04/03/2022   Unspecified SARS-COV-2 Vaccination 09/03/2019, 09/28/2019   Zoster Recombinant(Shingrix) 01/10/2018, 05/11/2018, 09/19/2018   Zoster, Live 11/20/2013, 01/13/2018, 05/11/2018     Objective: Vital Signs: BP (!) 94/58 (BP Location: Left Arm, Patient Position: Sitting, Cuff Size: Small)   Pulse 61   Resp 12   Ht 5' 4.5 (1.638 m)   Wt 121 lb 9.6 oz (55.2 kg)   BMI 20.55 kg/m    Physical Exam Vitals and nursing note reviewed.  Constitutional:      Appearance: She is well-developed.  HENT:  Head: Normocephalic and atraumatic.  Eyes:     Conjunctiva/sclera: Conjunctivae normal.  Cardiovascular:     Rate and Rhythm: Normal rate and regular rhythm.     Heart sounds: Normal heart sounds.  Pulmonary:     Effort: Pulmonary effort is normal.     Breath sounds: Normal breath sounds.  Abdominal:     General: Bowel sounds are normal.     Palpations: Abdomen is soft.  Musculoskeletal:     Cervical back: Normal range of motion.  Lymphadenopathy:     Cervical: No cervical adenopathy.  Skin:    General: Skin is warm and dry.     Capillary Refill: Capillary refill takes less than 2 seconds.  Neurological:     Mental Status: She is alert and oriented to person, place, and time.  Psychiatric:        Behavior: Behavior normal.      Musculoskeletal Exam: Patient had limited lateral rotation of the cervical spine with some discomfort.  Thoracic and lumbar spine were in good range of motion.  Shoulders, elbows, wrist joints were in good  range of motion.  She had bilateral CMC PIP and DIP thickening with no synovitis.  Hip joints in good range of motion.  She had good range of motion of bilateral knee joints without any warmth swelling or effusion.  There was no tenderness over her ankles or MTPs.  CDAI Exam: CDAI Score: -- Patient Global: --; Provider Global: -- Swollen: --; Tender: -- Joint Exam 02/09/2024   No joint exam has been documented for this visit   There is currently no information documented on the homunculus. Go to the Rheumatology activity and complete the homunculus joint exam.  Investigation: No additional findings.  Imaging: No results found.  Recent Labs: Lab Results  Component Value Date   WBC 5.0 02/02/2024   HGB 12.7 02/02/2024   PLT 183 02/02/2024   NA 129 (L) 02/02/2024   K 4.5 02/02/2024   CL 96 (L) 02/02/2024   CO2 25 02/02/2024   GLUCOSE 92 02/02/2024   BUN 20 02/02/2024   CREATININE 0.99 (H) 02/02/2024   BILITOT 0.5 02/02/2024   ALKPHOS 79 12/13/2017   AST 35 02/02/2024   ALT 19 02/02/2024   PROT 7.1 02/02/2024   ALBUMIN 3.6 12/13/2017   CALCIUM 10.8 (H) 02/02/2024   GFRAA 81 10/16/2020    Speciality Comments: PLQ Eye Exam: 04/15/2023 WNL ( Per [facility] difficult to assess due to ARMD. Patient to follow up with Dr. Jarold in 6 months.) (Per [place], obtain records from Dr. Jarold once completed.)  Procedures:  No procedures performed Allergies: Sulfa antibiotics, Codeine, Robinul [glycopyrrolate], and Sulfamethoxazole-trimethoprim   Assessment / Plan:     Visit Diagnoses: Sjogren's syndrome with keratoconjunctivitis sicca (HCC) - Positive ANA, positive SSA, left positive SSB, positive RF, oral ulcers, nasal ulcers, Raynaud's, sicca symptoms, photosensitivity, arthritis. -She continues to have dry mouth or dry eyes.  She gives history of occasional oral ulcers.  She denies any shortness of breath or lymphadenopathy.  No oral ulcers, lymphadenopathy or parotid  swelling was noted on the examination today.  Her lungs were clear to auscultation.  Over-the-counter products were discussed for dry mouth and dry eyes.  Labs obtained on February 02, 2024 urine protein creatinine ratio was normal.  Complements were normal, double-stranded DNA negative, sed rate was normal.  Labs were just discussed with the patient and her daughter.  Will recheck labs in 5 months.  Plan: Protein / creatinine  ratio, urine, Anti-DNA antibody, double-stranded, C3 and C4, Sedimentation rate, CBC with Differential/Platelet, Comprehensive metabolic panel with GFR, ANA, Sjogrens syndrome-A extractable nuclear antibody, Rheumatoid factor, Serum protein electrophoresis with reflex.  Patient is clinically doing well.  She has been on Plaquenil  for several years.  I will reduce the dose of Plaquenil  to 200 mg p.o. daily Monday to Friday.  Advised patient to contact me if she develops any new symptoms.  High risk medication use - Plaquenil  200 mg p.o. twice daily Monday to Friday.  Plaquenil  eye exam April 15, 2023.  Annual eye examination was advised.  Restasis eyedrops.  She will get repeat labs in 5 months.  Raynaud's disease without gangrene-currently not active.  Chronic right shoulder pain -she states that the shoulder pain is better she good range of motion of her shoulder joint.  Primary osteoarthritis of both hands-she had bilateral CMC, PIP and DIP thickening with no synovitis.  Trochanteric bursitis, left hip -doing better.  Primary osteoarthritis of both knees-she states she had some discomfort in the left knee joint.  No warmth swelling or effusion was noted.  Primary osteoarthritis of both feet-he had bilateral first MTP and PIP and DIP thickening.  No synovitis was noted.  History of foot fracture - Closed fracture of the left calcaneus November 16, 2021.  Age-related osteoporosis without current pathological fracture - DEXA February 01, 2023: Right total femur T-score -4.3, BMD  0.349.  On Evista.  Patient was to start Tymlos  with her endocrinologist.  She decided against it.  Vitamin D  deficiency -her calcium has been elevated.  Will check vitamin D  level with her next labs.  Plan: VITAMIN D  25 Hydroxy (Vit-D Deficiency, Fractures)  Other medical problems are listed as follows:  History of hypertension  History of atrial fibrillation  Chronic anticoagulation - On Eliquis .  History of hypothyroidism - Plan: TSH  History of gastroesophageal reflux (GERD)/ PUD  History of MRSA infection  Orders: Orders Placed This Encounter  Procedures   Protein / creatinine ratio, urine   Anti-DNA antibody, double-stranded   C3 and C4   Sedimentation rate   CBC with Differential/Platelet   Comprehensive metabolic panel with GFR   ANA   VITAMIN D  25 Hydroxy (Vit-D Deficiency, Fractures)   Sjogrens syndrome-A extractable nuclear antibody   Rheumatoid factor   Serum protein electrophoresis with reflex   TSH   No orders of the defined types were placed in this encounter.  Face-to-face time spent patient was over 40 minutes.  More than 50% time was spent in counseling and coordination of care. Follow-Up Instructions: Return in about 5 months (around 07/11/2024) for Sjogren's, Osteoarthritis.   Maya Nash, MD  Note - This record has been created using Animal nutritionist.  Chart creation errors have been sought, but may not always  have been located. Such creation errors do not reflect on  the standard of medical care.

## 2024-02-02 ENCOUNTER — Other Ambulatory Visit: Payer: Self-pay | Admitting: *Deleted

## 2024-02-02 ENCOUNTER — Telehealth: Payer: Self-pay | Admitting: Cardiology

## 2024-02-02 DIAGNOSIS — M3501 Sicca syndrome with keratoconjunctivitis: Secondary | ICD-10-CM | POA: Diagnosis not present

## 2024-02-02 DIAGNOSIS — M359 Systemic involvement of connective tissue, unspecified: Secondary | ICD-10-CM | POA: Diagnosis not present

## 2024-02-02 DIAGNOSIS — Z79899 Other long term (current) drug therapy: Secondary | ICD-10-CM | POA: Diagnosis not present

## 2024-02-02 DIAGNOSIS — I4891 Unspecified atrial fibrillation: Secondary | ICD-10-CM

## 2024-02-02 NOTE — Telephone Encounter (Signed)
*  STAT* If patient is at the pharmacy, call can be transferred to refill team.   1. Which medications need to be refilled? (please list name of each medication and dose if known)   apixaban  (ELIQUIS ) 2.5 MG TABS tablet   2. Would you like to learn more about the convenience, safety, & potential cost savings by using the [facility]?   3. Are you open to using the [facility] (Type [facility]. ).  4. Which pharmacy/location (including street and city if local pharmacy) is medication to be sent to?  [id number] [place], [place] - [address]   5. Do they need a 30 day or 90 day supply?   30 day  Patient stated she still has some medication.   Patient has appointment scheduled on 9/4 with D. Dunn.

## 2024-02-03 LAB — PROTEIN / CREATININE RATIO, URINE
Creatinine, Urine: 25 mg/dL (ref 20–275)
Protein/Creat Ratio: 160 mg/g{creat} (ref 24–184)
Protein/Creatinine Ratio: 0.16 mg/mg{creat} (ref 0.024–0.184)
Total Protein, Urine: 4 mg/dL — ABNORMAL LOW (ref 5–24)

## 2024-02-03 LAB — COMPREHENSIVE METABOLIC PANEL WITH GFR
AG Ratio: 1.3 (calc) (ref 1.0–2.5)
ALT: 19 U/L (ref 6–29)
AST: 35 U/L (ref 10–35)
Albumin: 4 g/dL (ref 3.6–5.1)
Alkaline phosphatase (APISO): 94 U/L (ref 37–153)
BUN/Creatinine Ratio: 20 (calc) (ref 6–22)
BUN: 20 mg/dL (ref 7–25)
CO2: 25 mmol/L (ref 20–32)
Calcium: 10.8 mg/dL — ABNORMAL HIGH (ref 8.6–10.4)
Chloride: 96 mmol/L — ABNORMAL LOW (ref 98–110)
Creat: 0.99 mg/dL — ABNORMAL HIGH (ref 0.60–0.95)
Globulin: 3.1 g/dL (ref 1.9–3.7)
Glucose, Bld: 92 mg/dL (ref 65–99)
Potassium: 4.5 mmol/L (ref 3.5–5.3)
Sodium: 129 mmol/L — ABNORMAL LOW (ref 135–146)
Total Bilirubin: 0.5 mg/dL (ref 0.2–1.2)
Total Protein: 7.1 g/dL (ref 6.1–8.1)
eGFR: 57 mL/min/1.73m2 — ABNORMAL LOW (ref >=60)

## 2024-02-03 LAB — CBC WITH DIFFERENTIAL/PLATELET
Absolute Lymphocytes: 2195 {cells}/uL (ref 850–3900)
Absolute Monocytes: 610 {cells}/uL (ref 200–950)
Basophils Absolute: 10 {cells}/uL (ref 0–200)
Basophils Relative: 0.2 %
Eosinophils Absolute: 30 {cells}/uL (ref 15–500)
Eosinophils Relative: 0.6 %
HCT: 38.6 % (ref 35.0–45.0)
Hemoglobin: 12.7 g/dL (ref 11.7–15.5)
MCH: 31.3 pg (ref 27.0–33.0)
MCHC: 32.9 g/dL (ref 32.0–36.0)
MCV: 95.1 fL (ref 80.0–100.0)
MPV: 10.3 fL (ref 7.5–12.5)
Monocytes Relative: 12.2 %
Neutro Abs: 2155 {cells}/uL (ref 1500–7800)
Neutrophils Relative %: 43.1 %
Platelets: 183 Thousand/uL (ref 140–400)
RBC: 4.06 Million/uL (ref 3.80–5.10)
RDW: 12.3 % (ref 11.0–15.0)
Total Lymphocyte: 43.9 %
WBC: 5 Thousand/uL (ref 3.8–10.8)

## 2024-02-03 LAB — ANTI-DNA ANTIBODY, DOUBLE-STRANDED: ds DNA Ab: <1 [IU]/mL

## 2024-02-03 LAB — C3 AND C4
C3 Complement: 123 mg/dL
C4 Complement: 22 mg/dL

## 2024-02-03 LAB — SEDIMENTATION RATE: Sed Rate: 29 mm/h (ref 0–30)

## 2024-02-03 MED ORDER — APIXABAN 2.5 MG PO TABS: 2.5000 mg | ORAL_TABLET | Freq: Two times a day (BID) | ORAL | 1 refills | Status: DC

## 2024-02-03 NOTE — Telephone Encounter (Signed)
 Prescription refill request for Eliquis  received. Indication: a fib Last office visit: 12/08/23 Scr: 0.99 epic 02/02/24 Age: 83 Weight: 56kg

## 2024-02-06 ENCOUNTER — Ambulatory Visit: Payer: Self-pay | Admitting: Rheumatology

## 2024-02-06 NOTE — Progress Notes (Signed)
 CBC normal.  Creatinine is mildly elevated and stable, sodium is low.  Calcium is elevated.  Patient should avoid calcium supplement.  Urine protein creatinine ratio normal.  Complements normal.  Double-stranded DNA negative, sed rate normal.  Labs do not indicate an autoimmune disease flare.  Please forward results to her PCP.

## 2024-02-09 ENCOUNTER — Encounter: Payer: Self-pay | Admitting: Rheumatology

## 2024-02-09 ENCOUNTER — Ambulatory Visit: Payer: PPO | Attending: Rheumatology | Admitting: Rheumatology

## 2024-02-09 ENCOUNTER — Other Ambulatory Visit: Payer: Self-pay | Admitting: *Deleted

## 2024-02-09 VITALS — BP 94/58 | HR 61 | Resp 12 | Ht 64.5 in | Wt 121.6 lb

## 2024-02-09 DIAGNOSIS — Z8614 Personal history of Methicillin resistant Staphylococcus aureus infection: Secondary | ICD-10-CM

## 2024-02-09 DIAGNOSIS — M810 Age-related osteoporosis without current pathological fracture: Secondary | ICD-10-CM

## 2024-02-09 DIAGNOSIS — Z8781 Personal history of (healed) traumatic fracture: Secondary | ICD-10-CM | POA: Diagnosis not present

## 2024-02-09 DIAGNOSIS — M359 Systemic involvement of connective tissue, unspecified: Secondary | ICD-10-CM

## 2024-02-09 DIAGNOSIS — M19071 Primary osteoarthritis, right ankle and foot: Secondary | ICD-10-CM

## 2024-02-09 DIAGNOSIS — M25511 Pain in right shoulder: Secondary | ICD-10-CM

## 2024-02-09 DIAGNOSIS — M19042 Primary osteoarthritis, left hand: Secondary | ICD-10-CM

## 2024-02-09 DIAGNOSIS — M7062 Trochanteric bursitis, left hip: Secondary | ICD-10-CM | POA: Diagnosis not present

## 2024-02-09 DIAGNOSIS — Z8679 Personal history of other diseases of the circulatory system: Secondary | ICD-10-CM

## 2024-02-09 DIAGNOSIS — Z79899 Other long term (current) drug therapy: Secondary | ICD-10-CM

## 2024-02-09 DIAGNOSIS — M170 Bilateral primary osteoarthritis of knee: Secondary | ICD-10-CM | POA: Diagnosis not present

## 2024-02-09 DIAGNOSIS — E559 Vitamin D deficiency, unspecified: Secondary | ICD-10-CM | POA: Diagnosis not present

## 2024-02-09 DIAGNOSIS — I7300 Raynaud's syndrome without gangrene: Secondary | ICD-10-CM

## 2024-02-09 DIAGNOSIS — M3501 Sicca syndrome with keratoconjunctivitis: Secondary | ICD-10-CM

## 2024-02-09 DIAGNOSIS — Z7901 Long term (current) use of anticoagulants: Secondary | ICD-10-CM

## 2024-02-09 DIAGNOSIS — Z8639 Personal history of other endocrine, nutritional and metabolic disease: Secondary | ICD-10-CM

## 2024-02-09 DIAGNOSIS — G8929 Other chronic pain: Secondary | ICD-10-CM

## 2024-02-09 DIAGNOSIS — M19041 Primary osteoarthritis, right hand: Secondary | ICD-10-CM | POA: Diagnosis not present

## 2024-02-09 DIAGNOSIS — Z8719 Personal history of other diseases of the digestive system: Secondary | ICD-10-CM

## 2024-02-09 DIAGNOSIS — M19072 Primary osteoarthritis, left ankle and foot: Secondary | ICD-10-CM

## 2024-02-09 MED ORDER — HYDROXYCHLOROQUINE SULFATE 200 MG PO TABS: ORAL_TABLET | ORAL | Status: DC

## 2024-02-09 NOTE — Telephone Encounter (Signed)
 Please review and sign no print prescription to reflect dose change.

## 2024-02-09 NOTE — Patient Instructions (Signed)
 Please decrease the dose of hydroxychloroquine  200 mg tablet, 1 tablet by mouth Monday to Friday only  Standing Labs We placed an order today for your standing lab work.   Please have your standing labs drawn in December  Please have your labs drawn 2 weeks prior to your appointment so that the provider can discuss your lab results at your appointment, if possible.  Please note that you may see your imaging and lab results in MyChart before we have reviewed them. We will contact you once all results are reviewed. Please allow our office up to 72 hours to thoroughly review all of the results before contacting the office for clarification of your results.  WALK-IN LAB HOURS  Monday through Thursday from 8:00 am -12:30 pm and 1:00 pm-4:30 pm and Friday from 8:00 am-12:00 pm.  Patients with office visits requiring labs will be seen before walk-in labs.  You may encounter longer than normal wait times. Please allow additional time. Wait times may be shorter on  Monday and Thursday afternoons.  We do not book appointments for walk-in labs. We appreciate your patience and understanding with our staff.   Labs are drawn by [facility]. Please bring your co-pay at the time of your lab draw.  You may receive a bill from [facility] for your lab work.  Please note if you are on Hydroxychloroquine  and and an order has been placed for a Hydroxychloroquine  level,  you will need to have it drawn 4 hours or more after your last dose.  If you wish to have your labs drawn at another location, please call the office 24 hours in advance so we can fax the orders.  The office is located at [address], [place], [place] [address]   If you have any questions regarding directions or hours of operation,  please call [phone].   As a reminder, please drink plenty of water prior to coming for your lab work. Thanks!   Vaccines You are taking a medication(s) that can suppress your immune system.  The  following immunizations are recommended: Flu annually Covid-19  Td/Tdap (tetanus, diphtheria, pertussis) every 10 years Pneumonia (Prevnar 15 then Pneumovax 23 at least 1 year apart.  Alternatively, can take Prevnar 20 without needing additional dose) Shingrix: 2 doses from 4 weeks to 6 months apart  Please check with your PCP to make sure you are up to date.

## 2024-02-13 DIAGNOSIS — M810 Age-related osteoporosis without current pathological fracture: Secondary | ICD-10-CM | POA: Diagnosis not present

## 2024-02-13 DIAGNOSIS — Z6820 Body mass index (BMI) 20.0-20.9, adult: Secondary | ICD-10-CM | POA: Diagnosis not present

## 2024-02-13 DIAGNOSIS — Z779 Other contact with and (suspected) exposures hazardous to health: Secondary | ICD-10-CM | POA: Diagnosis not present

## 2024-02-13 DIAGNOSIS — Z853 Personal history of malignant neoplasm of breast: Secondary | ICD-10-CM | POA: Diagnosis not present

## 2024-03-09 DIAGNOSIS — R519 Headache, unspecified: Secondary | ICD-10-CM | POA: Diagnosis not present

## 2024-03-09 DIAGNOSIS — H5310 Unspecified subjective visual disturbances: Secondary | ICD-10-CM | POA: Diagnosis not present

## 2024-03-14 DIAGNOSIS — M316 Other giant cell arteritis: Secondary | ICD-10-CM | POA: Diagnosis not present

## 2024-03-14 DIAGNOSIS — G453 Amaurosis fugax: Secondary | ICD-10-CM | POA: Diagnosis not present

## 2024-03-16 ENCOUNTER — Other Ambulatory Visit (HOSPITAL_COMMUNITY): Payer: Self-pay | Admitting: Optometry

## 2024-03-16 ENCOUNTER — Ambulatory Visit (HOSPITAL_COMMUNITY)
Admission: RE | Admit: 2024-03-16 | Discharge: 2024-03-16 | Disposition: A | Discharge status: Home / Self Care | Source: Ambulatory Visit | Attending: Vascular Surgery | Admitting: Vascular Surgery

## 2024-03-16 ENCOUNTER — Other Ambulatory Visit: Payer: Self-pay | Admitting: Physician Assistant

## 2024-03-16 DIAGNOSIS — G453 Amaurosis fugax: Secondary | ICD-10-CM | POA: Insufficient documentation

## 2024-03-16 DIAGNOSIS — M359 Systemic involvement of connective tissue, unspecified: Secondary | ICD-10-CM

## 2024-03-16 DIAGNOSIS — M3501 Sicca syndrome with keratoconjunctivitis: Secondary | ICD-10-CM

## 2024-03-16 DIAGNOSIS — Z79899 Other long term (current) drug therapy: Secondary | ICD-10-CM

## 2024-03-28 ENCOUNTER — Other Ambulatory Visit: Payer: Self-pay | Admitting: Rheumatology

## 2024-03-28 ENCOUNTER — Other Ambulatory Visit: Payer: Self-pay | Admitting: Cardiology

## 2024-03-28 DIAGNOSIS — Z79899 Other long term (current) drug therapy: Secondary | ICD-10-CM

## 2024-03-28 DIAGNOSIS — M3501 Sicca syndrome with keratoconjunctivitis: Secondary | ICD-10-CM

## 2024-03-28 DIAGNOSIS — M359 Systemic involvement of connective tissue, unspecified: Secondary | ICD-10-CM

## 2024-03-28 MED ORDER — HYDROXYCHLOROQUINE SULFATE 200 MG PO TABS: ORAL_TABLET | ORAL | 0 refills | Status: DC

## 2024-03-28 NOTE — Telephone Encounter (Signed)
 Last Fill: 01/23/2024  Eye exam: 04/15/2023 WNL    Labs: 02/02/2024 CBC normal. Creatinine is mildly elevated and stable, sodium is low. Calcium is elevated.   Next Visit: 07/11/2024  Last Visit: 02/09/2024  DX: Sjogren's syndrome with keratoconjunctivitis sicca   Current Dose per office note 02/09/2024: reduce the dose of Plaquenil  to 200 mg p.o. daily Monday to Friday.   Okay to refill Plaquenil ?

## 2024-03-28 NOTE — Progress Notes (Unsigned)
 Cardiology Office Note    Date:  03/29/2024  ID:  Alice Medina, DOB November 01, 1940, MRN [id number] PCP:  Onita Rush, MD  Cardiologist:  Oneil Parchment, MD  Electrophysiologist:  None   Chief Complaint: f/u afib, vision changes  History of Present Illness: Alice Medina    Alice Medina is a 83 y.o. female with visit-pertinent history of PAF, CAD (myocardial bridging), autoimmune disease followed by rheumatology, HTN, HLD followed by PCP, breast CA, anxiety, dilation of esophageal narrowing, hypercalcemia who presents for routine follow-up. She is a former patient of Dr. Gabe then established with Dr. Parchment. Prior notes outline history of CAD with myocardial bridging by cath in 1990 (normal Left main, normal CFX, normal RCA, myocardial bridge in LAD per Dr. Gabe note). She has been maintained on sotalol  for her atrial fibrillation and has followed with afib clinic and EP. 2d echo 03/2020 EF 65-70%, grade 1 DD, normal PA pressure. No clinical hx of CHF. Historically she has declined anticoagulation in favor of aspirin, but was agreeable to starting Eliquis  several years back since her sister was on it who did well. She also has been on spironolactone  for control of LE edema. F/u monitor 02/2023 showed NSR, <1% PVCs/PACs. Last saw AFib clinic 11/2023, plan 1 year f/u.   She returns for follow-up with her daughter doing well from cardiac standpoint without any recent CP, SOB, palpitations. She has continued mild BLE edema with varicose veins. She is not keen on compression hose. However, in August she had about a day and a half of left eye vision disturbance, difficult to describe, somewhat blurry but could still see light. Had right sided brief headache at that time as well. She saw ophthalmology whom she said everything looked OK, but ordered carotid duplex for concern for amaurosis fugax, and recommended f/u in a few weeks. Carotid duplex showed near-normal carotids with minimal plaque. The symptoms resolved  without intervention. She also reports chronic feeling of numbness and tingling in her finger tips.   Labwork independently reviewed: 01/2024 K 4.5, Cr 0.99, calcium 10.8, LFTs ok, CBC OK, Mg 2.1 11/2022 TSH OK  ROS: .    Please see the history of present illness. All other systems are reviewed and otherwise negative.  Studies Reviewed: Alice Medina    EKG:  EKG is ordered today, personally reviewed, demonstrating EKG Interpretation Date/Time:  Thursday March 29 2024 15:55:41 EDT Ventricular Rate:  59 PR Interval:  290 QRS Duration:  74 QT Interval:  392 QTC Calculation: 388 R Axis:   54  Text Interpretation: Sinus bradycardia with 1st degree A-V block with Premature atrial complexes Septal infarct (cited on or before 07-Apr-2020) Nonspecific STTW changes similar to prior Computer based artifact present Confirmed by Damani Kelemen [phone]) on 03/29/2024 4:05:01 PM    CV Studies: Cardiac studies reviewed are outlined and summarized above. Otherwise please see EMR for full report.   Current Reported Medications:.    Current Meds  Medication Sig   amLODipine  (NORVASC ) 10 MG tablet Take 1 tablet (10 mg total) by mouth daily.   apixaban  (ELIQUIS ) 2.5 MG TABS tablet Take 1 tablet (2.5 mg total) by mouth 2 (two) times daily.   Budesonide-Formoterol Fumarate (SYMBICORT IN) Inhale into the lungs as needed.    Cholecalciferol (VITAMIN D3) 50 MCG (2000 UT) capsule Take 2,000 Units by mouth daily.   clobetasol cream (TEMOVATE) 0.05 % Apply topically 2 (two) times daily as needed.   cyclobenzaprine (FLEXERIL) 10 MG tablet Take 10 mg by mouth at  bedtime as needed for muscle spasms.   cycloSPORINE (RESTASIS) 0.05 % ophthalmic emulsion Place 1 drop into both eyes 2 (two) times daily.   diclofenac  Sodium (VOLTAREN ) 1 % GEL Apply 2-4 grams to affected joint 4 times daily as needed.   DULoxetine (CYMBALTA) 60 MG capsule Take 60 mg by mouth daily.   hydroxychloroquine  (PLAQUENIL ) 200 MG tablet TAKE 1 TABLET  BY MOUTH DAILY ON MONDAY THROUGH FRIDAY ONLY.   levothyroxine (SYNTHROID, LEVOTHROID) 50 MCG tablet Take 50 mcg by mouth daily.   LORazepam (ATIVAN) 2 MG tablet Take 2 mg by mouth at bedtime.   losartan  (COZAAR ) 100 MG tablet TAKE 1 TABLET BY MOUTH EVERY DAY   magnesium  oxide (MAG-OX) 400 MG tablet Take 1 tablet (400 mg total) by mouth 2 (two) times daily.   Multiple Vitamins-Minerals (MULTIVITAMIN WITH MINERALS) tablet Take 1 tablet by mouth daily.   omeprazole  (PRILOSEC) 20 MG capsule Take 20 mg by mouth as needed.   raloxifene (EVISTA) 60 MG tablet Take 60 mg by mouth at bedtime.   sotalol  (BETAPACE ) 80 MG tablet Take 1 tablet by mouth daily   spironolactone  (ALDACTONE ) 25 MG tablet TAKE ONE TABLET BY MOUTH DAILY   triamcinolone  cream (KENALOG ) 0.1 % Apply topically daily as needed.    Physical Exam:    VS:  BP 124/70   Pulse (!) 59   Ht 5' 4.5 (1.638 m)   Wt 121 lb 12.8 oz (55.2 kg)   SpO2 96%   BMI 20.58 kg/m    Wt Readings from Last 3 Encounters:  03/29/24 121 lb 12.8 oz (55.2 kg)  02/09/24 121 lb 9.6 oz (55.2 kg)  12/08/23 124 lb 6.4 oz (56.4 kg)    GEN: Well nourished, well developed in no acute distress NECK: No JVD; No carotid bruits CARDIAC: RRR, no murmurs, rubs, gallops RESPIRATORY:  Clear to auscultation without rales, wheezing or rhonchi  ABDOMEN: Soft, non-tender, non-distended EXTREMITIES:  No edema; No acute deformity   Asessement and Plan:.    1. Amaurosis fugax - no other focal neurologic symptoms. Vision changes were somewhat vague, not clearly curtain-like in etiology. She is already anticoagulated with Eliquis  and denies any missed doses.  Carotid duplex was reassuring. Will update echocardiogram, order brain MRI and refer to neurology. ? Whether this represented complex migraine given the contralateral headache at the time. ER precautions reviewed. Also encouraged f/u early PCP to review this episode as she also has chronic hypercalcemia and parasthesias.  She will discuss her vit D supplement with PCP as well. Get BMET, CBC today.  2. CAD by way of myocardial bridge - not on ASA given age and concomitant Eliquis . No recent progressive symptoms. Lipids are managed by PCP.  3. Essential HTN - controlled, no changes made today  4. Paroxysmal atrial fibrillation, first degree AVB - continues on renally adjusted sotalol  dosing once daily per Afib clinic. Eliquis  dose 2.5mg  BID appropriate for age/weight. Afib clinic plans to see her back 11/2024. We will need to keep this in mind for timing of gen cards f/u.  5. Lower extremity edema - appears similar to prior assessment. Given recent issues and subsequent stability, would not make major medication changes acutely. Continue spironolactone  25mg  daily. Checking BMET today.    Disposition: F/u with me in 3 months.  Signed, Berklee Battey N Luka Reisch, PA-C

## 2024-03-28 NOTE — Telephone Encounter (Signed)
 Patient contacted the office to request a medication refill.   1. Name of Medication: Plaquenil   2. How are you currently taking this medication (dosage and times per day)? Pt lvm   3. What pharmacy would you like for that to be sent to? [facility] [address]

## 2024-03-29 ENCOUNTER — Encounter: Payer: Self-pay | Admitting: Physician Assistant

## 2024-03-29 ENCOUNTER — Ambulatory Visit: Attending: Physician Assistant | Admitting: Physician Assistant

## 2024-03-29 VITALS — BP 124/70 | HR 59 | Ht 64.5 in | Wt 121.8 lb

## 2024-03-29 DIAGNOSIS — I2510 Atherosclerotic heart disease of native coronary artery without angina pectoris: Secondary | ICD-10-CM

## 2024-03-29 DIAGNOSIS — R001 Bradycardia, unspecified: Secondary | ICD-10-CM

## 2024-03-29 DIAGNOSIS — G453 Amaurosis fugax: Secondary | ICD-10-CM | POA: Diagnosis not present

## 2024-03-29 DIAGNOSIS — I10 Essential (primary) hypertension: Secondary | ICD-10-CM | POA: Diagnosis not present

## 2024-03-29 DIAGNOSIS — I480 Paroxysmal atrial fibrillation: Secondary | ICD-10-CM | POA: Diagnosis not present

## 2024-03-29 DIAGNOSIS — R600 Localized edema: Secondary | ICD-10-CM | POA: Diagnosis not present

## 2024-03-29 LAB — BASIC METABOLIC PANEL WITH GFR
BUN/Creatinine Ratio: 24 (ref 12–28)
BUN: 25 mg/dL (ref 8–27)
CO2: 22 mmol/L (ref 20–29)
Calcium: 11.5 mg/dL — ABNORMAL HIGH (ref 8.7–10.3)
Chloride: 96 mmol/L (ref 96–106)
Creatinine, Ser: 1.05 mg/dL — ABNORMAL HIGH (ref 0.57–1.00)
Glucose: 97 mg/dL (ref 70–99)
Potassium: 5.3 mmol/L — ABNORMAL HIGH (ref 3.5–5.2)
Sodium: 130 mmol/L — ABNORMAL LOW (ref 134–144)
eGFR: 53 mL/min/1.73 — ABNORMAL LOW (ref >59)

## 2024-03-29 NOTE — Addendum Note (Signed)
 Addended by: MEMORY DELON POUR on: 03/29/2024 04:42 PM   Modules accepted: Orders

## 2024-03-29 NOTE — Patient Instructions (Addendum)
 Medication Instructions:  Your physician recommends that you continue on your current medications as directed. Please refer to the Current Medication list given to you today.  *If you need a refill on your cardiac medications before your next appointment, please call your pharmacy*  Lab Work: TODAY:  BMET  If you have labs (blood work) drawn today and your tests are completely normal, you will receive your results only by: MyChart Message (if you have MyChart) OR A paper copy in the mail If you have any lab test that is abnormal or we need to change your treatment, we will call you to review the results.  Testing/Procedures: Your physician recommends you have a MRI Without Contrast of the Brain.   Your physician has requested that you have an echocardiogram. Echocardiography is a painless test that uses sound waves to create images of your heart. It provides your doctor with information about the size and shape of your heart and how well your heart's chambers and valves are working. This procedure takes approximately one hour. There are no restrictions for this procedure. Please do NOT wear cologne, perfume, aftershave, or lotions (deodorant is allowed). Please arrive 15 minutes prior to your appointment time.  Please note: We ask at that you not bring children with you during ultrasound (echo/ vascular) testing. Due to room size and safety concerns, children are not allowed in the ultrasound rooms during exams. Our front office staff cannot provide observation of children in our lobby area while testing is being conducted. An adult accompanying a patient to their appointment will only be allowed in the ultrasound room at the discretion of the ultrasound technician under special circumstances. We apologize for any inconvenience.    You have been referred to NEUROLOGY.  THEY WILL REACH OUT TO YOU FOR AN APPOINTMENT.   Follow-Up: At [facility], you and your health needs are our  priority.  As part of our continuing mission to provide you with exceptional heart care, our providers are all part of one team.  This team includes your primary Cardiologist (physician) and Advanced Practice Providers or APPs (Physician Assistants and Nurse Practitioners) who all work together to provide you with the care you need, when you need it.  Your next appointment:   3 month(s)  Provider:   Oneil Parchment, MD or Dayna Dunn, PA-C          We recommend signing up for the patient portal called MyChart.  Sign up information is provided on this After Visit Summary.  MyChart is used to connect with patients for Virtual Visits (Telemedicine).  Patients are able to view lab/test results, encounter notes, upcoming appointments, etc.  Non-urgent messages can be sent to your provider as well.   To learn more about what you can do with MyChart, go to [email or web address].   Other Instructions

## 2024-03-30 ENCOUNTER — Other Ambulatory Visit: Payer: Self-pay | Admitting: *Deleted

## 2024-03-30 ENCOUNTER — Ambulatory Visit: Payer: Self-pay | Admitting: Physician Assistant

## 2024-03-30 ENCOUNTER — Encounter: Payer: Self-pay | Admitting: Neurology

## 2024-03-30 DIAGNOSIS — Z79899 Other long term (current) drug therapy: Secondary | ICD-10-CM

## 2024-04-05 NOTE — Progress Notes (Signed)
 Assessment/Plan:   1.  Vision change  - Overall, the semiology does not really sound like amaurosis fugax.  Her symptoms lasted for a few days with sx's coming and going and involved both eyes, the the L being much worse than the R.  I wonder if this was more complicated migraine.  She did have a slight headache with it.  Nonetheless, even if this was some sort of ischemic event, the patient is already on apixaban  for atrial fibrillation.  She had a carotid ultrasound that was unremarkable.  She has had a echocardiogram ordered and that is scheduled for October 20.  She already has an MRI of the brain scheduled by her primary PA.  We did discuss that in her age with her medical problems, we are most certainly going to see small vessel disease and likely atrophy.  This will not change any management, unless we see something acute.  - Patient reports that her optometrist did check sedimentation rate.  MRI with suspicion for TA is very low.  She currently has no symptoms.  Her superficial temporal arteries are without ropiness or tenderness.  -We did talk about stroke signs/symptoms to be aware of in the future and to call 911 should she experience any of these. Subjective:   Alice Medina was seen today in neurologic consultation at the request of Abigail Raphael SAILOR, PA-C.  The consultation is for the evaluation of amaurosis fugax. Pt with sister who supplements hx.   Patient is sent by the cardiology PA.  Patient is an 83 year old female with a history of Sjogren syndrome with keratoconjunctivitis sicca, hypertension, hypothyroidism, reflux, breast cancer who had an episode back in August of vision change for about a day and a half.  She was sent because of vision trouble out of the left eye.  She could see light, but her vision was blurry.  The right eye was blurry was blurry as well but not like the left.   If she closed the left eye, she could see much better but was still blurry.  If she closed the  right eye, she was only seeing light and she could barely see.    She does not describe this is a shade coming down over the eye, and it lasted for a day and a half.  She did have a dull headache and a sharp pain in the right temple.  Headaches are unusual for her, although she does have a history of chronic headache per the chart.  The vision change lasted all day the first day (started in the afternoon) and she went to bed that day and it was still like that.  It was better in the AM the next day but started again around 11am and again lasted until bedtime.  The following day, she was better but lost vision when she bent over at the grocery store but it was normal when she got back up.  She cannot say if the vision was black this time because it was so fast.   She did see her optometrist at [facility], Dr. Robinson, on 8/15 and was told that everything looked good.  They did order a carotid ultrasound and that was done on August 22 and it was unremarkable.  She states that she had labs for TA and she states that they called her and told her it was normal.  She has an appt with Dr. Patrcia, her ophthalmologist, on 9/23 as she has had blurry vision for  a few months.  She denies any other lateralizing weakness or paresthesias.  She is already on apixaban  for paroxysmal atrial fibrillation.  Cardiology PA ordered an echocardiogram and that is scheduled for October 20.  She has no other lateralizing weakness or paresthesias.  She does have a stiff neck x a fews.  No recent falls.  She has occ intermittent diplopia when she is tired and she can turn her head or blink it away.      ALLERGIES:   Allergies  Allergen Reactions   Sulfa Antibiotics Other (See Comments)   Codeine Other (See Comments)    Made stomach spasm   Glycopyrrolate Other (See Comments) and Nausea Only    AFIB, made heart race and BP go up  Robinul   Sulfamethoxazole-Trimethoprim Other (See Comments)    Peripheral neuropathy  in hands    CURRENT MEDICATIONS:  Outpatient Encounter Medications as of 04/09/2024  Medication Sig   acetaminophen (TYLENOL) 500 MG tablet Take 500 mg by mouth every 6 (six) hours as needed for mild pain (pain score 1-3).   amLODipine  (NORVASC ) 10 MG tablet Take 1 tablet (10 mg total) by mouth daily.   apixaban  (ELIQUIS ) 2.5 MG TABS tablet Take 1 tablet (2.5 mg total) by mouth 2 (two) times daily.   Budesonide-Formoterol Fumarate (SYMBICORT IN) Inhale into the lungs as needed.    Cholecalciferol (VITAMIN D3) 50 MCG (2000 UT) capsule Take 2,000 Units by mouth daily.   clobetasol cream (TEMOVATE) 0.05 % Apply topically 2 (two) times daily as needed.   cyclobenzaprine (FLEXERIL) 10 MG tablet Take 10 mg by mouth at bedtime as needed for muscle spasms.   cycloSPORINE (RESTASIS) 0.05 % ophthalmic emulsion Place 1 drop into both eyes 2 (two) times daily.   diclofenac  Sodium (VOLTAREN ) 1 % GEL Apply 2-4 grams to affected joint 4 times daily as needed.   DULoxetine (CYMBALTA) 60 MG capsule Take 60 mg by mouth daily.   hydroxychloroquine  (PLAQUENIL ) 200 MG tablet TAKE 1 TABLET BY MOUTH DAILY ON MONDAY THROUGH FRIDAY ONLY.   levothyroxine (SYNTHROID, LEVOTHROID) 50 MCG tablet Take 50 mcg by mouth daily.   LORazepam (ATIVAN) 2 MG tablet Take 2 mg by mouth at bedtime.   losartan  (COZAAR ) 100 MG tablet TAKE 1 TABLET BY MOUTH EVERY DAY   magnesium  oxide (MAG-OX) 400 MG tablet Take 1 tablet (400 mg total) by mouth 2 (two) times daily.   Multiple Vitamins-Minerals (MULTIVITAMIN WITH MINERALS) tablet Take 1 tablet by mouth daily.   Multiple Vitamins-Minerals (PRESERVISION AREDS 2 PO) Take 1 capsule by mouth in the morning and at bedtime.   omeprazole  (PRILOSEC) 20 MG capsule Take 20 mg by mouth as needed.   Polyethyl Glycol-Propyl Glycol (SYSTANE ULTRA PF OP) Apply 1 drop to eye as needed (in each eye).   raloxifene (EVISTA) 60 MG tablet Take 60 mg by mouth at bedtime.   sotalol  (BETAPACE ) 80 MG tablet Take  1 tablet by mouth daily   spironolactone  (ALDACTONE ) 25 MG tablet Take 0.5 tablets (12.5 mg total) by mouth daily.   triamcinolone  cream (KENALOG ) 0.1 % Apply topically daily as needed.   [DISCONTINUED] spironolactone  (ALDACTONE ) 25 MG tablet TAKE ONE TABLET BY MOUTH DAILY   No facility-administered encounter medications on file as of 04/09/2024.    Objective:   PHYSICAL EXAMINATION:    VITALS:   Vitals:   04/09/24 1246  BP: 120/70  Pulse: 71  SpO2: 99%  Weight: 123 lb (55.8 kg)  Height: 5' 4.5 (1.638 m)  GEN:  Normal appears female in no acute distress.  Appears stated age. HEENT:  Normocephalic, atraumatic. The mucous membranes are moist. The superficial temporal arteries are without ropiness or tenderness.  No superficial temporal artery bruits. Cardiovascular: Regular rate and rhythm. Lungs: Clear to auscultation bilaterally. Neck/Heme: There are no carotid bruits noted bilaterally.  NEUROLOGICAL: Orientation:  The patient is alert and oriented x 3.   Cranial nerves: There is good facial symmetry.  Extraocular muscles are intact and visual fields are full to confrontational testing. Speech is fluent and clear. Soft palate rises symmetrically and there is no tongue deviation. Hearing is intact to conversational tone. Tone: Tone is good throughout. Sensation: Sensation is intact to light touch and pinprick throughout (facial, trunk, extremities). Vibration is absent at the bilateral big toe but intact at the ankle. There is no extinction with double simultaneous stimulation. There is no sensory dermatomal level identified. Coordination:  The patient has no difficulty with RAM's or FNF bilaterally. Motor: Strength is 5/5 in the bilateral upper and lower extremities.  Shoulder shrug is equal and symmetric. There is no pronator drift.  There are no fasciculations noted. DTR's: Deep tendon reflexes are 2/4 at the bilateral biceps, triceps, brachioradialis, patella and absent at the  bilateral achilles.  Plantar responses are downgoing bilaterally. Gait and Station: The patient is able to ambulate without difficulty.     Total time spent on today's visit was 60 minutes, including both face-to-face time and nonface-to-face time.  Time included that spent on review of records (prior notes available to me/labs/imaging if pertinent), discussing treatment and goals, answering patient's questions and coordinating care.   Cc:  Onita Rush, MD

## 2024-04-06 ENCOUNTER — Ambulatory Visit: Payer: Self-pay | Admitting: Physician Assistant

## 2024-04-06 LAB — CBC
Hematocrit: 35 % (ref 34.0–46.6)
Hemoglobin: 11.6 g/dL (ref 11.1–15.9)
MCH: 31.9 pg (ref 26.6–33.0)
MCHC: 33.1 g/dL (ref 31.5–35.7)
MCV: 96 fL (ref 79–97)
Platelets: 165 x10E3/uL (ref 150–450)
RBC: 3.64 x10E6/uL — ABNORMAL LOW (ref 3.77–5.28)
RDW: 11.9 % (ref 11.7–15.4)
WBC: 5.5 x10E3/uL (ref 3.4–10.8)

## 2024-04-06 LAB — BASIC METABOLIC PANEL WITH GFR
BUN/Creatinine Ratio: 23 (ref 12–28)
BUN: 21 mg/dL (ref 8–27)
CO2: 22 mmol/L (ref 20–29)
Calcium: 10.3 mg/dL (ref 8.7–10.3)
Chloride: 101 mmol/L (ref 96–106)
Creatinine, Ser: 0.91 mg/dL (ref 0.57–1.00)
Glucose: 78 mg/dL (ref 70–99)
Potassium: 4.5 mmol/L (ref 3.5–5.2)
Sodium: 134 mmol/L (ref 134–144)
eGFR: 63 mL/min/1.73 (ref >59)

## 2024-04-06 MED ORDER — SPIRONOLACTONE 25 MG PO TABS: 12.5000 mg | ORAL_TABLET | Freq: Every day | ORAL | Status: DC

## 2024-04-09 ENCOUNTER — Encounter: Payer: Self-pay | Admitting: Neurology

## 2024-04-09 ENCOUNTER — Ambulatory Visit: Admitting: Neurology

## 2024-04-09 VITALS — BP 120/70 | HR 71 | Ht 64.5 in | Wt 123.0 lb

## 2024-04-09 DIAGNOSIS — H539 Unspecified visual disturbance: Secondary | ICD-10-CM | POA: Diagnosis not present

## 2024-04-09 DIAGNOSIS — D6869 Other thrombophilia: Secondary | ICD-10-CM | POA: Diagnosis not present

## 2024-04-09 DIAGNOSIS — I480 Paroxysmal atrial fibrillation: Secondary | ICD-10-CM

## 2024-04-09 NOTE — Patient Instructions (Signed)
 Good to see you today!  We would like to get a copy of your labs from [facility].  The physicians and staff at [facility] are committed to providing excellent care. You may receive a survey requesting feedback about your experience at our office. We strive to receive very good responses to the survey questions. If you feel that your experience would prevent you from giving the office a very good  response, please contact our office to try to remedy the situation. We may be reached at [phone]. Thank you for taking the time out of your busy day to complete the survey.

## 2024-04-13 ENCOUNTER — Ambulatory Visit (HOSPITAL_BASED_OUTPATIENT_CLINIC_OR_DEPARTMENT_OTHER)
Admission: RE | Admit: 2024-04-13 | Discharge: 2024-04-13 | Disposition: A | Discharge status: Home / Self Care | Source: Ambulatory Visit | Attending: Physician Assistant | Admitting: Physician Assistant

## 2024-04-13 DIAGNOSIS — I6782 Cerebral ischemia: Secondary | ICD-10-CM | POA: Diagnosis not present

## 2024-04-13 DIAGNOSIS — I2510 Atherosclerotic heart disease of native coronary artery without angina pectoris: Secondary | ICD-10-CM | POA: Diagnosis not present

## 2024-04-13 DIAGNOSIS — G453 Amaurosis fugax: Secondary | ICD-10-CM | POA: Insufficient documentation

## 2024-04-13 DIAGNOSIS — I10 Essential (primary) hypertension: Secondary | ICD-10-CM | POA: Insufficient documentation

## 2024-04-13 DIAGNOSIS — I480 Paroxysmal atrial fibrillation: Secondary | ICD-10-CM | POA: Diagnosis not present

## 2024-04-13 DIAGNOSIS — R600 Localized edema: Secondary | ICD-10-CM | POA: Insufficient documentation

## 2024-04-17 DIAGNOSIS — H04123 Dry eye syndrome of bilateral lacrimal glands: Secondary | ICD-10-CM | POA: Diagnosis not present

## 2024-04-17 DIAGNOSIS — H524 Presbyopia: Secondary | ICD-10-CM | POA: Diagnosis not present

## 2024-04-17 DIAGNOSIS — H26491 Other secondary cataract, right eye: Secondary | ICD-10-CM | POA: Diagnosis not present

## 2024-04-17 DIAGNOSIS — H35373 Puckering of macula, bilateral: Secondary | ICD-10-CM | POA: Diagnosis not present

## 2024-04-17 DIAGNOSIS — H25012 Cortical age-related cataract, left eye: Secondary | ICD-10-CM | POA: Diagnosis not present

## 2024-04-17 DIAGNOSIS — H2512 Age-related nuclear cataract, left eye: Secondary | ICD-10-CM | POA: Diagnosis not present

## 2024-04-17 DIAGNOSIS — H353132 Nonexudative age-related macular degeneration, bilateral, intermediate dry stage: Secondary | ICD-10-CM | POA: Diagnosis not present

## 2024-04-17 DIAGNOSIS — Z79899 Other long term (current) drug therapy: Secondary | ICD-10-CM | POA: Diagnosis not present

## 2024-04-17 LAB — OPHTHALMOLOGY REPORT-SCANNED

## 2024-05-09 ENCOUNTER — Telehealth: Payer: Self-pay | Admitting: Cardiology

## 2024-05-09 MED ORDER — SPIRONOLACTONE 25 MG PO TABS: 12.5000 mg | ORAL_TABLET | Freq: Every day | ORAL | 3 refills | Status: AC

## 2024-05-09 NOTE — Telephone Encounter (Signed)
*  STAT* If patient is at the pharmacy, call can be transferred to refill team.   1. Which medications need to be refilled? (please list name of each medication and dose if known)   spironolactone  (ALDACTONE ) 25 MG tablet    2. Which pharmacy/location (including street and city if local pharmacy) is medication to be sent to?  [id number] [address], [place] - [address]     3. Do they need a 30 day or 90 day supply? 90 day

## 2024-05-09 NOTE — Telephone Encounter (Signed)
 Pt's medication was sent to pt's pharmacy as requested. Confirmation received.

## 2024-05-14 ENCOUNTER — Ambulatory Visit (HOSPITAL_COMMUNITY)
Admission: RE | Admit: 2024-05-14 | Discharge: 2024-05-14 | Disposition: A | Discharge status: Home / Self Care | Source: Ambulatory Visit | Attending: Cardiology | Admitting: Cardiology

## 2024-05-14 DIAGNOSIS — G453 Amaurosis fugax: Secondary | ICD-10-CM | POA: Diagnosis not present

## 2024-05-14 DIAGNOSIS — I2510 Atherosclerotic heart disease of native coronary artery without angina pectoris: Secondary | ICD-10-CM | POA: Diagnosis not present

## 2024-05-14 DIAGNOSIS — I10 Essential (primary) hypertension: Secondary | ICD-10-CM | POA: Insufficient documentation

## 2024-05-14 DIAGNOSIS — R600 Localized edema: Secondary | ICD-10-CM | POA: Diagnosis not present

## 2024-05-14 DIAGNOSIS — I480 Paroxysmal atrial fibrillation: Secondary | ICD-10-CM | POA: Insufficient documentation

## 2024-05-14 LAB — ECHOCARDIOGRAM COMPLETE
Area-P 1/2: 3.56 cm2
P 1/2 time: 513 ms
S' Lateral: 1.9 cm

## 2024-06-07 MED ORDER — AMLODIPINE BESYLATE 10 MG PO TABS: 10.0000 mg | ORAL_TABLET | Freq: Every day | ORAL | 3 refills | Status: AC

## 2024-06-17 ENCOUNTER — Other Ambulatory Visit: Payer: Self-pay | Admitting: Physician Assistant

## 2024-06-17 DIAGNOSIS — M359 Systemic involvement of connective tissue, unspecified: Secondary | ICD-10-CM

## 2024-06-17 DIAGNOSIS — Z79899 Other long term (current) drug therapy: Secondary | ICD-10-CM

## 2024-06-17 DIAGNOSIS — M3501 Sicca syndrome with keratoconjunctivitis: Secondary | ICD-10-CM

## 2024-06-18 NOTE — Telephone Encounter (Signed)
 Last Fill: 03/28/2024  Eye exam: 04/15/2023 WNL   Labs: 04/05/2024 RBC 3.64  Next Visit: 07/11/2024  Last Visit: 02/09/2024  DX: Sjogren's syndrome with keratoconjunctivitis sicca   Current Dose per office note 02/09/2024: reduce the dose of Plaquenil  to 200 mg p.o. daily Monday to Friday.   Spoke with patient and advised we need her updated eye exam. Patient states she updated it in September and will call the eye doctor and have them fax the results.   Okay to refill Plaquenil ?

## 2024-06-19 ENCOUNTER — Other Ambulatory Visit: Payer: Self-pay | Admitting: *Deleted

## 2024-06-19 DIAGNOSIS — M3501 Sicca syndrome with keratoconjunctivitis: Secondary | ICD-10-CM

## 2024-06-19 DIAGNOSIS — E559 Vitamin D deficiency, unspecified: Secondary | ICD-10-CM

## 2024-06-19 DIAGNOSIS — Z8639 Personal history of other endocrine, nutritional and metabolic disease: Secondary | ICD-10-CM

## 2024-06-27 NOTE — Progress Notes (Unsigned)
 Office Visit Note  Patient: Alice Medina             Date of Birth: Aug 20, 1940           MRN: [id number]             PCP: Onita Rush, MD Referring: Onita Rush, MD Visit Date: 07/11/2024 Occupation: Data Unavailable  Subjective:  Sicca symptoms   History of Present Illness: Alice Medina is a 83 y.o. female with a past medical history of sjogren's syndrome and osteoarthritis.  Patient remains on - Plaquenil  200 mg p.o. once daily Monday to Friday.  She is tolerating Plaquenil  without any side effects.  She has not noticed any new or worsening symptoms on the reduced dose of Plaquenil .  She continues to experience intermittent arthralgias and joint stiffness.  Patient tries to remain active when she can to help alleviate joint stiffness.  She experiences intermittent symptoms of Raynaud's phenomenon but denies any digital ulcerations.  Patient continues to have chronic sicca symptoms.  She has been using eyedrops throughout the day for symptomatic relief as well as over-the-counter products for dry mouth.  She denies any oral ulcers but has 1 nasal ulcer. She was recently recovering from the COVID-19 infection.  Patient continues to experience fatigue.     Activities of Daily Living:  Patient reports morning stiffness for 30 minutes.   Patient Reports nocturnal pain.  Difficulty dressing/grooming: Denies Difficulty climbing stairs: Reports Difficulty getting out of chair: Reports Difficulty using hands for taps, buttons, cutlery, and/or writing: Reports  Review of Systems  Constitutional:  Positive for fatigue.  HENT:  Positive for mouth dryness. Negative for mouth sores.   Eyes:  Positive for dryness.  Respiratory:  Positive for shortness of breath.   Cardiovascular:  Positive for palpitations. Negative for chest pain.  Gastrointestinal:  Negative for blood in stool, constipation and diarrhea.  Endocrine: Negative for increased urination.  Genitourinary:  Positive for  involuntary urination.  Musculoskeletal:  Positive for joint pain, gait problem, joint pain, joint swelling, myalgias, morning stiffness and myalgias. Negative for muscle weakness and muscle tenderness.  Skin:  Positive for rash and sensitivity to sunlight. Negative for color change and hair loss.  Allergic/Immunologic: Negative for susceptible to infections.  Neurological:  Positive for dizziness. Negative for headaches.  Hematological:  Negative for swollen glands.  Psychiatric/Behavioral:  Positive for depressed mood and sleep disturbance. The patient is nervous/anxious.     PMFS History:  Patient Active Problem List   Diagnosis Date Noted   Paroxysmal atrial fibrillation (HCC) 04/07/2020   Hypercoagulable state due to paroxysmal atrial fibrillation (HCC) 04/07/2020   Multiple closed fractures of metacarpal bone 07/25/2017   Autoimmune disease (HCC) +ANA +Ro +La +RF oral ulcers nasal ulcers Raynaud photosensitivity [facility]  01/13/2017   High risk medication use 01/13/2017   Photosensitivity 01/13/2017   Sjogren's syndrome with keratoconjunctivitis sicca 01/13/2017   Recurrent oral ulcers and nasal ulcers  01/13/2017   Raynaud's disease without gangrene 01/13/2017   Primary osteoarthritis of both hands 01/13/2017   Primary osteoarthritis of both knees 01/13/2017   Primary osteoarthritis of both feet 01/13/2017   Osteoporosis 01/13/2017   History of hypertension 01/13/2017   History of hypothyroidism 01/13/2017   History of gastroesophageal reflux (GERD)/ PUD 01/13/2017   History of MRSA infection 01/13/2017   Diarrhea 05/28/2013   Family history of malignant neoplasm of gastrointestinal tract 06/22/2011   Malignant neoplasm of female breast (HCC) 05/02/2009   Coronary atherosclerosis 05/02/2009  Dysphagia 05/02/2009    Past Medical History:  Diagnosis Date   ADENOCARCINOMA, BREAST 05/02/2009   Qualifier: Diagnosis of  By: Debrah MD, Lamar BIRCH    Anxiety    Arthritis     Autoimmune disorder    SJORGREN'S   Breast cancer [facility])    right breast   Chronic headaches    CORONARY ARTERY DISEASE    cath in 1990 (normal Left main, normal CFX, normal RCA, myocardial bridge in LAD per Dr. Gabe note)   Depression    Diarrhea 05/28/2013   DYSPHAGIA UNSPECIFIED 05/02/2009   Qualifier: Diagnosis of  By: Debrah MD, Lamar BIRCH    Family history of malignant neoplasm of gastrointestinal tract 06/22/2011   Brother with colon cancer    GERD (gastroesophageal reflux disease)    Hemorrhage of rectum and anus 06/22/2011   History of hypothyroidism 01/13/2017   History of MRSA infection 01/13/2017   Hypertension    Multiple closed fractures of metacarpal bone 07/25/2017   Myocardial bridge    Myocardial infarction (HCC)    1990   Neuromuscular disorder (HCC)    LUPUS   Osteopenia of multiple sites 01/13/2017   Osteoporosis    PAC (premature atrial contraction) 04/07/2020   PAF (paroxysmal atrial fibrillation) (HCC)    Photosensitivity 01/13/2017   Primary osteoarthritis of both hands 01/13/2017   Raynaud's disease without gangrene 01/13/2017   Recurrent oral ulcers and nasal ulcers  01/13/2017   Sjogren's syndrome with keratoconjunctivitis sicca 01/13/2017   Status post dilation of esophageal narrowing    Thyroid  disease    Ulcer     Family History  Problem Relation Age of Onset   Breast cancer Mother    Lung cancer Mother    Cancer Father        larynx   Heart failure Father    Breast cancer Sister    Cancer Sister        thyroid     Atrial fibrillation Sister    Heart disease Brother    Colon cancer Brother    Breast cancer Maternal Grandmother    Autoimmune disease Daughter    Cancer Son        prostate    Diabetes Son    Rheum arthritis Grandchild    Alopecia Grandchild    Pseudochol deficiency Other    Esophageal cancer Neg Hx    Rectal cancer Neg Hx    Stomach cancer Neg Hx    Past Surgical History:  Procedure Laterality Date    APPENDECTOMY     breast reconstuction     x 2   cataract right     CHOLECYSTECTOMY     COLONOSCOPY     double mastectomy     PARTIAL HYSTERECTOMY  07/26/1988   TIBIA FRACTURE SURGERY Right    with subsequent hardware removal   UPPER GASTROINTESTINAL ENDOSCOPY     WRIST FRACTURE SURGERY Left    Social History   Tobacco Use   Smoking status: Never    Passive exposure: Past   Smokeless tobacco: Never  Vaping Use   Vaping status: Never Used  Substance Use Topics   Alcohol  use: Not Currently   Drug use: Never   Social History   Social History Narrative   1 cup of coffee daily   Are you right handed or left handed? Right   Are you currently employed ?    What is your current occupation? retired   Do you live at home alone? Yes,  son live next door   Who lives with you?    What type of home do you live in: 1 story or 2 story? one    Caffiene 2 - 3 cups coffee     Immunization History  Administered Date(s) Administered   Fluzone Influenza virus vaccine,trivalent (IIV3), split virus 05/07/2010, 05/14/2011, 05/12/2012, 04/19/2013   Influenza Split 04/15/2014   Influenza, Quadrivalent, Recombinant, Inj, Pf 04/22/2018, 04/07/2019, 05/10/2020   Influenza,inj,Quad PF,6+ Mos 04/15/2014, 05/19/2015   Influenza-Unspecified 04/24/2016, 04/23/2017   PFIZER(Purple Top)SARS-COV-2 Vaccination 09/03/2019, 09/28/2019, 06/27/2020   Pneumococcal Conjugate-13 11/20/2013, 12/24/2014   Pneumococcal Polysaccharide-23 09/08/2007, 01/06/2018   Td,absorbed, Preservative Free, Adult Use, Lf Unspecified 04/13/2011   Tdap 10/29/2009   Tetanus 04/03/2022   Unspecified SARS-COV-2 Vaccination 09/03/2019, 09/28/2019   Zoster Recombinant(Shingrix) 01/10/2018, 05/11/2018, 09/19/2018   Zoster, Live 11/20/2013, 01/13/2018, 05/11/2018     Objective: Vital Signs: BP 115/73   Pulse 62   Temp (!) 97.5 F (36.4 C)   Resp 16   Ht 5' 4.5 (1.638 m)   Wt 125 lb (56.7 kg)   BMI 21.12 kg/m    Physical  Exam Vitals and nursing note reviewed.  Constitutional:      Appearance: She is well-developed.  HENT:     Head: Normocephalic and atraumatic.  Eyes:     Conjunctiva/sclera: Conjunctivae normal.  Cardiovascular:     Rate and Rhythm: Normal rate and regular rhythm.     Heart sounds: Normal heart sounds.  Pulmonary:     Effort: Pulmonary effort is normal.     Breath sounds: Normal breath sounds.  Abdominal:     General: Bowel sounds are normal.     Palpations: Abdomen is soft.  Musculoskeletal:     Cervical back: Normal range of motion.  Lymphadenopathy:     Cervical: No cervical adenopathy.  Skin:    General: Skin is warm and dry.     Capillary Refill: Capillary refill takes less than 2 seconds.  Neurological:     Mental Status: She is alert and oriented to person, place, and time.  Psychiatric:        Behavior: Behavior normal.      Musculoskeletal Exam: C-spine has limited range of motion with lateral rotation.  Thoracic spine and lumbar spine have good range of motion.  No midline spinal tenderness.  No SI joint tenderness.  Shoulder joints, elbow joints, wrist joints, MCPs, PIPs, DIPs have good range of motion with no synovitis.  CMC joint, PIP, and DIP thickening consistent with osteoarthritis of both hands. Complete fist formation bilaterally.  Hip joints have good range of motion with no groin pain.  Knee joints have good range of motion no warmth or effusion.  Ankle joints have good range of motion no tenderness or joint swelling.  No evidence of Achilles tendinitis or plantar fasciitis.   CDAI Exam: CDAI Score: -- Patient Global: --; Provider Global: -- Swollen: --; Tender: -- Joint Exam 07/11/2024   No joint exam has been documented for this visit   There is currently no information documented on the homunculus. Go to the Rheumatology activity and complete the homunculus joint exam.  Investigation: No additional findings.  Imaging: No results found.  Recent  Labs: Lab Results  Component Value Date   WBC 5.5 04/05/2024   HGB 11.6 04/05/2024   PLT 165 04/05/2024   NA 134 04/05/2024   K 4.5 04/05/2024   CL 101 04/05/2024   CO2 22 04/05/2024   GLUCOSE 78 04/05/2024  BUN 21 04/05/2024   CREATININE 0.91 04/05/2024   BILITOT 0.5 02/02/2024   ALKPHOS 79 12/13/2017   AST 35 02/02/2024   ALT 19 02/02/2024   PROT 7.1 02/02/2024   ALBUMIN 3.6 12/13/2017   CALCIUM 10.3 04/05/2024   GFRAA 81 10/16/2020    Speciality Comments: PLQ Eye Exam: 04/17/2024 WNL ( Per [facility] difficult to assess due to ARMD. Patient to follow up 12 months).  Procedures:  No procedures performed Allergies: Sulfa antibiotics, Codeine, Glycopyrrolate, and Sulfamethoxazole-trimethoprim   Assessment / Plan:     Visit Diagnoses: Sjogren's syndrome with keratoconjunctivitis sicca - Positive ANA, positive SSA, positive SSB, positive RF, oral ulcers, nasal ulcers, Raynaud's, sicca symptoms, photosensitivity, arthritis: She continues to have chronic sicca symptoms.  She has been following up closely with ophthalmology.  She uses eyedrops daily for symptomatic relief and over-the-counter products for dry mouth. She has been taking Plaquenil  200 mg 1 tablet by mouth daily Monday through Friday.  She is tolerating Plaquenil  without any side effects and has not had any gaps in therapy.  No synovitis was noted on examination today.  She has not noticed any new or worsening symptoms on the reduced dose of Plaquenil . Discussed the risk for developing lymphoma and interstitial lung disease in patients with Sjogren syndrome. Lab work from 02/02/2024 was reviewed today in the office: No proteinuria, ESR within normal limits, complements within normal limits, double-stranded DNA negative Lab work from 06/28/2024 was reviewed today in the office: Total cholesterol 135, triglycerides 65, HDL 49, LDL 73, creatinine 0.8, GFR 68.3, AST 33, ALT 18, white blood cell count 3.6, red blood  cell count 4.1, hemoglobin 12.7, hematocrit 38.0, platelet count 187K, vitamin D  59.9, TSH 3.06, free T41.0, magnesium  2.4.   Plan to obtain the following lab work today for further evaluation.  She will remain on Plaquenil  as prescribed.  She was advised to notify us  if she develops any new or worsening symptoms.  She will follow-up in the office in 5 months or sooner if needed.  - Plan: C3 and C4, Sedimentation rate, Protein / creatinine ratio, urine, ANA, Anti-DNA antibody, double-stranded, Serum protein electrophoresis with reflex  High risk medication use - Plaquenil  200 mg by mouth daily Monday to Friday.   CBC and BMP updated on 06/28/24.  PLQ Eye Exam: 04/17/2024 WNL ( Per [facility] difficult to assess due to ARMD. Patient to follow up 12 months  Raynaud's disease without gangrene: Patient continues to experience intermittent symptoms of Raynaud's phenomenon.  No digital ulcerations or signs of gangrene were noted.  Chronic right shoulder pain: Good ROM with no discomfort currently.   Primary osteoarthritis of both hands: PIP and DIP thickening consistent with osteoarthritis of both hands.  No synovitis noted today.  Discussed the importance of joint protection and muscle strengthening.  Trochanteric bursitis, left hip:Not currently symptomatic.    Primary osteoarthritis of both knees: She has good ROM of both knee joints with no warmth or effusion.    Primary osteoarthritis of both feet: She has good ROM of both ankles joints with no tenderness or joint swelling.   History of foot fracture - Closed fracture of the left calcaneus 11/16/2021  Age-related osteoporosis without current pathological fracture - DEXA 02/01/23: Right total femur T-score -4.3, BMD 0.349.  On Evista.  Patient was to start Tymlos  with her endocrinologist.  She decided against it.  Vitamin D  deficiency: Vitamin D  was 59.9 on 06/28/2024.  Other medical conditions are listed as follows:  History of  hypothyroidism  History of hypertension: Blood pressure was 115/73 today in the office.  History of atrial fibrillation  Chronic anticoagulation  History of gastroesophageal reflux (GERD)/ PUD  History of MRSA infection  Orders: Orders Placed This Encounter  Procedures   C3 and C4   Sedimentation rate   Protein / creatinine ratio, urine   ANA   Anti-DNA antibody, double-stranded   Serum protein electrophoresis with reflex   No orders of the defined types were placed in this encounter.   Follow-Up Instructions: Return in about 5 months (around 12/09/2024) for Sjogren's syndrome, Osteoarthritis.   Waddell CHRISTELLA Craze, PA-C  Note - This record has been created using Dragon software.  Chart creation errors have been sought, but may not always  have been located. Such creation errors do not reflect on  the standard of medical care.

## 2024-06-28 DIAGNOSIS — E785 Hyperlipidemia, unspecified: Secondary | ICD-10-CM | POA: Diagnosis not present

## 2024-06-28 LAB — LAB REPORT - SCANNED
EGFR: 68.3
Free T4: 1 ng/dL
TSH: 3.06 (ref 0.41–5.90)

## 2024-07-11 ENCOUNTER — Ambulatory Visit: Attending: Physician Assistant | Admitting: Physician Assistant

## 2024-07-11 ENCOUNTER — Telehealth: Payer: Self-pay

## 2024-07-11 ENCOUNTER — Encounter: Payer: Self-pay | Admitting: Physician Assistant

## 2024-07-11 VITALS — BP 115/73 | HR 62 | Temp 97.5°F | Resp 16 | Ht 64.5 in | Wt 125.0 lb

## 2024-07-11 DIAGNOSIS — M3501 Sicca syndrome with keratoconjunctivitis: Secondary | ICD-10-CM

## 2024-07-11 DIAGNOSIS — M810 Age-related osteoporosis without current pathological fracture: Secondary | ICD-10-CM | POA: Diagnosis not present

## 2024-07-11 DIAGNOSIS — M19041 Primary osteoarthritis, right hand: Secondary | ICD-10-CM | POA: Diagnosis not present

## 2024-07-11 DIAGNOSIS — E559 Vitamin D deficiency, unspecified: Secondary | ICD-10-CM

## 2024-07-11 DIAGNOSIS — M25511 Pain in right shoulder: Secondary | ICD-10-CM

## 2024-07-11 DIAGNOSIS — Z8614 Personal history of Methicillin resistant Staphylococcus aureus infection: Secondary | ICD-10-CM

## 2024-07-11 DIAGNOSIS — M170 Bilateral primary osteoarthritis of knee: Secondary | ICD-10-CM | POA: Diagnosis not present

## 2024-07-11 DIAGNOSIS — M19071 Primary osteoarthritis, right ankle and foot: Secondary | ICD-10-CM | POA: Diagnosis not present

## 2024-07-11 DIAGNOSIS — Z8719 Personal history of other diseases of the digestive system: Secondary | ICD-10-CM

## 2024-07-11 DIAGNOSIS — Z79899 Other long term (current) drug therapy: Secondary | ICD-10-CM | POA: Diagnosis not present

## 2024-07-11 DIAGNOSIS — M19072 Primary osteoarthritis, left ankle and foot: Secondary | ICD-10-CM

## 2024-07-11 DIAGNOSIS — I7300 Raynaud's syndrome without gangrene: Secondary | ICD-10-CM | POA: Diagnosis not present

## 2024-07-11 DIAGNOSIS — M7062 Trochanteric bursitis, left hip: Secondary | ICD-10-CM

## 2024-07-11 DIAGNOSIS — Z8781 Personal history of (healed) traumatic fracture: Secondary | ICD-10-CM | POA: Diagnosis not present

## 2024-07-11 DIAGNOSIS — Z7901 Long term (current) use of anticoagulants: Secondary | ICD-10-CM

## 2024-07-11 DIAGNOSIS — G8929 Other chronic pain: Secondary | ICD-10-CM

## 2024-07-11 DIAGNOSIS — Z8639 Personal history of other endocrine, nutritional and metabolic disease: Secondary | ICD-10-CM | POA: Diagnosis not present

## 2024-07-11 DIAGNOSIS — M19042 Primary osteoarthritis, left hand: Secondary | ICD-10-CM

## 2024-07-11 DIAGNOSIS — Z8679 Personal history of other diseases of the circulatory system: Secondary | ICD-10-CM

## 2024-07-11 NOTE — Telephone Encounter (Signed)
 Labs received from: [facility]  Drawn on: 06/28/2024  Reviewed by: Waddell Craze, PA-C  Labs drawn: Lipid, CMP, CBC, TSH, Free T4, Vitamin D , Magnesium , Apolipoprotein B  Results: LDL/HDL Ratio 1.5 Calcium 89.3 WBC 3.60 RBC  4.1 MONO% 14.4 NEU% 41.3 Magnesium  2.4

## 2024-07-12 ENCOUNTER — Other Ambulatory Visit: Payer: Self-pay | Admitting: Rheumatology

## 2024-07-12 DIAGNOSIS — M359 Systemic involvement of connective tissue, unspecified: Secondary | ICD-10-CM

## 2024-07-12 DIAGNOSIS — Z79899 Other long term (current) drug therapy: Secondary | ICD-10-CM

## 2024-07-12 DIAGNOSIS — M3501 Sicca syndrome with keratoconjunctivitis: Secondary | ICD-10-CM

## 2024-07-12 NOTE — Telephone Encounter (Signed)
 Last Fill: 06/18/2024 (30 day supply)  Eye exam: 04/17/2024   Labs: 04/05/2024 CBC and BMP: RBC 3.64 07/12/2024 ESR 38 (some results are still pending)   Next Visit: 12/18/2024  Last Visit: 07/11/2024  IK:Dgnhmzw'd syndrome with keratoconjunctivitis sicca   Current Dose per office note on 07/11/2024:  Plaquenil  200 mg by mouth daily Monday to Friday.   Okay to refill Plaquenil ?

## 2024-07-12 NOTE — Telephone Encounter (Signed)
 Last Fill: 06/18/2024 (30 day supply)  Eye exam: 04/17/2024 WNL   Labs: 06/28/2024 LDL/HDL Ratio 1.5 Calcium 89.3 WBC 3.60 RBC  4.1 MONO% 14.4 NEU% 41.3 Magnesium  2.4  Next Visit: 12/18/2024  Last Visit: 07/11/2024  IK:Dgnhmzw'd syndrome with keratoconjunctivitis sicca   Current Dose per office note 07/11/2024: Plaquenil  200 mg by mouth daily Monday to Friday.    Okay to refill Plaquenil ?

## 2024-07-13 ENCOUNTER — Ambulatory Visit: Payer: Self-pay | Admitting: Physician Assistant

## 2024-07-13 LAB — PROTEIN ELECTROPHORESIS, SERUM, WITH REFLEX
Albumin ELP: 3.3 g/dL — ABNORMAL LOW (ref 3.8–4.8)
Alpha 1: 0.4 g/dL — ABNORMAL HIGH (ref 0.2–0.3)
Alpha 2: 0.7 g/dL (ref 0.5–0.9)
Beta 2: 0.5 g/dL (ref 0.2–0.5)
Beta Globulin: 0.4 g/dL (ref 0.4–0.6)
Gamma Globulin: 1.2 g/dL (ref 0.8–1.7)
Total Protein: 6.5 g/dL (ref 6.1–8.1)

## 2024-07-13 LAB — PROTEIN / CREATININE RATIO, URINE
Creatinine, Urine: 54 mg/dL (ref 20–275)
Protein/Creat Ratio: 167 mg/g{creat} (ref 24–184)
Protein/Creatinine Ratio: 0.167 mg/mg{creat} (ref 0.024–0.184)
Total Protein, Urine: 9 mg/dL (ref 5–24)

## 2024-07-13 LAB — C3 AND C4
C3 Complement: 124 mg/dL
C4 Complement: 23 mg/dL

## 2024-07-13 LAB — ANA: Anti Nuclear Antibody (ANA): POSITIVE — AB

## 2024-07-13 LAB — SEDIMENTATION RATE: Sed Rate: 38 mm/h — ABNORMAL HIGH (ref 0–30)

## 2024-07-13 LAB — ANTI-DNA ANTIBODY, DOUBLE-STRANDED: ds DNA Ab: 1 [IU]/mL

## 2024-07-13 LAB — ANTI-NUCLEAR AB-TITER (ANA TITER): ANA Titer 1: 1:320 {titer} — ABNORMAL HIGH

## 2024-07-13 NOTE — Progress Notes (Signed)
 dsDNA is negative. Complements WNL Urine protein creatinine ratio WNL ESR is borderline elevated-may be related to recent covid infection.

## 2024-07-14 NOTE — Progress Notes (Signed)
 ANA remains positive  No change in treatment at this time.

## 2024-07-26 ENCOUNTER — Other Ambulatory Visit: Payer: Self-pay | Admitting: Cardiology

## 2024-07-26 DIAGNOSIS — I4891 Unspecified atrial fibrillation: Secondary | ICD-10-CM

## 2024-07-27 NOTE — Telephone Encounter (Signed)
 Eliquis  2.5mg  refill request received. Patient is 84 years old, weight-56.7kg, Crea-0.91 on 04/05/24, Diagnosis-Afib, and last seen by Dayna Dunn on 03/29/24. Dose is appropriate based on dosing criteria. Will send in refill to requested pharmacy.

## 2024-08-07 ENCOUNTER — Other Ambulatory Visit (HOSPITAL_COMMUNITY): Payer: Self-pay | Admitting: *Deleted

## 2024-08-07 MED ORDER — SOTALOL HCL 80 MG PO TABS: 80.0000 mg | ORAL_TABLET | Freq: Every day | ORAL | 5 refills | Status: AC

## 2024-12-10 ENCOUNTER — Ambulatory Visit (HOSPITAL_COMMUNITY): Admitting: Internal Medicine

## 2024-12-18 ENCOUNTER — Ambulatory Visit: Admitting: Rheumatology
